# Patient Record
Sex: Female | Born: 1985 | Race: Black or African American | Hispanic: No | Marital: Single | Smoking: Current every day smoker
Health system: Southern US, Community
[De-identification: ages and names within clinical notes are randomized; demographics above are authoritative.]

## PROBLEM LIST (undated history)

## (undated) ENCOUNTER — Inpatient Hospital Stay (HOSPITAL_COMMUNITY): Payer: Self-pay

## (undated) ENCOUNTER — Inpatient Hospital Stay: Admission: EM | Payer: Self-pay | Source: Home / Self Care

## (undated) DIAGNOSIS — R51 Headache: Secondary | ICD-10-CM

## (undated) DIAGNOSIS — N390 Urinary tract infection, site not specified: Secondary | ICD-10-CM

## (undated) DIAGNOSIS — D573 Sickle-cell trait: Secondary | ICD-10-CM

## (undated) DIAGNOSIS — K429 Umbilical hernia without obstruction or gangrene: Secondary | ICD-10-CM

## (undated) DIAGNOSIS — O139 Gestational [pregnancy-induced] hypertension without significant proteinuria, unspecified trimester: Secondary | ICD-10-CM

## (undated) DIAGNOSIS — A599 Trichomoniasis, unspecified: Secondary | ICD-10-CM

## (undated) DIAGNOSIS — K219 Gastro-esophageal reflux disease without esophagitis: Secondary | ICD-10-CM

## (undated) DIAGNOSIS — A749 Chlamydial infection, unspecified: Secondary | ICD-10-CM

## (undated) DIAGNOSIS — O24419 Gestational diabetes mellitus in pregnancy, unspecified control: Secondary | ICD-10-CM

## (undated) HISTORY — PX: ABDOMINAL HYSTERECTOMY: SHX81

## (undated) HISTORY — PX: EXPLORATORY LAPAROTOMY: SUR591

## (undated) HISTORY — PX: ECTOPIC PREGNANCY SURGERY: SHX613

---
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
## 2004-03-25 ENCOUNTER — Emergency Department (HOSPITAL_COMMUNITY): Admission: EM | Admit: 2004-03-25 | Discharge: 2004-03-26 | Payer: Self-pay | Admitting: Emergency Medicine

## 2004-10-25 ENCOUNTER — Emergency Department (HOSPITAL_COMMUNITY): Admission: EM | Admit: 2004-10-25 | Discharge: 2004-10-25 | Payer: Self-pay | Admitting: Emergency Medicine

## 2005-05-09 ENCOUNTER — Emergency Department (HOSPITAL_COMMUNITY): Admission: EM | Admit: 2005-05-09 | Discharge: 2005-05-10 | Payer: Self-pay | Admitting: Emergency Medicine

## 2005-06-03 ENCOUNTER — Emergency Department (HOSPITAL_COMMUNITY): Admission: EM | Admit: 2005-06-03 | Discharge: 2005-06-04 | Payer: Self-pay | Admitting: Emergency Medicine

## 2005-06-11 ENCOUNTER — Emergency Department (HOSPITAL_COMMUNITY): Admission: EM | Admit: 2005-06-11 | Discharge: 2005-06-12 | Payer: Self-pay | Admitting: Emergency Medicine

## 2005-07-15 ENCOUNTER — Other Ambulatory Visit: Admission: RE | Admit: 2005-07-15 | Discharge: 2005-07-15 | Payer: Self-pay | Admitting: Obstetrics & Gynecology

## 2005-09-19 ENCOUNTER — Inpatient Hospital Stay (HOSPITAL_COMMUNITY): Admission: AD | Admit: 2005-09-19 | Discharge: 2005-09-19 | Payer: Self-pay | Admitting: Obstetrics and Gynecology

## 2005-11-16 ENCOUNTER — Inpatient Hospital Stay (HOSPITAL_COMMUNITY): Admission: AD | Admit: 2005-11-16 | Discharge: 2005-11-17 | Payer: Self-pay | Admitting: *Deleted

## 2005-11-19 ENCOUNTER — Inpatient Hospital Stay (HOSPITAL_COMMUNITY): Admission: AD | Admit: 2005-11-19 | Discharge: 2005-11-19 | Payer: Self-pay | Admitting: Obstetrics and Gynecology

## 2005-12-05 ENCOUNTER — Inpatient Hospital Stay (HOSPITAL_COMMUNITY): Admission: AD | Admit: 2005-12-05 | Discharge: 2005-12-05 | Payer: Self-pay | Admitting: Obstetrics and Gynecology

## 2006-01-11 ENCOUNTER — Inpatient Hospital Stay (HOSPITAL_COMMUNITY): Admission: AD | Admit: 2006-01-11 | Discharge: 2006-01-11 | Payer: Self-pay | Admitting: Obstetrics & Gynecology

## 2006-01-21 ENCOUNTER — Inpatient Hospital Stay (HOSPITAL_COMMUNITY): Admission: AD | Admit: 2006-01-21 | Discharge: 2006-01-21 | Payer: Self-pay | Admitting: Obstetrics & Gynecology

## 2006-01-31 ENCOUNTER — Inpatient Hospital Stay (HOSPITAL_COMMUNITY): Admission: AD | Admit: 2006-01-31 | Discharge: 2006-02-02 | Payer: Self-pay | Admitting: Obstetrics and Gynecology

## 2006-04-11 ENCOUNTER — Emergency Department (HOSPITAL_COMMUNITY): Admission: EM | Admit: 2006-04-11 | Discharge: 2006-04-11 | Payer: Self-pay | Admitting: Emergency Medicine

## 2006-04-21 ENCOUNTER — Emergency Department (HOSPITAL_COMMUNITY): Admission: EM | Admit: 2006-04-21 | Discharge: 2006-04-21 | Payer: Self-pay | Admitting: Emergency Medicine

## 2006-07-20 ENCOUNTER — Emergency Department (HOSPITAL_COMMUNITY): Admission: EM | Admit: 2006-07-20 | Discharge: 2006-07-21 | Payer: Self-pay | Admitting: Emergency Medicine

## 2006-10-04 ENCOUNTER — Emergency Department (HOSPITAL_COMMUNITY): Admission: EM | Admit: 2006-10-04 | Discharge: 2006-10-04 | Payer: Self-pay | Admitting: Emergency Medicine

## 2007-04-07 ENCOUNTER — Emergency Department (HOSPITAL_COMMUNITY): Admission: EM | Admit: 2007-04-07 | Discharge: 2007-04-07 | Payer: Self-pay | Admitting: Emergency Medicine

## 2007-06-06 ENCOUNTER — Emergency Department (HOSPITAL_COMMUNITY): Admission: EM | Admit: 2007-06-06 | Discharge: 2007-06-07 | Payer: Self-pay | Admitting: Emergency Medicine

## 2007-07-06 ENCOUNTER — Emergency Department (HOSPITAL_COMMUNITY): Admission: EM | Admit: 2007-07-06 | Discharge: 2007-07-06 | Payer: Self-pay | Admitting: Emergency Medicine

## 2007-11-17 ENCOUNTER — Emergency Department (HOSPITAL_COMMUNITY): Admission: EM | Admit: 2007-11-17 | Discharge: 2007-11-17 | Payer: Self-pay | Admitting: Emergency Medicine

## 2008-01-08 ENCOUNTER — Emergency Department (HOSPITAL_COMMUNITY): Admission: EM | Admit: 2008-01-08 | Discharge: 2008-01-08 | Payer: Self-pay | Admitting: Emergency Medicine

## 2008-06-22 ENCOUNTER — Emergency Department (HOSPITAL_COMMUNITY): Admission: EM | Admit: 2008-06-22 | Discharge: 2008-06-22 | Payer: Self-pay | Admitting: Emergency Medicine

## 2008-09-27 ENCOUNTER — Emergency Department (HOSPITAL_COMMUNITY): Admission: EM | Admit: 2008-09-27 | Discharge: 2008-09-27 | Payer: Self-pay | Admitting: Emergency Medicine

## 2009-02-08 ENCOUNTER — Emergency Department (HOSPITAL_COMMUNITY): Admission: EM | Admit: 2009-02-08 | Discharge: 2009-02-08 | Payer: Self-pay | Admitting: Emergency Medicine

## 2009-07-31 ENCOUNTER — Emergency Department (HOSPITAL_COMMUNITY): Admission: EM | Admit: 2009-07-31 | Discharge: 2009-07-31 | Payer: Self-pay | Admitting: Family Medicine

## 2009-08-03 ENCOUNTER — Emergency Department (HOSPITAL_COMMUNITY): Admission: EM | Admit: 2009-08-03 | Discharge: 2009-08-04 | Payer: Self-pay | Admitting: Emergency Medicine

## 2009-11-06 ENCOUNTER — Emergency Department (HOSPITAL_COMMUNITY): Admission: EM | Admit: 2009-11-06 | Discharge: 2009-11-06 | Payer: Self-pay | Admitting: Emergency Medicine

## 2010-02-11 ENCOUNTER — Emergency Department (HOSPITAL_COMMUNITY): Admission: EM | Admit: 2010-02-11 | Discharge: 2010-02-12 | Payer: Self-pay | Admitting: Emergency Medicine

## 2010-02-12 ENCOUNTER — Ambulatory Visit (HOSPITAL_COMMUNITY): Admission: AD | Admit: 2010-02-12 | Discharge: 2010-02-13 | Payer: Self-pay | Admitting: Obstetrics & Gynecology

## 2010-02-12 ENCOUNTER — Encounter (INDEPENDENT_AMBULATORY_CARE_PROVIDER_SITE_OTHER): Payer: Self-pay | Admitting: Obstetrics and Gynecology

## 2010-02-12 ENCOUNTER — Ambulatory Visit: Payer: Self-pay | Admitting: Nurse Practitioner

## 2010-06-13 ENCOUNTER — Inpatient Hospital Stay (HOSPITAL_COMMUNITY)
Admission: AD | Admit: 2010-06-13 | Discharge: 2010-06-14 | Payer: Self-pay | Source: Home / Self Care | Attending: Obstetrics & Gynecology | Admitting: Obstetrics & Gynecology

## 2010-06-15 ENCOUNTER — Inpatient Hospital Stay (HOSPITAL_COMMUNITY)
Admission: AD | Admit: 2010-06-15 | Discharge: 2010-06-15 | Payer: Self-pay | Source: Home / Self Care | Attending: Obstetrics and Gynecology | Admitting: Obstetrics and Gynecology

## 2010-06-18 ENCOUNTER — Inpatient Hospital Stay (HOSPITAL_COMMUNITY)
Admission: AD | Admit: 2010-06-18 | Discharge: 2010-06-18 | Payer: Self-pay | Source: Home / Self Care | Attending: Obstetrics & Gynecology | Admitting: Obstetrics & Gynecology

## 2010-06-20 ENCOUNTER — Inpatient Hospital Stay (HOSPITAL_COMMUNITY)
Admission: RE | Admit: 2010-06-20 | Discharge: 2010-06-20 | Payer: Self-pay | Source: Home / Self Care | Attending: Obstetrics & Gynecology | Admitting: Obstetrics & Gynecology

## 2010-06-24 NOTE — L&D Delivery Note (Signed)
Delivery Note At 2:27 PM a viable and healthy female was delivered via Vaginal, Spontaneous Delivery (Presentation: Left Occiput Anterior).  APGAR: 9, 9; weight 8 lb 6.6 oz (3816 g).   Placenta status: Intact, Spontaneous.  Cord: 3 vessels.  Anesthesia: Epidural  Episiotomy: None Lacerations: None Est. Blood Loss (mL): 300  Mom to postpartum.  Baby to nursery-stable.  Leaira Fullam D 02/13/2011, 5:00 PM

## 2010-06-27 ENCOUNTER — Inpatient Hospital Stay (HOSPITAL_COMMUNITY)
Admission: AD | Admit: 2010-06-27 | Discharge: 2010-06-27 | Payer: Self-pay | Source: Home / Self Care | Attending: Obstetrics & Gynecology | Admitting: Obstetrics & Gynecology

## 2010-06-27 LAB — URINALYSIS, ROUTINE W REFLEX MICROSCOPIC
Bilirubin Urine: NEGATIVE
Hemoglobin, Urine: NEGATIVE
Ketones, ur: NEGATIVE mg/dL
Nitrite: NEGATIVE
Protein, ur: NEGATIVE mg/dL
Specific Gravity, Urine: 1.02 (ref 1.005–1.030)
Urine Glucose, Fasting: NEGATIVE mg/dL
Urobilinogen, UA: 0.2 mg/dL (ref 0.0–1.0)
pH: 6 (ref 5.0–8.0)

## 2010-06-27 LAB — URINE MICROSCOPIC-ADD ON

## 2010-07-26 ENCOUNTER — Inpatient Hospital Stay (HOSPITAL_COMMUNITY)
Admission: AD | Admit: 2010-07-26 | Discharge: 2010-07-26 | Disposition: A | Discharge status: Home / Self Care | Payer: Medicaid Other | Source: Ambulatory Visit | Attending: Family Medicine | Admitting: Family Medicine

## 2010-07-26 DIAGNOSIS — R109 Unspecified abdominal pain: Secondary | ICD-10-CM | POA: Insufficient documentation

## 2010-07-26 DIAGNOSIS — O99891 Other specified diseases and conditions complicating pregnancy: Secondary | ICD-10-CM | POA: Insufficient documentation

## 2010-07-26 DIAGNOSIS — E860 Dehydration: Secondary | ICD-10-CM

## 2010-07-26 DIAGNOSIS — O9989 Other specified diseases and conditions complicating pregnancy, childbirth and the puerperium: Secondary | ICD-10-CM

## 2010-07-26 LAB — URINALYSIS, ROUTINE W REFLEX MICROSCOPIC
Bilirubin Urine: NEGATIVE
Hgb urine dipstick: NEGATIVE
Ketones, ur: 15 mg/dL — AB
Nitrite: NEGATIVE
Protein, ur: NEGATIVE mg/dL
Specific Gravity, Urine: 1.02 (ref 1.005–1.030)
Urine Glucose, Fasting: NEGATIVE mg/dL
Urobilinogen, UA: 1 mg/dL (ref 0.0–1.0)
pH: 7 (ref 5.0–8.0)

## 2010-07-26 LAB — URINE MICROSCOPIC-ADD ON

## 2010-07-26 LAB — WET PREP, GENITAL
Clue Cells Wet Prep HPF POC: NONE SEEN
Trich, Wet Prep: NONE SEEN
Yeast Wet Prep HPF POC: NONE SEEN

## 2010-07-27 LAB — URINE CULTURE
Colony Count: >=100000
Culture  Setup Time: 201202021501

## 2010-07-27 LAB — GC/CHLAMYDIA PROBE AMP, GENITAL
Chlamydia, DNA Probe: NEGATIVE
GC Probe Amp, Genital: NEGATIVE

## 2010-08-13 ENCOUNTER — Inpatient Hospital Stay (HOSPITAL_COMMUNITY)
Admission: AD | Admit: 2010-08-13 | Discharge: 2010-08-13 | Disposition: A | Discharge status: Home / Self Care | Payer: Medicaid Other | Source: Ambulatory Visit | Attending: Obstetrics and Gynecology | Admitting: Obstetrics and Gynecology

## 2010-08-13 DIAGNOSIS — O99891 Other specified diseases and conditions complicating pregnancy: Secondary | ICD-10-CM | POA: Insufficient documentation

## 2010-08-13 DIAGNOSIS — O9989 Other specified diseases and conditions complicating pregnancy, childbirth and the puerperium: Secondary | ICD-10-CM

## 2010-08-13 DIAGNOSIS — R109 Unspecified abdominal pain: Secondary | ICD-10-CM

## 2010-08-13 LAB — URINALYSIS, ROUTINE W REFLEX MICROSCOPIC
Bilirubin Urine: NEGATIVE
Hgb urine dipstick: NEGATIVE
Ketones, ur: NEGATIVE mg/dL
Nitrite: NEGATIVE
Protein, ur: NEGATIVE mg/dL
Specific Gravity, Urine: >1.03 — ABNORMAL HIGH (ref 1.005–1.030)
Urine Glucose, Fasting: NEGATIVE mg/dL
Urobilinogen, UA: 0.2 mg/dL (ref 0.0–1.0)
pH: 6 (ref 5.0–8.0)

## 2010-08-13 LAB — COMPREHENSIVE METABOLIC PANEL
ALT: 9 U/L (ref 0–35)
AST: 14 U/L (ref 0–37)
Albumin: 3.4 g/dL — ABNORMAL LOW (ref 3.5–5.2)
Alkaline Phosphatase: 44 U/L (ref 39–117)
BUN: 9 mg/dL (ref 6–23)
CO2: 23 mEq/L (ref 19–32)
Calcium: 9.1 mg/dL (ref 8.4–10.5)
Chloride: 106 mEq/L (ref 96–112)
Creatinine, Ser: 0.62 mg/dL (ref 0.4–1.2)
GFR calc Af Amer: >60 mL/min (ref >60)
GFR calc non Af Amer: >60 mL/min (ref >60)
Glucose, Bld: 104 mg/dL — ABNORMAL HIGH (ref 70–99)
Potassium: 3.5 mEq/L (ref 3.5–5.1)
Sodium: 135 mEq/L (ref 135–145)
Total Bilirubin: 0.4 mg/dL (ref 0.3–1.2)
Total Protein: 6.6 g/dL (ref 6.0–8.3)

## 2010-08-13 LAB — AMYLASE: Amylase: 55 U/L (ref 0–105)

## 2010-08-13 LAB — CBC
HCT: 34.5 % — ABNORMAL LOW (ref 36.0–46.0)
Hemoglobin: 12.6 g/dL (ref 12.0–15.0)
WBC: 9.8 10*3/uL (ref 4.0–10.5)

## 2010-08-13 LAB — LIPASE, BLOOD: Lipase: 19 U/L (ref 11–59)

## 2010-09-03 LAB — CBC
HCT: 33.4 % — ABNORMAL LOW (ref 36.0–46.0)
Hemoglobin: 12.4 g/dL (ref 12.0–15.0)
MCHC: 37.1 g/dL — ABNORMAL HIGH (ref 30.0–36.0)

## 2010-09-03 LAB — DIFFERENTIAL
Basophils Relative: 0 % (ref 0–1)
Eosinophils Absolute: 0 10*3/uL (ref 0.0–0.7)
Eosinophils Relative: 0 % (ref 0–5)
Lymphocytes Relative: 40 % (ref 12–46)
Neutro Abs: 5.4 10*3/uL (ref 1.7–7.7)

## 2010-09-03 LAB — URINALYSIS, ROUTINE W REFLEX MICROSCOPIC
Glucose, UA: NEGATIVE mg/dL
Hgb urine dipstick: NEGATIVE
Specific Gravity, Urine: 1.025 (ref 1.005–1.030)
Urobilinogen, UA: 1 mg/dL (ref 0.0–1.0)

## 2010-09-03 LAB — HCG, QUANTITATIVE, PREGNANCY: hCG, Beta Chain, Quant, S: 3850 m[IU]/mL — ABNORMAL HIGH (ref <5)

## 2010-09-03 LAB — POCT PREGNANCY, URINE: Preg Test, Ur: POSITIVE

## 2010-09-03 LAB — GC/CHLAMYDIA PROBE AMP, GENITAL: GC Probe Amp, Genital: NEGATIVE

## 2010-09-03 LAB — WET PREP, GENITAL
Trich, Wet Prep: NONE SEEN
Yeast Wet Prep HPF POC: NONE SEEN

## 2010-09-03 LAB — ABO/RH: ABO/RH(D): O POS

## 2010-09-06 LAB — ABO/RH: ABO/RH(D): O POS

## 2010-09-06 LAB — WET PREP, GENITAL: Trich, Wet Prep: NONE SEEN

## 2010-09-06 LAB — CBC: Platelets: 194 10*3/uL (ref 150–400)

## 2010-09-06 LAB — HCG, QUANTITATIVE, PREGNANCY: hCG, Beta Chain, Quant, S: 468 m[IU]/mL — ABNORMAL HIGH (ref <5)

## 2010-09-06 LAB — BASIC METABOLIC PANEL
BUN: 8 mg/dL (ref 6–23)
CO2: 18 mEq/L — ABNORMAL LOW (ref 19–32)
Calcium: 8.8 mg/dL (ref 8.4–10.5)
Creatinine, Ser: 0.64 mg/dL (ref 0.4–1.2)
Glucose, Bld: 84 mg/dL (ref 70–99)

## 2010-09-06 LAB — URINALYSIS, ROUTINE W REFLEX MICROSCOPIC
Hgb urine dipstick: NEGATIVE
Nitrite: NEGATIVE
Specific Gravity, Urine: 1.029 (ref 1.005–1.030)
Urobilinogen, UA: 1 mg/dL (ref 0.0–1.0)

## 2010-09-06 LAB — DIFFERENTIAL
Basophils Relative: 0 % (ref 0–1)
Eosinophils Absolute: 0 10*3/uL (ref 0.0–0.7)
Eosinophils Relative: 0 % (ref 0–5)
Neutrophils Relative %: 64 % (ref 43–77)

## 2010-09-06 LAB — URINE MICROSCOPIC-ADD ON

## 2010-09-06 LAB — POCT PREGNANCY, URINE: Preg Test, Ur: POSITIVE

## 2010-09-07 LAB — CBC
MCH: 31.2 pg (ref 26.0–34.0)
MCH: 31.9 pg (ref 26.0–34.0)
MCHC: 35.1 g/dL (ref 30.0–36.0)
MCV: 92.7 fL (ref 78.0–100.0)
Platelets: 175 10*3/uL (ref 150–400)
Platelets: 188 10*3/uL (ref 150–400)
RBC: 3.97 MIL/uL (ref 3.87–5.11)
RBC: 4.32 MIL/uL (ref 3.87–5.11)
RDW: 13.2 % (ref 11.5–15.5)
RDW: 13.2 % (ref 11.5–15.5)

## 2010-09-07 LAB — HCG, QUANTITATIVE, PREGNANCY: hCG, Beta Chain, Quant, S: 865 m[IU]/mL — ABNORMAL HIGH (ref <5)

## 2010-09-07 LAB — TYPE AND SCREEN
ABO/RH(D): O POS
Antibody Screen: NEGATIVE

## 2010-09-10 LAB — WET PREP, GENITAL: Trich, Wet Prep: NONE SEEN

## 2010-09-10 LAB — CBC
HCT: 43.3 % (ref 36.0–46.0)
Platelets: 192 10*3/uL (ref 150–400)
RDW: 13.2 % (ref 11.5–15.5)
WBC: 7.2 10*3/uL (ref 4.0–10.5)

## 2010-09-10 LAB — GC/CHLAMYDIA PROBE AMP, GENITAL
Chlamydia, DNA Probe: NEGATIVE
GC Probe Amp, Genital: NEGATIVE

## 2010-09-10 LAB — ABO/RH: ABO/RH(D): O POS

## 2010-09-10 LAB — URINALYSIS, ROUTINE W REFLEX MICROSCOPIC
Bilirubin Urine: NEGATIVE
Nitrite: NEGATIVE
Protein, ur: NEGATIVE mg/dL
Specific Gravity, Urine: 1.014 (ref 1.005–1.030)
Urobilinogen, UA: 1 mg/dL (ref 0.0–1.0)

## 2010-09-10 LAB — DIFFERENTIAL
Basophils Absolute: 0 10*3/uL (ref 0.0–0.1)
Lymphocytes Relative: 34 % (ref 12–46)
Lymphs Abs: 2.4 10*3/uL (ref 0.7–4.0)
Neutro Abs: 4.3 10*3/uL (ref 1.7–7.7)
Neutrophils Relative %: 60 % (ref 43–77)

## 2010-09-10 LAB — POCT I-STAT, CHEM 8
BUN: 3 mg/dL — ABNORMAL LOW (ref 6–23)
Chloride: 109 mEq/L (ref 96–112)
HCT: 46 % (ref 36.0–46.0)
Potassium: 3.9 mEq/L (ref 3.5–5.1)
Sodium: 141 mEq/L (ref 135–145)

## 2010-09-10 LAB — URINE MICROSCOPIC-ADD ON

## 2010-09-12 LAB — CBC
HCT: 45.5 % (ref 36.0–46.0)
Hemoglobin: 16 g/dL — ABNORMAL HIGH (ref 12.0–15.0)
Hemoglobin: 17 g/dL — ABNORMAL HIGH (ref 12.0–15.0)
MCHC: 34.5 g/dL (ref 30.0–36.0)
MCV: 89.5 fL (ref 78.0–100.0)
Platelets: 209 10*3/uL (ref 150–400)
RBC: 5.08 MIL/uL (ref 3.87–5.11)
RBC: 5.31 MIL/uL — ABNORMAL HIGH (ref 3.87–5.11)
WBC: 8.9 10*3/uL (ref 4.0–10.5)
WBC: 9.7 10*3/uL (ref 4.0–10.5)

## 2010-09-12 LAB — POCT URINALYSIS DIP (DEVICE)
Bilirubin Urine: NEGATIVE
Glucose, UA: NEGATIVE mg/dL
Ketones, ur: NEGATIVE mg/dL
Nitrite: NEGATIVE
pH: 7.5 (ref 5.0–8.0)

## 2010-09-12 LAB — DIFFERENTIAL
Lymphocytes Relative: 36 % (ref 12–46)
Lymphs Abs: 3.2 10*3/uL (ref 0.7–4.0)
Monocytes Absolute: 0.5 10*3/uL (ref 0.1–1.0)
Monocytes Relative: 6 % (ref 3–12)
Neutro Abs: 5.1 10*3/uL (ref 1.7–7.7)
Neutrophils Relative %: 57 % (ref 43–77)

## 2010-09-12 LAB — GC/CHLAMYDIA PROBE AMP, GENITAL: Chlamydia, DNA Probe: NEGATIVE

## 2010-09-14 ENCOUNTER — Other Ambulatory Visit: Payer: Self-pay | Admitting: Obstetrics & Gynecology

## 2010-09-14 LAB — ABO/RH: RH Type: POSITIVE

## 2010-09-14 LAB — HIV ANTIBODY (ROUTINE TESTING W REFLEX): HIV: NONREACTIVE

## 2010-10-03 LAB — URINE MICROSCOPIC-ADD ON

## 2010-10-03 LAB — POCT I-STAT, CHEM 8
Calcium, Ion: 1.19 mmol/L (ref 1.12–1.32)
Chloride: 107 mEq/L (ref 96–112)
Glucose, Bld: 84 mg/dL (ref 70–99)
HCT: 45 % (ref 36.0–46.0)
TCO2: 23 mmol/L (ref 0–100)

## 2010-10-03 LAB — URINALYSIS, ROUTINE W REFLEX MICROSCOPIC
Bilirubin Urine: NEGATIVE
Glucose, UA: NEGATIVE mg/dL
Hgb urine dipstick: NEGATIVE
Nitrite: NEGATIVE
Protein, ur: NEGATIVE mg/dL
Specific Gravity, Urine: 1.015 (ref 1.005–1.030)
pH: 7 (ref 5.0–8.0)

## 2010-12-11 ENCOUNTER — Inpatient Hospital Stay (HOSPITAL_COMMUNITY)
Admission: AD | Admit: 2010-12-11 | Discharge: 2010-12-12 | Disposition: A | Discharge status: Home / Self Care | Payer: Medicaid Other | Source: Ambulatory Visit | Attending: Obstetrics and Gynecology | Admitting: Obstetrics and Gynecology

## 2010-12-11 DIAGNOSIS — O4700 False labor before 37 completed weeks of gestation, unspecified trimester: Secondary | ICD-10-CM | POA: Insufficient documentation

## 2010-12-12 LAB — URINALYSIS, ROUTINE W REFLEX MICROSCOPIC
Bilirubin Urine: NEGATIVE
Glucose, UA: NEGATIVE mg/dL
Ketones, ur: NEGATIVE mg/dL
Protein, ur: NEGATIVE mg/dL
pH: 6.5 (ref 5.0–8.0)

## 2010-12-19 ENCOUNTER — Encounter: Payer: Medicaid Other | Attending: Obstetrics and Gynecology

## 2011-01-07 ENCOUNTER — Emergency Department (HOSPITAL_COMMUNITY)
Admission: EM | Admit: 2011-01-07 | Discharge: 2011-01-07 | Disposition: A | Discharge status: Home / Self Care | Payer: Medicaid Other | Attending: Emergency Medicine | Admitting: Emergency Medicine

## 2011-01-07 DIAGNOSIS — O9989 Other specified diseases and conditions complicating pregnancy, childbirth and the puerperium: Secondary | ICD-10-CM | POA: Insufficient documentation

## 2011-01-07 DIAGNOSIS — J45909 Unspecified asthma, uncomplicated: Secondary | ICD-10-CM | POA: Insufficient documentation

## 2011-01-09 ENCOUNTER — Encounter: Payer: Medicaid Other | Attending: Obstetrics and Gynecology

## 2011-01-09 DIAGNOSIS — O99810 Abnormal glucose complicating pregnancy: Secondary | ICD-10-CM | POA: Insufficient documentation

## 2011-01-09 DIAGNOSIS — Z713 Dietary counseling and surveillance: Secondary | ICD-10-CM | POA: Insufficient documentation

## 2011-01-09 NOTE — Progress Notes (Signed)
  Patient was seen on 01/09/2011 for Gestational Diabetes self-management class at the [facility]. The following learning objectives were met by the patient during this course:   States the definition of Gestational Diabetes  States why dietary management is important in controlling blood glucose  Describes the effects each nutrient has on blood glucose levels  Demonstrates ability to create a balanced meal plan  Demonstrates carbohydrate counting   States when to check blood glucose levels  Demonstrates proper blood glucose monitoring techniques  States the effect of stress and exercise on blood glucose levels  States the importance of limiting caffeine and abstaining from alcohol and smoking  Blood glucose monitor given: Accu-Chek Nano Lot # [id number] Exp: 04/24/2011 Blood glucose reading: 104 at 10:50 a.m.  Patient instructed to monitor glucose levels: FBS: 60 - <90 1 hour: <140 2 hour: <120  Patient will be seen for follow-up as needed.

## 2011-02-03 ENCOUNTER — Inpatient Hospital Stay (HOSPITAL_COMMUNITY)
Admission: AD | Admit: 2011-02-03 | Discharge: 2011-02-03 | Disposition: A | Discharge status: Home / Self Care | Payer: Medicaid Other | Source: Ambulatory Visit | Attending: Obstetrics and Gynecology | Admitting: Obstetrics and Gynecology

## 2011-02-03 ENCOUNTER — Encounter (HOSPITAL_COMMUNITY): Payer: Self-pay

## 2011-02-03 DIAGNOSIS — O479 False labor, unspecified: Secondary | ICD-10-CM | POA: Insufficient documentation

## 2011-02-03 HISTORY — DX: Gestational diabetes mellitus in pregnancy, unspecified control: O24.419

## 2011-02-03 HISTORY — DX: Umbilical hernia without obstruction or gangrene: K42.9

## 2011-02-03 NOTE — Progress Notes (Signed)
Lost mucus plug a couple of nights ago, thinks started leaking fluid intermittently, having sharp pains like cramps and lower back pain x 3 days

## 2011-02-03 NOTE — ED Provider Notes (Signed)
Chief Complaint:  Contractions and Rupture of Membranes   Christy Jones is  25 y.o. [id number].  No LMP recorded. Patient is pregnant..  Her pregnancy status is positive.  She presents complaining of Contractions and Rupture of Membranes . Onset is described as leaking fluid for 4 days, now having contratcions    Past Medical History  Diagnosis Date  . Asthma   . Umbilical hernia   . Gestational diabetes     Past Surgical History  Procedure Date  . Exploratory laparotomy     removal of ectopic pregnancy    No family history on file.  History  Substance Use Topics  . Smoking status: Current Some Day Smoker  . Smokeless tobacco: Not on file  . Alcohol Use: No    Allergies: Allergies not on file  No prescriptions prior to admission    Review of Systems - Negative except stated in chief complaint  Physical Exam   Blood pressure 113/79, pulse 90, temperature 98.7 F (37.1 C), temperature source Oral, resp. rate 16, height 5\' 4"  (1.626 m), weight 79.924 kg (176 lb 3.2 oz).  General: General appearance - alert, well appearing, and in no distress, oriented to person, place, and time and well hydrated Focused Gynecological Exam: normal external genitalia, vulva, vagina, cervix, uterus and adnexa, No pooling, negative valsalva.  WET MOUNT done - results: negative for pathogens, normal epithelial cells; FERN negative CX 3/50/-2 (no change per pt) FHR 145, avg LTV, frequent accels, no decels.  Mild and irregular ctx, q 3-8 minutes Labs: No results found for this or any previous visit (from the past 24 hour(s)). Imaging Studies:  No results found.   Assessment: Negative ROM, normal appearing discharge.  Braxton Hicks contractions  Plan: D/C home  CRESENZO-DISHMAN,Carolynne Schuchard

## 2011-02-03 NOTE — Progress Notes (Signed)
No pooling noted by CNM.

## 2011-02-03 NOTE — Progress Notes (Signed)
nottified of neg fern and SVE. OK to d/c home.

## 2011-02-11 ENCOUNTER — Encounter (HOSPITAL_COMMUNITY): Payer: Self-pay | Admitting: *Deleted

## 2011-02-11 ENCOUNTER — Telehealth (HOSPITAL_COMMUNITY): Payer: Self-pay | Admitting: *Deleted

## 2011-02-11 NOTE — Telephone Encounter (Signed)
pread screen 

## 2011-02-12 ENCOUNTER — Telehealth (HOSPITAL_COMMUNITY): Payer: Self-pay | Admitting: *Deleted

## 2011-02-12 ENCOUNTER — Other Ambulatory Visit: Payer: Self-pay | Admitting: Obstetrics & Gynecology

## 2011-02-13 ENCOUNTER — Inpatient Hospital Stay (HOSPITAL_COMMUNITY)
Admission: RE | Admit: 2011-02-13 | Discharge: 2011-02-15 | DRG: 775 | Disposition: A | Discharge status: Home / Self Care | Payer: Medicaid Other | Source: Ambulatory Visit | Attending: Obstetrics & Gynecology | Admitting: Obstetrics & Gynecology

## 2011-02-13 ENCOUNTER — Encounter (HOSPITAL_COMMUNITY): Payer: Self-pay

## 2011-02-13 ENCOUNTER — Inpatient Hospital Stay (HOSPITAL_COMMUNITY): Payer: Medicaid Other | Admitting: Anesthesiology

## 2011-02-13 ENCOUNTER — Encounter (HOSPITAL_COMMUNITY): Payer: Self-pay | Admitting: Anesthesiology

## 2011-02-13 DIAGNOSIS — O99814 Abnormal glucose complicating childbirth: Principal | ICD-10-CM | POA: Diagnosis present

## 2011-02-13 DIAGNOSIS — Z331 Pregnant state, incidental: Secondary | ICD-10-CM

## 2011-02-13 LAB — CBC
HCT: 30 % — ABNORMAL LOW (ref 36.0–46.0)
Hemoglobin: 10.9 g/dL — ABNORMAL LOW (ref 12.0–15.0)
MCH: 29.5 pg (ref 26.0–34.0)
MCHC: 36.3 g/dL — ABNORMAL HIGH (ref 30.0–36.0)

## 2011-02-13 MED ORDER — LIDOCAINE HCL 1.5 % IJ SOLN
INTRAMUSCULAR | Status: DC | PRN
  Administered 2011-02-13 (×2): 5 mL via EPIDURAL

## 2011-02-13 MED ORDER — OXYTOCIN 20 UNITS IN LACTATED RINGERS INFUSION - SIMPLE: 125.0000 mL/h | INTRAVENOUS | Status: DC

## 2011-02-13 MED ORDER — ONDANSETRON HCL 4 MG/2ML IJ SOLN: 4.0000 mg | INTRAMUSCULAR | Status: DC | PRN

## 2011-02-13 MED ORDER — WITCH HAZEL-GLYCERIN EX PADS: 1.0000 "application " | MEDICATED_PAD | CUTANEOUS | Status: DC | PRN

## 2011-02-13 MED ORDER — FLEET ENEMA 7-19 GM/118ML RE ENEM: 1.0000 | ENEMA | RECTAL | Status: DC | PRN

## 2011-02-13 MED ORDER — DIBUCAINE 1 % RE OINT: 1.0000 "application " | TOPICAL_OINTMENT | RECTAL | Status: DC | PRN

## 2011-02-13 MED ORDER — LIDOCAINE-EPINEPHRINE (PF) 2 %-1:200000 IJ SOLN
INTRAMUSCULAR | Status: DC | PRN
  Administered 2011-02-13: 5 mL

## 2011-02-13 MED ORDER — OXYCODONE-ACETAMINOPHEN 5-325 MG PO TABS: 2.0000 | ORAL_TABLET | ORAL | Status: DC | PRN

## 2011-02-13 MED ORDER — ONDANSETRON HCL 4 MG PO TABS: 4.0000 mg | ORAL_TABLET | ORAL | Status: DC | PRN

## 2011-02-13 MED ORDER — IBUPROFEN 600 MG PO TABS: 600.0000 mg | ORAL_TABLET | Freq: Four times a day (QID) | ORAL | Status: DC | PRN

## 2011-02-13 MED ORDER — LACTATED RINGERS IV SOLN
500.0000 mL | Freq: Once | INTRAVENOUS | Status: AC
  Administered 2011-02-13: 500 mL via INTRAVENOUS

## 2011-02-13 MED ORDER — FENTANYL 2.5 MCG/ML BUPIVACAINE 1/10 % EPIDURAL INFUSION (WH - ANES)
14.0000 mL/h | INTRAMUSCULAR | Status: DC
  Administered 2011-02-13: 14 mL/h via EPIDURAL
  Filled 2011-02-13 (×2): qty 60

## 2011-02-13 MED ORDER — CITRIC ACID-SODIUM CITRATE 334-500 MG/5ML PO SOLN: 30.0000 mL | ORAL | Status: DC | PRN

## 2011-02-13 MED ORDER — BENZOCAINE-MENTHOL 20-0.5 % EX AERO: 1.0000 "application " | INHALATION_SPRAY | CUTANEOUS | Status: DC | PRN

## 2011-02-13 MED ORDER — OXYTOCIN 20 UNITS IN LACTATED RINGERS INFUSION - SIMPLE
1.0000 m[IU]/min | INTRAVENOUS | Status: DC
  Administered 2011-02-13: 333 m[IU]/min via INTRAVENOUS
  Administered 2011-02-13: 2 m[IU]/min via INTRAVENOUS

## 2011-02-13 MED ORDER — PHENYLEPHRINE 40 MCG/ML (10ML) SYRINGE FOR IV PUSH (FOR BLOOD PRESSURE SUPPORT)
80.0000 ug | PREFILLED_SYRINGE | INTRAVENOUS | Status: DC | PRN
  Filled 2011-02-13: qty 5

## 2011-02-13 MED ORDER — IBUPROFEN 600 MG PO TABS
600.0000 mg | ORAL_TABLET | Freq: Four times a day (QID) | ORAL | Status: DC
  Administered 2011-02-13 – 2011-02-15 (×7): 600 mg via ORAL
  Filled 2011-02-13 (×7): qty 1

## 2011-02-13 MED ORDER — OXYCODONE-ACETAMINOPHEN 5-325 MG PO TABS
1.0000 | ORAL_TABLET | ORAL | Status: DC | PRN
Start: 2011-02-13 — End: 2011-02-15
  Administered 2011-02-13 – 2011-02-14 (×2): 1 via ORAL
  Administered 2011-02-15: 2 via ORAL
  Filled 2011-02-13: qty 1
  Filled 2011-02-13: qty 2
  Filled 2011-02-13: qty 1

## 2011-02-13 MED ORDER — ONDANSETRON HCL 4 MG/2ML IJ SOLN: 4.0000 mg | Freq: Four times a day (QID) | INTRAMUSCULAR | Status: DC | PRN

## 2011-02-13 MED ORDER — TETANUS-DIPHTH-ACELL PERTUSSIS 5-2.5-18.5 LF-MCG/0.5 IM SUSP
0.5000 mL | Freq: Once | INTRAMUSCULAR | Status: AC
  Administered 2011-02-14: 0.5 mL via INTRAMUSCULAR
  Filled 2011-02-13: qty 0.5

## 2011-02-13 MED ORDER — SENNOSIDES-DOCUSATE SODIUM 8.6-50 MG PO TABS
2.0000 | ORAL_TABLET | Freq: Every day | ORAL | Status: DC
  Administered 2011-02-13 – 2011-02-14 (×2): 2 via ORAL

## 2011-02-13 MED ORDER — DIPHENHYDRAMINE HCL 50 MG/ML IJ SOLN: 12.5000 mg | INTRAMUSCULAR | Status: DC | PRN

## 2011-02-13 MED ORDER — ZOLPIDEM TARTRATE 5 MG PO TABS: 5.0000 mg | ORAL_TABLET | Freq: Every evening | ORAL | Status: DC | PRN

## 2011-02-13 MED ORDER — TERBUTALINE SULFATE 1 MG/ML IJ SOLN: 0.2500 mg | Freq: Once | INTRAMUSCULAR | Status: DC | PRN

## 2011-02-13 MED ORDER — DIPHENHYDRAMINE HCL 25 MG PO CAPS
25.0000 mg | ORAL_CAPSULE | Freq: Four times a day (QID) | ORAL | Status: DC | PRN
Start: 2011-02-13 — End: 2011-02-15

## 2011-02-13 MED ORDER — PRENATAL PLUS 27-1 MG PO TABS
1.0000 | ORAL_TABLET | Freq: Every day | ORAL | Status: DC
  Administered 2011-02-14 – 2011-02-15 (×2): 1 via ORAL
  Filled 2011-02-13 (×2): qty 1

## 2011-02-13 MED ORDER — ACETAMINOPHEN 325 MG PO TABS: 650.0000 mg | ORAL_TABLET | ORAL | Status: DC | PRN

## 2011-02-13 MED ORDER — OXYTOCIN BOLUS FROM INFUSION
500.0000 mL | Freq: Once | INTRAVENOUS | Status: DC
  Filled 2011-02-13: qty 1000
  Filled 2011-02-13: qty 500

## 2011-02-13 MED ORDER — PHENYLEPHRINE 40 MCG/ML (10ML) SYRINGE FOR IV PUSH (FOR BLOOD PRESSURE SUPPORT)
80.0000 ug | PREFILLED_SYRINGE | INTRAVENOUS | Status: DC | PRN
  Filled 2011-02-13 (×3): qty 5

## 2011-02-13 MED ORDER — FENTANYL 2.5 MCG/ML BUPIVACAINE 1/10 % EPIDURAL INFUSION (WH - ANES)
INTRAMUSCULAR | Status: DC | PRN
  Administered 2011-02-13: 14 mL/h via EPIDURAL

## 2011-02-13 MED ORDER — EPHEDRINE 5 MG/ML INJ
10.0000 mg | INTRAVENOUS | Status: DC | PRN
Start: 2011-02-13 — End: 2011-02-13
  Filled 2011-02-13: qty 4

## 2011-02-13 MED ORDER — SIMETHICONE 80 MG PO CHEW: 80.0000 mg | CHEWABLE_TABLET | ORAL | Status: DC | PRN

## 2011-02-13 MED ORDER — LIDOCAINE HCL (PF) 1 % IJ SOLN
30.0000 mL | INTRAMUSCULAR | Status: DC | PRN
  Filled 2011-02-13: qty 30

## 2011-02-13 MED ORDER — EPHEDRINE 5 MG/ML INJ
10.0000 mg | INTRAVENOUS | Status: DC | PRN
  Filled 2011-02-13 (×3): qty 4

## 2011-02-13 MED ORDER — LANOLIN HYDROUS EX OINT: TOPICAL_OINTMENT | CUTANEOUS | Status: DC | PRN

## 2011-02-13 MED ORDER — LACTATED RINGERS IV SOLN: 500.0000 mL | INTRAVENOUS | Status: DC | PRN

## 2011-02-13 MED ORDER — LACTATED RINGERS IV SOLN
INTRAVENOUS | Status: DC
  Administered 2011-02-13 (×2): via INTRAVENOUS

## 2011-02-13 NOTE — Anesthesia Postprocedure Evaluation (Signed)
Anesthesia Post Note  Patient: Christy Jones  Procedure(s) Performed: * No procedures listed *  Anesthesia type: Epidural  Patient location: Mother/Baby  Post pain: Pain level controlled  Post assessment: Post-op Vital signs reviewed  Last Vitals:  Filed Vitals:   02/13/11 1446  BP: 133/75  Pulse: 68  Temp:   Resp:     Post vital signs: Reviewed  Level of consciousness: awake  Complications: No apparent anesthesia complications

## 2011-02-13 NOTE — Anesthesia Preprocedure Evaluation (Signed)
Anesthesia Evaluation  Name, MR# and DOB Patient awake  General Assessment Comment  Reviewed: Allergy & Precautions, H&P , NPO status , Patient's Chart, lab work & pertinent test results  Airway Mallampati: II TM Distance: >3 FB Neck ROM: full    Dental No notable dental hx.    Pulmonary  clear to auscultation  pulmonary exam normalPulmonary Exam Normal breath sounds clear to auscultation none    Cardiovascular regular Normal    Neuro/Psych Negative Neurological ROS  Negative Psych ROS  GI/Hepatic/Renal negative GI ROS  negative Liver ROS  negative Renal ROS        Endo/Other    Abdominal   Musculoskeletal negative musculoskeletal ROS (+)   Hematology negative hematology ROS (+)   Peds  Reproductive/Obstetrics (+) Pregnancy    Anesthesia Other Findings             Anesthesia Physical Anesthesia Plan  ASA: II  Anesthesia Plan: Epidural   Post-op Pain Management:    Induction:   Airway Management Planned:   Additional Equipment:   Intra-op Plan:   Post-operative Plan:   Informed Consent: I have reviewed the patients History and Physical, chart, labs and discussed the procedure including the risks, benefits and alternatives for the proposed anesthesia with the patient or authorized representative who has indicated his/her understanding and acceptance.     Plan Discussed with:   Anesthesia Plan Comments:         Anesthesia Quick Evaluation

## 2011-02-13 NOTE — H&P (Signed)
  25 y.o. [id number]  Estimated Date of Delivery: 02/17/11 admitted at [redacted] weeks gestation for INDUCTION. Prenatal course complicated by: GDM  Prenatal labs: Blood Type:O+.  Screening tests for HIV, Syphilis, Hepatitis B, perineal group B strep colonization, and Rubella sensitivity were all negative.   Pt. began prenatal care at 18 weeks and did not get fetal screens.   Afebrile, VSS Heart and Lungs: No active disease Abdomen: soft, gravid, EFW 8.5 lbs. Cervical exam:  3-4/80, Vtx. -2.  Impression: INDUCTION  Plan:  IV pitocin augmentation  AROM (clear)

## 2011-02-13 NOTE — Anesthesia Procedure Notes (Signed)
Epidural Patient location during procedure: OB Start time: 02/13/2011 9:18 AM End time: 02/13/2011 9:27 AM Reason for block: procedure for pain  Staffing Anesthesiologist: Sandrea Hughs Performed by: anesthesiologist   Preanesthetic Checklist Completed: patient identified, site marked, surgical consent, pre-op evaluation, timeout performed, IV checked, risks and benefits discussed and monitors and equipment checked  Epidural Patient position: sitting Prep: site prepped and draped and DuraPrep Patient monitoring: continuous pulse ox and blood pressure Approach: midline Injection technique: LOR air  Needle:  Needle type: Tuohy  Needle gauge: 17 G Needle length: 9 cm Needle insertion depth: 7 cm Catheter type: closed end flexible Catheter size: 19 Gauge Catheter at skin depth: 11 cm Test dose: negative  Assessment Sensory level: T8 Events: blood not aspirated, injection not painful, no injection resistance, negative IV test and no paresthesia

## 2011-02-14 LAB — CBC
MCHC: 35.2 g/dL (ref 30.0–36.0)
RDW: 13.5 % (ref 11.5–15.5)

## 2011-02-14 LAB — GLUCOSE, CAPILLARY: Glucose-Capillary: 110 mg/dL — ABNORMAL HIGH (ref 70–99)

## 2011-02-14 NOTE — Progress Notes (Signed)
Patient is eating, ambulating, voiding.  Pain control is good.  Filed Vitals:   02/13/11 1704 02/13/11 1845 02/13/11 2318 02/14/11 0648  BP: 105/68 119/78 119/79 112/79  Pulse: 72 88 68 60  Temp: 98.7 F (37.1 C) 98.2 F (36.8 C) 97.9 F (36.6 C)   TempSrc: Oral Axillary Oral   Resp: 16 16 18 18   Height:      Weight:      SpO2: 98% 98% 98%     Fundus firm Perineum without swelling.  Lab Results  Component Value Date   WBC 10.4 02/14/2011   HGB 10.1* 02/14/2011   HCT 28.7* 02/14/2011   MCV 82.7 02/14/2011   PLT PENDING 02/14/2011    O/Positive/-- (03/23 0000)  A/P Post partum day 1.  Routine care.  Expect d/c per plan.    Prospero Mahnke A
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
## 2011-02-14 NOTE — Addendum Note (Signed)
Addendum  created 02/14/11 1329 by Madison Hickman   Modules edited:Charges VN, Notes Section

## 2011-02-14 NOTE — Anesthesia Postprocedure Evaluation (Signed)
Anesthesia Post Note  Patient: Christy Jones  Procedure(s) Performed: * No procedures listed *  Anesthesia type: Epidural  Patient location: Mother/Baby  Post pain: Pain level controlled  Post assessment: Post-op Vital signs reviewed, Patient's Cardiovascular Status Stable, No headache, No backache and No residual numbness  Last Vitals:  Filed Vitals:   02/14/11 0648  BP: 112/79  Pulse: 60  Temp:   Resp: 18    Post vital signs: Reviewed and stable  Level of consciousness: awake, alert  and oriented  Complications: No apparent anesthesia complications

## 2011-02-14 NOTE — Progress Notes (Signed)
UR chart review completed.  

## 2011-02-15 NOTE — Discharge Summary (Signed)
  Discharge diagnoses  #1  39 week intrauterine pregnancy delivered 8 lbs. 6 oz. Female infant Apgars 9 and 9  #2 blood type O positive  #3  Gestational diabetes-diet controlled  #4  Induction of labor  Procedures  #1 normal spontaneous delivery  This 25 year old patient, a G5 now P2,  was admitted for induction by Dr. Arlyce Dice at [redacted] weeks gestation with a favorable cervix. The patient presented late for prenatal care and was found to be a gestational diabetic and this was diet controlled. She progressed after admission and had a normal spontaneous delivery of an 8 lbs. 6 oz. Female infant with Apgars of 9 and 9 over an intact perineum. Her postpartum course was totally benign.  She was bottle feeding at the time of discharge and was appropriately instructed. She will return to the office in approximately 4 weeks' time for followup. She was given in office discharge brochure and at the time of discharge and understood all instructions well.discharge medications include vitamins one a day until gone and she will use an iron pill 3-4 times a week as directed. Her discharge hemoglobin was 10.1 with a white count of 10,400 and a platelet count of 122,000. Condition on discharge improved

## 2011-03-13 LAB — URINALYSIS, ROUTINE W REFLEX MICROSCOPIC
Bilirubin Urine: NEGATIVE
Nitrite: NEGATIVE
Specific Gravity, Urine: 1.028
Urobilinogen, UA: 1

## 2011-03-13 LAB — POCT PREGNANCY, URINE
Operator id: 151321
Preg Test, Ur: NEGATIVE

## 2011-03-13 LAB — URINE MICROSCOPIC-ADD ON

## 2011-04-01 LAB — URINALYSIS, ROUTINE W REFLEX MICROSCOPIC
Glucose, UA: NEGATIVE
Hgb urine dipstick: NEGATIVE
Ketones, ur: NEGATIVE
Nitrite: NEGATIVE
Protein, ur: NEGATIVE
Specific Gravity, Urine: 1.034 — ABNORMAL HIGH
Urobilinogen, UA: 1
pH: 6

## 2011-04-01 LAB — GC/CHLAMYDIA PROBE AMP, GENITAL
Chlamydia, DNA Probe: NEGATIVE
GC Probe Amp, Genital: NEGATIVE

## 2011-04-01 LAB — DIFFERENTIAL
Basophils Absolute: 0.1
Basophils Relative: 1
Eosinophils Absolute: 0.1
Eosinophils Relative: 1
Lymphocytes Relative: 27
Lymphs Abs: 3.4
Monocytes Absolute: 0.6
Monocytes Relative: 5
Neutro Abs: 8.4 — ABNORMAL HIGH
Neutrophils Relative %: 67

## 2011-04-01 LAB — CBC
HCT: 45.2
Hemoglobin: 15.9 — ABNORMAL HIGH
MCHC: 35.2
MCV: 87.8
Platelets: 250
RBC: 5.15 — ABNORMAL HIGH
RDW: 12.6
WBC: 12.6 — ABNORMAL HIGH

## 2011-04-01 LAB — POCT PREGNANCY, URINE
Operator id: 294341
Preg Test, Ur: NEGATIVE

## 2011-04-01 LAB — WET PREP, GENITAL
Trich, Wet Prep: NONE SEEN
WBC, Wet Prep HPF POC: NONE SEEN
Yeast Wet Prep HPF POC: NONE SEEN

## 2011-04-01 LAB — RPR: RPR Ser Ql: NONREACTIVE

## 2011-04-04 LAB — GC/CHLAMYDIA PROBE AMP, GENITAL
Chlamydia, DNA Probe: NEGATIVE
GC Probe Amp, Genital: NEGATIVE

## 2011-04-04 LAB — WET PREP, GENITAL
Trich, Wet Prep: NONE SEEN
Yeast Wet Prep HPF POC: NONE SEEN

## 2011-04-04 LAB — URINALYSIS, ROUTINE W REFLEX MICROSCOPIC
Bilirubin Urine: NEGATIVE
Glucose, UA: NEGATIVE
Hgb urine dipstick: NEGATIVE
Ketones, ur: NEGATIVE
Nitrite: NEGATIVE
Protein, ur: NEGATIVE
Specific Gravity, Urine: 1.028
Urobilinogen, UA: 0.2
pH: 6

## 2011-04-04 LAB — CBC
HCT: 44.5
Hemoglobin: 15.5 — ABNORMAL HIGH
MCHC: 34.9
MCV: 88.4
Platelets: 258
RBC: 5.03
RDW: 13.3
WBC: 9

## 2011-04-04 LAB — URINE MICROSCOPIC-ADD ON

## 2011-04-04 LAB — PREGNANCY, URINE: Preg Test, Ur: NEGATIVE

## 2011-04-04 LAB — RPR: RPR Ser Ql: NONREACTIVE

## 2011-04-21 ENCOUNTER — Emergency Department (HOSPITAL_COMMUNITY)
Admission: EM | Admit: 2011-04-21 | Discharge: 2011-04-22 | Disposition: A | Discharge status: Home / Self Care | Payer: Medicaid Other | Attending: Emergency Medicine | Admitting: Emergency Medicine

## 2011-04-21 DIAGNOSIS — R109 Unspecified abdominal pain: Secondary | ICD-10-CM | POA: Insufficient documentation

## 2011-04-21 DIAGNOSIS — J45909 Unspecified asthma, uncomplicated: Secondary | ICD-10-CM | POA: Insufficient documentation

## 2011-04-21 DIAGNOSIS — R10814 Left lower quadrant abdominal tenderness: Secondary | ICD-10-CM | POA: Insufficient documentation

## 2011-04-22 ENCOUNTER — Emergency Department (HOSPITAL_COMMUNITY): Payer: Medicaid Other

## 2011-04-22 LAB — DIFFERENTIAL
Lymphocytes Relative: 37 % (ref 12–46)
Lymphs Abs: 4.1 10*3/uL — ABNORMAL HIGH (ref 0.7–4.0)
Monocytes Absolute: 0.8 10*3/uL (ref 0.1–1.0)
Monocytes Relative: 7 % (ref 3–12)
Neutro Abs: 6.1 10*3/uL (ref 1.7–7.7)
Neutrophils Relative %: 55 % (ref 43–77)

## 2011-04-22 LAB — POCT I-STAT, CHEM 8
BUN: 12 mg/dL (ref 6–23)
Chloride: 108 mEq/L (ref 96–112)
Creatinine, Ser: 1.1 mg/dL (ref 0.50–1.10)
Glucose, Bld: 117 mg/dL — ABNORMAL HIGH (ref 70–99)
Hemoglobin: 14.6 g/dL (ref 12.0–15.0)
Potassium: 4.1 mEq/L (ref 3.5–5.1)

## 2011-04-22 LAB — URINALYSIS, ROUTINE W REFLEX MICROSCOPIC
Bilirubin Urine: NEGATIVE
Glucose, UA: NEGATIVE mg/dL
Hgb urine dipstick: NEGATIVE
Specific Gravity, Urine: 1.031 — ABNORMAL HIGH (ref 1.005–1.030)
pH: 6 (ref 5.0–8.0)

## 2011-04-22 LAB — WET PREP, GENITAL: Yeast Wet Prep HPF POC: NONE SEEN

## 2011-04-22 LAB — CBC
HCT: 40.2 % (ref 36.0–46.0)
Hemoglobin: 14.6 g/dL (ref 12.0–15.0)
MCH: 29.5 pg (ref 26.0–34.0)
MCHC: 36.3 g/dL — ABNORMAL HIGH (ref 30.0–36.0)
RBC: 4.95 MIL/uL (ref 3.87–5.11)

## 2011-04-22 MED ORDER — IOHEXOL 300 MG/ML  SOLN
100.0000 mL | Freq: Once | INTRAMUSCULAR | Status: AC | PRN
  Administered 2011-04-22: 100 mL via INTRAVENOUS

## 2011-04-23 LAB — GC/CHLAMYDIA PROBE AMP, GENITAL
Chlamydia, DNA Probe: NEGATIVE
GC Probe Amp, Genital: NEGATIVE

## 2011-05-07 ENCOUNTER — Inpatient Hospital Stay (HOSPITAL_COMMUNITY): Payer: Self-pay

## 2011-05-07 ENCOUNTER — Inpatient Hospital Stay (HOSPITAL_COMMUNITY)
Admission: AD | Admit: 2011-05-07 | Discharge: 2011-05-07 | Disposition: A | Discharge status: Home / Self Care | Payer: Self-pay | Source: Ambulatory Visit | Attending: Family Medicine | Admitting: Family Medicine

## 2011-05-07 DIAGNOSIS — R1084 Generalized abdominal pain: Secondary | ICD-10-CM

## 2011-05-07 DIAGNOSIS — O99891 Other specified diseases and conditions complicating pregnancy: Secondary | ICD-10-CM | POA: Insufficient documentation

## 2011-05-07 DIAGNOSIS — R109 Unspecified abdominal pain: Secondary | ICD-10-CM | POA: Insufficient documentation

## 2011-05-07 LAB — CBC
MCH: 29.7 pg (ref 26.0–34.0)
MCHC: 36.9 g/dL — ABNORMAL HIGH (ref 30.0–36.0)
Platelets: 251 10*3/uL (ref 150–400)
RBC: 4.61 MIL/uL (ref 3.87–5.11)

## 2011-05-07 LAB — URINE MICROSCOPIC-ADD ON

## 2011-05-07 LAB — URINALYSIS, ROUTINE W REFLEX MICROSCOPIC
Ketones, ur: NEGATIVE mg/dL
Nitrite: NEGATIVE
pH: 6 (ref 5.0–8.0)

## 2011-05-07 LAB — HCG, QUANTITATIVE, PREGNANCY: hCG, Beta Chain, Quant, S: 115 m[IU]/mL — ABNORMAL HIGH (ref <5)

## 2011-05-07 LAB — WET PREP, GENITAL

## 2011-05-07 NOTE — ED Provider Notes (Signed)
History     Chief Complaint  Patient presents with  . Abdominal Pain   HPIKayvonna A Moore25 y.o. presents with abdominal pain in early pregnancy.  LMP 03/29/11.  G5 P2 0 2 2. Hx of ruptured ectopic 1 year ago, NSVD 8/12.  Condom failure.  Pain is lower around the pubic bone and spreads to both sides.  Shehad 2 Positive pregnancy tests at home.  Denies vaginal bleeding.  Has a little white discharge.        Past Medical History  Diagnosis Date  . Asthma   . Umbilical hernia   . Gestational diabetes     Past Surgical History  Procedure Date  . Exploratory laparotomy     removal of ectopic pregnancy    Family History  Problem Relation Age of Onset  . Asthma Maternal Grandmother   . Diabetes Maternal Grandmother     History  Substance Use Topics  . Smoking status: Current Some Day Smoker -- 0.2 packs/day  . Smokeless tobacco: Not on file  . Alcohol Use: No    Allergies: No Known Allergies  Prescriptions prior to admission  Medication Sig Dispense Refill  . acetaminophen (TYLENOL) 500 MG tablet Take 500 mg by mouth every 6 (six) hours as needed. For pain       . prenatal vitamin w/FE, FA (PRENATAL 1 + 1) 27-1 MG TABS Take 1 tablet by mouth daily.          Review of Systems  Constitutional: Negative.   Respiratory: Negative.   Cardiovascular: Negative.   Gastrointestinal: Positive for abdominal pain (lower/suprpubic). Negative for nausea and vomiting.  Genitourinary: Positive for frequency. Negative for dysuria, urgency and hematuria.       Negative for bleeding   + white discharge  Neurological: Positive for headaches (not usual for her to have headaches.  No change).   Physical Exam   Blood pressure 134/92, pulse 83, temperature 99.3 F (37.4 C), temperature source Oral, resp. rate 16, height 5\' 5"  (1.651 m), weight 178 lb 3.2 oz (80.831 kg), last menstrual period 03/29/2011.  Physical Exam  Constitutional: She is oriented to person, place, and time. She  appears well-developed and well-nourished. No distress.  HENT:  Head: Normocephalic.  Neck: Normal range of motion.  Cardiovascular: Normal rate.   Respiratory: Effort normal.  GI: Soft. She exhibits no mass. There is no tenderness. There is no rebound and no guarding.  Genitourinary: Uterus is not enlarged and not tender. Right adnexum displays no mass, no tenderness and no fullness. Left adnexum displays no mass, no tenderness and no fullness. No bleeding around the vagina. Vaginal discharge (white without odor) found.  Neurological: She is alert and oriented to person, place, and time.  Skin: Skin is warm and dry.   Results for orders placed during the hospital encounter of 05/07/11 (from the past 24 hour(s))  URINALYSIS, ROUTINE W REFLEX MICROSCOPIC     Status: Abnormal   Collection Time   05/07/11  7:45 PM      Component Value Range   Color, Urine YELLOW  YELLOW    Appearance CLEAR  CLEAR    Specific Gravity, Urine >1.030 (*) 1.005 - 1.030    pH 6.0  5.0 - 8.0    Glucose, UA NEGATIVE  NEGATIVE (mg/dL)   Hgb urine dipstick NEGATIVE  NEGATIVE    Bilirubin Urine NEGATIVE  NEGATIVE    Ketones, ur NEGATIVE  NEGATIVE (mg/dL)   Protein, ur NEGATIVE  NEGATIVE (mg/dL)  Urobilinogen, UA 0.2  0.0 - 1.0 (mg/dL)   Nitrite NEGATIVE  NEGATIVE    Leukocytes, UA TRACE (*) NEGATIVE   URINE MICROSCOPIC-ADD ON     Status: Normal   Collection Time   05/07/11  7:45 PM      Component Value Range   Squamous Epithelial / LPF RARE  RARE    WBC, UA 0-2  <3 (WBC/hpf)   Bacteria, UA RARE  RARE    Urine-Other MUCOUS PRESENT    POCT PREGNANCY, URINE     Status: Normal   Collection Time   05/07/11  7:51 PM      Component Value Range   Preg Test, Ur POSITIVE    CBC     Status: Abnormal   Collection Time   05/07/11  8:50 PM      Component Value Range   WBC 9.7  4.0 - 10.5 (K/uL)   RBC 4.61  3.87 - 5.11 (MIL/uL)   Hemoglobin 13.7  12.0 - 15.0 (g/dL)   HCT 16.1  09.6 - 04.5 (%)   MCV 80.5  78.0 -  100.0 (fL)   MCH 29.7  26.0 - 34.0 (pg)   MCHC 36.9 (*) 30.0 - 36.0 (g/dL)   RDW 40.9  81.1 - 91.4 (%)   Platelets 251  150 - 400 (K/uL)  HCG, QUANTITATIVE, PREGNANCY     Status: Abnormal   Collection Time   05/07/11  8:50 PM      Component Value Range   hCG, Beta Chain, Quant, S 115 (*) <5 (mIU/mL)  WET PREP, GENITAL     Status: Abnormal   Collection Time   05/07/11  9:05 PM      Component Value Range   Yeast, Wet Prep NONE SEEN  NONE SEEN    Trich, Wet Prep NONE SEEN  NONE SEEN    Clue Cells, Wet Prep FEW (*) NONE SEEN    WBC, Wet Prep HPF POC FEW (*) NONE SEEN    ULTRASOUND REPORT"  NO IUGS OR ADNEXAL MASSES SEEN  BLOOD TYPE PREVIOUS RECORD  O POSITIVE.  MAU Course  Procedures  GC/CHL cultures to lab  MDM 19:59  No rooms available in the unit.  I went out to the lobby  To inform patient of the labs and ultrasound that I have ordered.  She is stable and denies pain at this time.    In room for exam after ultrasound evaluation.  Assessment and Plan  A: Abdominal pain in early pregnancy  P:Return for repeat BHCG in 48hrs.  Return sooner if increased pain or heavy vaginal bleeding.    Bracken Moffa,EVE M 05/07/2011, 7:54 PM   Matt Holmes, NP 05/07/11 2205

## 2011-05-07 NOTE — Progress Notes (Signed)
Patient states she had a SVD on 8-22. Had a period in October. Has done 2 pregnancy tests at home that were positive. Has had lower abdominal pain that comes and goes. Has a history of an ruptured ectopic last year.

## 2011-05-07 NOTE — Progress Notes (Signed)
Pt relaxed wilth no signs of discomfort-to lobbey to wait for results of labwork done.

## 2011-05-08 NOTE — ED Provider Notes (Signed)
Chart reviewed and agree with management and plan.  

## 2011-05-10 ENCOUNTER — Encounter (HOSPITAL_COMMUNITY): Payer: Self-pay | Admitting: *Deleted

## 2011-05-10 ENCOUNTER — Inpatient Hospital Stay (HOSPITAL_COMMUNITY)
Admission: AD | Admit: 2011-05-10 | Discharge: 2011-05-10 | Disposition: A | Discharge status: Home / Self Care | Payer: Medicaid Other | Source: Ambulatory Visit | Attending: Obstetrics and Gynecology | Admitting: Obstetrics and Gynecology

## 2011-05-10 DIAGNOSIS — O99891 Other specified diseases and conditions complicating pregnancy: Secondary | ICD-10-CM | POA: Insufficient documentation

## 2011-05-10 DIAGNOSIS — R109 Unspecified abdominal pain: Secondary | ICD-10-CM | POA: Insufficient documentation

## 2011-05-10 DIAGNOSIS — O26899 Other specified pregnancy related conditions, unspecified trimester: Secondary | ICD-10-CM

## 2011-05-10 HISTORY — DX: Headache: R51

## 2011-05-10 HISTORY — DX: Chlamydial infection, unspecified: A74.9

## 2011-05-10 HISTORY — DX: Gestational (pregnancy-induced) hypertension without significant proteinuria, unspecified trimester: O13.9

## 2011-05-10 HISTORY — DX: Urinary tract infection, site not specified: N39.0

## 2011-05-10 HISTORY — DX: Sickle-cell trait: D57.3

## 2011-05-10 HISTORY — DX: Trichomoniasis, unspecified: A59.9

## 2011-05-10 LAB — HCG, QUANTITATIVE, PREGNANCY: hCG, Beta Chain, Quant, S: 365 m[IU]/mL — ABNORMAL HIGH (ref <5)

## 2011-05-10 NOTE — ED Provider Notes (Signed)
History     Chief Complaint  Patient presents with  . Follow-up   HPI Christy Jones is 25 y.o. [id number] Unknown weeks presenting for followup BHCG.  She was seen 11/13 with abdominal pain in early pregnancy.  BHCG was 115 and U/S did not see IUGS or adnexal mass.  She denies pain and bleeding today.    Past Medical History  Diagnosis Date  . Asthma   . Umbilical hernia   . Gestational diabetes   . Pregnancy induced hypertension     post- partem 2012  . Sickle cell trait   . Urinary tract infection   . Headache   . Chlamydia   . Trichomonas     Past Surgical History  Procedure Date  . Exploratory laparotomy     removal of ectopic pregnancy    Family History  Problem Relation Age of Onset  . Asthma Maternal Grandmother   . Diabetes Maternal Grandmother   . Anesthesia problems Neg Hx     History  Substance Use Topics  . Smoking status: Current Some Day Smoker -- 0.2 packs/day for 10 years  . Smokeless tobacco: Never Used  . Alcohol Use: No    Allergies:  Allergies  Allergen Reactions  . Lactose Intolerance (Gi) Nausea And Vomiting    Prescriptions prior to admission  Medication Sig Dispense Refill  . acetaminophen (TYLENOL) 500 MG tablet Take 1,000 mg by mouth every 6 (six) hours as needed. For pain      . albuterol (PROVENTIL HFA;VENTOLIN HFA) 108 (90 BASE) MCG/ACT inhaler Inhale 2 puffs into the lungs as needed. Pt only uses if she needs.         Review of Systems  Gastrointestinal: Negative for abdominal pain.  Genitourinary:       Negative for vaginal bleeding   Physical Exam   Blood pressure 116/76, pulse 82, temperature 98.2 F (36.8 C), temperature source Oral, resp. rate 18, height 5\' 5"  (1.651 m), weight 175 lb (79.379 kg), last menstrual period 03/29/2011, unknown if currently breastfeeding.  Physical Exam  Not indicated.    Results for orders placed during the hospital encounter of 05/10/11 (from the past 24 hour(s))  HCG,  QUANTITATIVE, PREGNANCY     Status: Abnormal   Collection Time   05/10/11  8:50 AM      Component Value Range   hCG, Beta Chain, Quant, S 365 (*) <5 (mIU/mL)   MAU Course  Procedures  MDM Discussed with Dr. Jolayne Panther.  Will repeat ultrasound in 7-10 days.  Give ectopic precautions.  Assessment and Plan  A:  Abdominal pain in early pregnancy.  BHCG doubled  P:  Return for ultrasound in 7-10 days      Return for pain or vaginal bleeding.  [name],EVE M 05/10/2011, 8:50 AM   Matt Holmes, NP 05/10/11 [phone]

## 2011-05-10 NOTE — Discharge Instructions (Signed)
Abdominal Pain During Pregnancy Belly (abdominal) pain is common during pregnancy. Most of the time, it is not a serious problem. Other times, it can be a sign that something is wrong with the pregnancy. Always tell your doctor if you have belly pain. HOME CARE For mild pain:  Do not have sex (intercourse) or put anything in your vagina until you feel better.   Rest until your pain stops. If your pain lasts longer than 1 hour, call your doctor.   Drink clear fluids if you feel sick to your stomach (nauseous).   Do not eat solid food until you feel better.   Only take medicine as told by your doctor.   Keep all doctor visits as told.  GET HELP RIGHT AWAY IF:   You are bleeding, leaking fluid, or pieces of tissue come out of your vagina.   You have more pain or cramping.   You keep throwing up (vomiting).   You have pain when you pee (urinate) or have blood in your pee.   You have a fever.   You do not feel your baby moving as much.   You feel very weak or feel like passing out.   You have trouble breathing, with or without belly pain.   You have a very bad headache and belly pain.   You have fluid leaking from your vagina and belly pain.   You keep having watery poop (diarrhea).   Your belly pain does not go away after resting, or the pain gets worse.  MAKE SURE YOU:   Understand these instructions.   Will watch your condition.   Will get help right away if you are not doing well or get worse.  Document Released: 05/29/2009 Document Revised: 02/20/2011 Document Reviewed: 01/04/2011 [facility] Patient Information 2012 [place], [place].   IT IS VERY IMPORTANT TO RETURN BEFORE YOUR ULTRASOUND DATE IF YOU HAVE SEVERE ABDOMINAL PAIN OR VAGINAL BLEEDING

## 2011-05-10 NOTE — Progress Notes (Signed)
Here 11/13 p.m.  Thought preg, was having pain.  Here today for repeat blood work.  No pain or bleeding at this time.

## 2011-05-17 ENCOUNTER — Inpatient Hospital Stay (HOSPITAL_COMMUNITY)
Admission: AD | Admit: 2011-05-17 | Discharge: 2011-05-17 | Disposition: A | Discharge status: Home / Self Care | Payer: Medicaid Other | Source: Ambulatory Visit | Attending: Obstetrics & Gynecology | Admitting: Obstetrics & Gynecology

## 2011-05-17 ENCOUNTER — Encounter (HOSPITAL_COMMUNITY): Payer: Self-pay

## 2011-05-17 ENCOUNTER — Ambulatory Visit (HOSPITAL_COMMUNITY)
Admit: 2011-05-17 | Discharge: 2011-05-17 | Disposition: A | Discharge status: Home / Self Care | Payer: Medicaid Other | Attending: Gynecology | Admitting: Gynecology

## 2011-05-17 DIAGNOSIS — O365990 Maternal care for other known or suspected poor fetal growth, unspecified trimester, not applicable or unspecified: Secondary | ICD-10-CM | POA: Insufficient documentation

## 2011-05-17 DIAGNOSIS — Z3490 Encounter for supervision of normal pregnancy, unspecified, unspecified trimester: Secondary | ICD-10-CM

## 2011-05-17 DIAGNOSIS — O99891 Other specified diseases and conditions complicating pregnancy: Secondary | ICD-10-CM | POA: Insufficient documentation

## 2011-05-17 DIAGNOSIS — Z3689 Encounter for other specified antenatal screening: Secondary | ICD-10-CM | POA: Insufficient documentation

## 2011-05-17 DIAGNOSIS — Z1389 Encounter for screening for other disorder: Secondary | ICD-10-CM

## 2011-05-17 NOTE — ED Provider Notes (Signed)
History   Pt presents today for [place] to confirm IUP. She states she is doing well and has no complaints. She denies abd pain, bleeding, or any other sx at this time.   Chief Complaint  Patient presents with  . Follow-up   HPI  OB History    Grav Para Term Preterm Abortions TAB SAB Ect Mult Living   5 2 2  0 2 0 1 1 0 2      Past Medical History  Diagnosis Date  . Asthma   . Umbilical hernia   . Gestational diabetes   . Pregnancy induced hypertension     post- partem 2012  . Sickle cell trait   . Urinary tract infection   . Headache   . Chlamydia   . Trichomonas     Past Surgical History  Procedure Date  . Exploratory laparotomy     removal of ectopic pregnancy    Family History  Problem Relation Age of Onset  . Asthma Maternal Grandmother   . Diabetes Maternal Grandmother   . Anesthesia problems Neg Hx     History  Substance Use Topics  . Smoking status: Current Some Day Smoker -- 0.2 packs/day for 10 years  . Smokeless tobacco: Never Used  . Alcohol Use: No    Allergies:  Allergies  Allergen Reactions  . Lactose Intolerance (Gi) Nausea And Vomiting    Prescriptions prior to admission  Medication Sig Dispense Refill  . acetaminophen (TYLENOL) 500 MG tablet Take 1,000 mg by mouth every 6 (six) hours as needed. For pain      . albuterol (PROVENTIL HFA;VENTOLIN HFA) 108 (90 BASE) MCG/ACT inhaler Inhale 2 puffs into the lungs as needed. Pt only uses if she needs.         Review of Systems  Constitutional: Negative for fever.  Cardiovascular: Negative for chest pain.  Gastrointestinal: Negative for nausea, vomiting, abdominal pain, diarrhea and constipation.  Genitourinary: Negative for dysuria, urgency, frequency and hematuria.  Neurological: Negative for dizziness and headaches.  Psychiatric/Behavioral: Negative for depression and suicidal ideas.   Physical Exam   Blood pressure 127/77, pulse 69, resp. rate 16, last menstrual period 03/29/2011, SpO2  96.00%, unknown if currently breastfeeding.  Physical Exam  Nursing note and vitals reviewed. Constitutional: She is oriented to person, place, and time. She appears well-developed and well-nourished. No distress.  HENT:  Head: Normocephalic and atraumatic.  Eyes: EOM are normal. Pupils are equal, round, and reactive to light.  GI: Soft. She exhibits no distension. There is no tenderness. There is no rebound and no guarding.  Neurological: She is alert and oriented to person, place, and time.  Skin: Skin is warm and dry. She is not diaphoretic.  Psychiatric: She has a normal mood and affect. Her behavior is normal. Judgment and thought content normal.    MAU Course  Procedures  US shows IUP with yolk sac. Consistent with 5.4wks. No fetal pole seen at this time.  Assessment and Plan  IUP: discussed with pt at length. She will f/u with her OB provider. Discussed diet, activity, risks, and precautions.  Clinton Gallant. Angalena Cousineau III, DrHSc, MPAS, PA-C  05/17/2011, 1:39 PM   Henrietta Hoover, PA 05/17/11 1342

## 2011-05-17 NOTE — Progress Notes (Signed)
Patient states she is in MAU for a follow up ultrasound. Patient denies any pain or bleeding.

## 2011-06-03 NOTE — Telephone Encounter (Signed)
Preadmission screen  

## 2011-09-16 ENCOUNTER — Encounter (HOSPITAL_COMMUNITY): Payer: Self-pay | Admitting: Emergency Medicine

## 2011-09-16 ENCOUNTER — Emergency Department (HOSPITAL_COMMUNITY)
Admission: EM | Admit: 2011-09-16 | Discharge: 2011-09-17 | Disposition: A | Discharge status: Home / Self Care | Payer: Self-pay | Attending: Emergency Medicine | Admitting: Emergency Medicine

## 2011-09-16 DIAGNOSIS — J069 Acute upper respiratory infection, unspecified: Secondary | ICD-10-CM | POA: Insufficient documentation

## 2011-09-16 DIAGNOSIS — J45909 Unspecified asthma, uncomplicated: Secondary | ICD-10-CM | POA: Insufficient documentation

## 2011-09-16 NOTE — ED Notes (Signed)
PT. REPORTS DRY COUGH WITH NASAL CONGESTION , HEADACHE AND CHEST CONGESTION FOR SEVERAL DAYS , ALSO REQUESTING PREGNANCY TEST LMP= FEB.12 ,2013.

## 2011-09-17 ENCOUNTER — Encounter (HOSPITAL_COMMUNITY): Payer: Self-pay | Admitting: Radiology

## 2011-09-17 ENCOUNTER — Emergency Department (HOSPITAL_COMMUNITY): Payer: Self-pay

## 2011-09-17 MED ORDER — HYDROCOD POLST-CHLORPHEN POLST 10-8 MG/5ML PO LQCR: 5.0000 mL | Freq: Two times a day (BID) | ORAL | Status: DC | PRN

## 2011-09-17 NOTE — ED Provider Notes (Signed)
History     CSN: [id number]  Arrival date & time 09/16/11  2229   First MD Initiated Contact with Patient 09/17/11 0113      Chief Complaint  Patient presents with  . Cough     HPI  History provided by the patient. Patient is a 26 year old female with who presents with complaints of dry cough, nasal congestion and headache for the past 4 days. Symptoms began gradually and have been persistent. Patient has been using DayQuil and NyQuil for symptoms with mild relief. She denies any other aggravating or alleviating factors. Patient denies any associated fever, chills, sweats no shortness of breath. Patient does report being around other sick coworkers.    Past Medical History  Diagnosis Date  . Asthma   . Umbilical hernia   . Gestational diabetes   . Pregnancy induced hypertension     post- partem 2012  . Sickle cell trait   . Urinary tract infection   . Headache   . Chlamydia   . Trichomonas     Past Surgical History  Procedure Date  . Exploratory laparotomy     removal of ectopic pregnancy  . Ectopic pregnancy surgery     Family History  Problem Relation Age of Onset  . Asthma Maternal Grandmother   . Diabetes Maternal Grandmother   . Anesthesia problems Neg Hx     History  Substance Use Topics  . Smoking status: Current Some Day Smoker -- 0.2 packs/day for 10 years  . Smokeless tobacco: Never Used  . Alcohol Use: No    OB History    Grav Para Term Preterm Abortions TAB SAB Ect Mult Living   5 2 2  0 2 0 1 1 0 2      Review of Systems  Constitutional: Positive for fatigue. Negative for fever, chills and appetite change.  HENT: Positive for congestion, sore throat and rhinorrhea.   Respiratory: Positive for cough. Negative for shortness of breath.   Cardiovascular: Negative for chest pain.  Gastrointestinal: Negative for vomiting, abdominal pain and diarrhea.  Musculoskeletal: Negative for myalgias.  Neurological: Positive for headaches.     Allergies  Lactose intolerance (gi)  Home Medications   Current Outpatient Rx  Name Route Sig Dispense Refill  . ACETAMINOPHEN 500 MG PO TABS Oral Take 1,000 mg by mouth every 6 (six) hours as needed. For pain    . ALBUTEROL SULFATE HFA 108 (90 BASE) MCG/ACT IN AERS Inhalation Inhale 2 puffs into the lungs as needed. For shortness of breath    . NYQUIL PO Oral Take 30 mLs by mouth at bedtime as needed. For cough/sleep    . DAYQUIL PO Oral Take 30 mLs by mouth 2 (two) times daily as needed. For cough      BP 113/79  Pulse 81  Temp(Src) 98.3 F (36.8 C) (Oral)  Resp 19  SpO2 96%  LMP 09/12/2011  Breastfeeding? No  Physical Exam  Nursing note and vitals reviewed. Constitutional: She is oriented to person, place, and time. She appears well-developed and well-nourished. No distress.  HENT:  Head: Normocephalic.  Mouth/Throat: Oropharynx is clear and moist.  Neck: Normal range of motion.  Cardiovascular: Normal rate and regular rhythm.   Pulmonary/Chest: Effort normal and breath sounds normal. No respiratory distress. She has no wheezes. She has no rales.  Abdominal: Soft. She exhibits no distension.  Lymphadenopathy:    She has no cervical adenopathy.  Neurological: She is alert and oriented to person, place, and time.  Skin: Skin is warm and dry. No rash noted.  Psychiatric: She has a normal mood and affect. Her behavior is normal.    ED Course  Procedures   Results for orders placed during the hospital encounter of 09/16/11  POCT PREGNANCY, URINE      Component Value Range   Preg Test, Ur NEGATIVE  NEGATIVE        Dg Chest 2 View  09/17/2011  *RADIOLOGY REPORT*  Clinical Data: Cough and congestion.  CHEST - 2 VIEW  Comparison: 09/27/2008  Findings: The cardiomediastinal silhouette is unremarkable. Mild peribronchial thickening is stable. There is no evidence of focal airspace disease, pulmonary edema, suspicious pulmonary nodule/mass, pleural effusion, or  pneumothorax. No acute bony abnormalities are identified.  IMPRESSION: No evidence of acute cardiopulmonary disease.  Original Report Authenticated By: Rosendo Gros, M.D.     1. URI (upper respiratory infection)       MDM  2:20 AM patient seen and evaluated. Patient in no acute distress.        Angus Seller, [place] 09/17/11 [phone]

## 2011-09-17 NOTE — ED Provider Notes (Signed)
Medical screening examination/treatment/procedure(s) were performed by non-physician practitioner and as supervising physician I was immediately available for consultation/collaboration.   Leigh-Ann Deandra Gadson, MD 09/17/11 2350 

## 2011-09-17 NOTE — Discharge Instructions (Signed)
Your chest x-ray today do not show any concerning signs for pneumonia infection. At this time your providers feel your symptoms are caused from a viral upper respiratory infection. Continue to drink plenty of fluids to stay hydrated and get plenty of rest to help your body fight infection.  Upper Respiratory Infection, Adult An upper respiratory infection (URI) is also sometimes known as the common cold. The upper respiratory tract includes the nose, sinuses, throat, trachea, and bronchi. Bronchi are the airways leading to the lungs. Most people improve within 1 week, but symptoms can last up to 2 weeks. A residual cough may last even longer.  CAUSES Many different viruses can infect the tissues lining the upper respiratory tract. The tissues become irritated and inflamed and often become very moist. Mucus production is also common. A cold is contagious. You can easily spread the virus to others by oral contact. This includes kissing, sharing a glass, coughing, or sneezing. Touching your mouth or nose and then touching a surface, which is then touched by another person, can also spread the virus. SYMPTOMS  Symptoms typically develop 1 to 3 days after you come in contact with a cold virus. Symptoms vary from person to person. They may include:  Runny nose.   Sneezing.   Nasal congestion.   Sinus irritation.   Sore throat.   Loss of voice (laryngitis).   Cough.   Fatigue.   Muscle aches.   Loss of appetite.   Headache.   Low-grade fever.  DIAGNOSIS  You might diagnose your own cold based on familiar symptoms, since most people get a cold 2 to 3 times a year. Your caregiver can confirm this based on your exam. Most importantly, your caregiver can check that your symptoms are not due to another disease such as strep throat, sinusitis, pneumonia, asthma, or epiglottitis. Blood tests, throat tests, and X-rays are not necessary to diagnose a common cold, but they may sometimes be helpful in  excluding other more serious diseases. Your caregiver will decide if any further tests are required. RISKS AND COMPLICATIONS  You may be at risk for a more severe case of the common cold if you smoke cigarettes, have chronic heart disease (such as heart failure) or lung disease (such as asthma), or if you have a weakened immune system. The very young and very old are also at risk for more serious infections. Bacterial sinusitis, middle ear infections, and bacterial pneumonia can complicate the common cold. The common cold can worsen asthma and chronic obstructive pulmonary disease (COPD). Sometimes, these complications can require emergency medical care and may be life-threatening. PREVENTION  The best way to protect against getting a cold is to practice good hygiene. Avoid oral or hand contact with people with cold symptoms. Wash your hands often if contact occurs. There is no clear evidence that vitamin C, vitamin E, echinacea, or exercise reduces the chance of developing a cold. However, it is always recommended to get plenty of rest and practice good nutrition. TREATMENT  Treatment is directed at relieving symptoms. There is no cure. Antibiotics are not effective, because the infection is caused by a virus, not by bacteria. Treatment may include:  Increased fluid intake. Sports drinks offer valuable electrolytes, sugars, and fluids.   Breathing heated mist or steam (vaporizer or shower).   Eating chicken soup or other clear broths, and maintaining good nutrition.   Getting plenty of rest.   Using gargles or lozenges for comfort.   Controlling fevers with ibuprofen or  acetaminophen as directed by your caregiver.   Increasing usage of your inhaler if you have asthma.  Zinc gel and zinc lozenges, taken in the first 24 hours of the common cold, can shorten the duration and lessen the severity of symptoms. Pain medicines may help with fever, muscle aches, and throat pain. A variety of  non-prescription medicines are available to treat congestion and runny nose. Your caregiver can make recommendations and may suggest nasal or lung inhalers for other symptoms.  HOME CARE INSTRUCTIONS   Only take over-the-counter or prescription medicines for pain, discomfort, or fever as directed by your caregiver.   Use a warm mist humidifier or inhale steam from a shower to increase air moisture. This may keep secretions moist and make it easier to breathe.   Drink enough water and fluids to keep your urine clear or pale yellow.   Rest as needed.   Return to work when your temperature has returned to normal or as your caregiver advises. You may need to stay home longer to avoid infecting others. You can also use a face mask and careful hand washing to prevent spread of the virus.  SEEK MEDICAL CARE IF:   After the first few days, you feel you are getting worse rather than better.   You need your caregiver's advice about medicines to control symptoms.   You develop chills, worsening shortness of breath, or brown or red sputum. These may be signs of pneumonia.   You develop yellow or brown nasal discharge or pain in the face, especially when you bend forward. These may be signs of sinusitis.   You develop a fever, swollen neck glands, pain with swallowing, or white areas in the back of your throat. These may be signs of strep throat.  SEEK IMMEDIATE MEDICAL CARE IF:   You have a fever.   You develop severe or persistent headache, ear pain, sinus pain, or chest pain.   You develop wheezing, a prolonged cough, cough up blood, or have a change in your usual mucus (if you have chronic lung disease).   You develop sore muscles or a stiff neck.  Document Released: 12/04/2000 Document Revised: 05/30/2011 Document Reviewed: 10/12/2010 [facility] Patient Information 2012 [place], [place].   RESOURCE GUIDE  Dental Problems  Patients with Medicaid: [facility] [phone]                                           [phone] [address] Phone:  [phone]                                                  Phone:  [phone]  If unable to pay or uninsured, contact:  [facility] or [facility]. to become qualified for the adult dental clinic.  Chronic Pain Problems Contact [facility]  [phone] Patients need to be referred by their primary care doctor.  Insufficient Money for Medicine Contact [employer] Way:  call "211" or [facility] [phone].  No Primary Care Doctor Call [facility]  [phone] Other agencies that provide  inexpensive medical care    [facility]  [phone]    [facility]  [phone]    [facility]  [phone]    [facility]  [phone]    [facility]  [phone]    [facility]  [phone]  Psychological Services [facility]  [phone] [facility]  [phone] [facility] Mental [facility]   [phone] (emergency services [phone])  Substance Abuse Resources [facility]  [phone] [facility] [phone] [facility] [place] [phone] Floydene Flock [phone] Residential & Outpatient Substance Abuse Program  [phone]  Abuse/Neglect [facility] Child Abuse Hotline 909-429-6928 [facility] Child Abuse Hotline 620-569-5811 (After Hours)  Emergency Shelter [facility] 6460822442  Maternity Homes Room at the [place] [facility] [place] [phone] [facility] [phone]  MRSA Hotline #:   [phone]    [facility]  [facility]     [employer]                          [facility] Dept. [address] [place]                       [address]      [address]  Blondell Reveal Phone:  [phone]                                   Phone:  [phone]                 Phone:  [phone]  [facility] Mental [facility] Phone:  [phone]  [facility] Child Abuse Hotline [phone] (After Hours)

## 2012-05-22 ENCOUNTER — Emergency Department (HOSPITAL_COMMUNITY): Payer: Self-pay

## 2012-05-22 ENCOUNTER — Emergency Department (HOSPITAL_COMMUNITY)
Admission: EM | Admit: 2012-05-22 | Discharge: 2012-05-22 | Disposition: A | Discharge status: Home / Self Care | Payer: Self-pay | Attending: Emergency Medicine | Admitting: Emergency Medicine

## 2012-05-22 ENCOUNTER — Encounter (HOSPITAL_COMMUNITY): Payer: Self-pay

## 2012-05-22 DIAGNOSIS — Z8619 Personal history of other infectious and parasitic diseases: Secondary | ICD-10-CM | POA: Insufficient documentation

## 2012-05-22 DIAGNOSIS — F17200 Nicotine dependence, unspecified, uncomplicated: Secondary | ICD-10-CM | POA: Insufficient documentation

## 2012-05-22 DIAGNOSIS — Z87448 Personal history of other diseases of urinary system: Secondary | ICD-10-CM | POA: Insufficient documentation

## 2012-05-22 DIAGNOSIS — M545 Low back pain, unspecified: Secondary | ICD-10-CM | POA: Insufficient documentation

## 2012-05-22 DIAGNOSIS — Z8719 Personal history of other diseases of the digestive system: Secondary | ICD-10-CM | POA: Insufficient documentation

## 2012-05-22 DIAGNOSIS — J45909 Unspecified asthma, uncomplicated: Secondary | ICD-10-CM | POA: Insufficient documentation

## 2012-05-22 DIAGNOSIS — Z8632 Personal history of gestational diabetes: Secondary | ICD-10-CM | POA: Insufficient documentation

## 2012-05-22 DIAGNOSIS — Z862 Personal history of diseases of the blood and blood-forming organs and certain disorders involving the immune mechanism: Secondary | ICD-10-CM | POA: Insufficient documentation

## 2012-05-22 DIAGNOSIS — Z79899 Other long term (current) drug therapy: Secondary | ICD-10-CM | POA: Insufficient documentation

## 2012-05-22 DIAGNOSIS — K8020 Calculus of gallbladder without cholecystitis without obstruction: Secondary | ICD-10-CM | POA: Insufficient documentation

## 2012-05-22 DIAGNOSIS — R1011 Right upper quadrant pain: Secondary | ICD-10-CM | POA: Insufficient documentation

## 2012-05-22 LAB — COMPREHENSIVE METABOLIC PANEL WITH GFR
ALT: 9 U/L (ref 0–35)
AST: 14 U/L (ref 0–37)
Albumin: 3.7 g/dL (ref 3.5–5.2)
Alkaline Phosphatase: 76 U/L (ref 39–117)
BUN: 10 mg/dL (ref 6–23)
CO2: 24 meq/L (ref 19–32)
Calcium: 8.9 mg/dL (ref 8.4–10.5)
Chloride: 104 meq/L (ref 96–112)
Creatinine, Ser: 0.57 mg/dL (ref 0.50–1.10)
GFR calc Af Amer: >90 mL/min (ref >90)
GFR calc non Af Amer: >90 mL/min (ref >90)
Glucose, Bld: 103 mg/dL — ABNORMAL HIGH (ref 70–99)
Potassium: 3.9 meq/L (ref 3.5–5.1)
Sodium: 136 meq/L (ref 135–145)
Total Bilirubin: 0.3 mg/dL (ref 0.3–1.2)
Total Protein: 6.7 g/dL (ref 6.0–8.3)

## 2012-05-22 LAB — CBC WITH DIFFERENTIAL/PLATELET
Basophils Absolute: 0 K/uL (ref 0.0–0.1)
Basophils Relative: 0 % (ref 0–1)
Eosinophils Absolute: 0.1 K/uL (ref 0.0–0.7)
Eosinophils Relative: 1 % (ref 0–5)
HCT: 39.8 % (ref 36.0–46.0)
Hemoglobin: 14.7 g/dL (ref 12.0–15.0)
Lymphocytes Relative: 29 % (ref 12–46)
Lymphs Abs: 2.9 K/uL (ref 0.7–4.0)
MCH: 30.5 pg (ref 26.0–34.0)
MCHC: 36.9 g/dL — ABNORMAL HIGH (ref 30.0–36.0)
MCV: 82.6 fL (ref 78.0–100.0)
Monocytes Absolute: 0.9 K/uL (ref 0.1–1.0)
Monocytes Relative: 9 % (ref 3–12)
Neutro Abs: 6.1 K/uL (ref 1.7–7.7)
Neutrophils Relative %: 60 % (ref 43–77)
Platelets: 192 K/uL (ref 150–400)
RBC: 4.82 MIL/uL (ref 3.87–5.11)
RDW: 13.1 % (ref 11.5–15.5)
WBC: 10 K/uL (ref 4.0–10.5)

## 2012-05-22 LAB — URINALYSIS, ROUTINE W REFLEX MICROSCOPIC
Glucose, UA: NEGATIVE mg/dL
Hgb urine dipstick: NEGATIVE
Ketones, ur: 15 mg/dL — AB
Leukocytes, UA: NEGATIVE
Nitrite: NEGATIVE
Protein, ur: NEGATIVE mg/dL
Specific Gravity, Urine: 1.038 — ABNORMAL HIGH (ref 1.005–1.030)
Urobilinogen, UA: 1 mg/dL (ref 0.0–1.0)
pH: 6 (ref 5.0–8.0)

## 2012-05-22 LAB — LIPASE, BLOOD: Lipase: 31 U/L (ref 11–59)

## 2012-05-22 LAB — PREGNANCY, URINE: Preg Test, Ur: NEGATIVE

## 2012-05-22 MED ORDER — CYCLOBENZAPRINE HCL 10 MG PO TABS: 10.0000 mg | ORAL_TABLET | Freq: Three times a day (TID) | ORAL | Status: DC | PRN

## 2012-05-22 MED ORDER — HYDROCODONE-ACETAMINOPHEN 5-325 MG PO TABS: 1.0000 | ORAL_TABLET | Freq: Four times a day (QID) | ORAL | Status: DC | PRN

## 2012-05-22 MED ORDER — KETOROLAC TROMETHAMINE 30 MG/ML IJ SOLN
30.0000 mg | Freq: Once | INTRAMUSCULAR | Status: AC
  Administered 2012-05-22: 30 mg via INTRAVENOUS
  Filled 2012-05-22: qty 1

## 2012-05-22 NOTE — ED Provider Notes (Signed)
History     CSN: [id number]  Arrival date & time 05/22/12  0800   First MD Initiated Contact with Patient 05/22/12 [phone]      Chief Complaint  Patient presents with  . Back Pain    (Consider location/radiation/quality/duration/timing/severity/associated sxs/prior treatment) HPI The patient presents with lower back pan and R upper abdominal pain. The patient states that her back began to hurt about a week ago. The patient states that she has had no nausea, vomiting, headache, chest pain, shortness of breath, weakness, diarrhea, dysuria, syncope, vaginal bleeding, vaginal discharge, or fever. The patient states that she has not taken anything for any her discomfort. The patient states that movement and palpation make the pain worse.  Past Medical History  Diagnosis Date  . Asthma   . Umbilical hernia   . Gestational diabetes   . Pregnancy induced hypertension     post- partem 2012  . Sickle cell trait   . Urinary tract infection   . Headache   . Chlamydia   . Trichomonas     Past Surgical History  Procedure Date  . Exploratory laparotomy     removal of ectopic pregnancy  . Ectopic pregnancy surgery     Family History  Problem Relation Age of Onset  . Asthma Maternal Grandmother   . Diabetes Maternal Grandmother   . Anesthesia problems Neg Hx     History  Substance Use Topics  . Smoking status: Current Some Day Smoker -- 0.2 packs/day for 10 years  . Smokeless tobacco: Never Used  . Alcohol Use: No    OB History    Grav Para Term Preterm Abortions TAB SAB Ect Mult Living   5 2 2  0 2 0 1 1 0 2      Review of Systems All other systems negative except as documented in the HPI. All pertinent positives and negatives as reviewed in the HPI.  Allergies  Lactose intolerance (gi)  Home Medications   Current Outpatient Rx  Name  Route  Sig  Dispense  Refill  . ALBUTEROL SULFATE HFA 108 (90 BASE) MCG/ACT IN AERS   Inhalation   Inhale 2 puffs into the lungs as  needed. For shortness of breath           BP 99/74  Pulse 63  Temp 97 F (36.1 C) (Oral)  Resp 14  SpO2 98%  LMP 05/06/2012  Physical Exam  Nursing note and vitals reviewed. Constitutional: She is oriented to person, place, and time. She appears well-developed and well-nourished.  HENT:  Head: Normocephalic and atraumatic.  Mouth/Throat: Oropharynx is clear and moist.  Eyes: Pupils are equal, round, and reactive to light.  Neck: Normal range of motion. Neck supple.  Cardiovascular: Normal rate, regular rhythm and normal heart sounds.  Exam reveals no gallop and no friction rub.   No murmur heard. Pulmonary/Chest: Effort normal and breath sounds normal. No respiratory distress.  Abdominal: Soft. Normal appearance and bowel sounds are normal. There is tenderness in the right upper quadrant. There is no rigidity, no rebound, no guarding, no tenderness at McBurney's point and negative Murphy's sign.    Musculoskeletal:       Lumbar back: She exhibits tenderness and pain. She exhibits no bony tenderness, no deformity and no spasm.  Neurological: She is alert and oriented to person, place, and time.  Skin: Skin is warm and dry. No rash noted.    ED Course  Procedures (including critical care time)  Labs Reviewed  URINALYSIS, ROUTINE W REFLEX MICROSCOPIC - Abnormal; Notable for the following:    Color, Urine AMBER (*)  BIOCHEMICALS MAY BE AFFECTED BY COLOR   APPearance HAZY (*)     Specific Gravity, Urine 1.038 (*)     Bilirubin Urine SMALL (*)     Ketones, ur 15 (*)  15   All other components within normal limits  COMPREHENSIVE METABOLIC PANEL - Abnormal; Notable for the following:    Glucose, Bld 103 (*)     All other components within normal limits  CBC WITH DIFFERENTIAL - Abnormal; Notable for the following:    MCHC 36.9 (*)     All other components within normal limits  PREGNANCY, URINE  LIPASE, BLOOD   US Abdomen Complete  05/22/2012  *RADIOLOGY REPORT*   Clinical Data:  Abdominal pain  COMPLETE ABDOMINAL ULTRASOUND  Comparison:  CT 04/22/2011  Findings:  Gallbladder:  There are 3 or 4  large gallstones within the gallbladder measuring 8 mm each.  No gallbladder wall thickening or pericholecystic fluid.  Negative sonographic Murphy's sign.  Common bile duct:  Normal diameter 3 mm.  Liver:  No focal lesion identified.  Within normal limits in parenchymal echogenicity.  IVC:  Appears normal.  Pancreas:  No focal abnormality seen.  Spleen:  Normal size and echogenicity.  Right Kidney:  10.5cm in length.  No evidence of hydronephrosis or stones.  Left Kidney:  10.3cm in length.  No evidence of hydronephrosis or stones.  Abdominal aorta:  No aneurysm identified.  IMPRESSION:  1.  Cholelithiasis without evidence cholecystitis. 2.  No biliary duct dilatation.   Original Report Authenticated By: Genevive Bi, M.D.     The patient does have gallstones. The patient will be referred for these. The patient is stable. Advised that she needs to return here for any worsening in her condition.   MDM         Carlyle Dolly, PA-C 05/23/12 [phone]

## 2012-05-22 NOTE — ED Notes (Signed)
Pt with c/io back pain for more than a week, epigastric crampiness and numb legs

## 2012-05-23 NOTE — ED Provider Notes (Signed)
Medical screening examination/treatment/procedure(s) were performed by non-physician practitioner and as supervising physician I was immediately available for consultation/collaboration.  Geoffery Lyons, MD 05/23/12 [phone]

## 2012-07-09 ENCOUNTER — Encounter (HOSPITAL_COMMUNITY): Payer: Self-pay | Admitting: Emergency Medicine

## 2012-07-09 ENCOUNTER — Emergency Department (HOSPITAL_COMMUNITY)
Admission: EM | Admit: 2012-07-09 | Discharge: 2012-07-09 | Disposition: A | Discharge status: Home / Self Care | Payer: Self-pay | Attending: Emergency Medicine | Admitting: Emergency Medicine

## 2012-07-09 DIAGNOSIS — Z8619 Personal history of other infectious and parasitic diseases: Secondary | ICD-10-CM | POA: Insufficient documentation

## 2012-07-09 DIAGNOSIS — R51 Headache: Secondary | ICD-10-CM | POA: Insufficient documentation

## 2012-07-09 DIAGNOSIS — R05 Cough: Secondary | ICD-10-CM | POA: Insufficient documentation

## 2012-07-09 DIAGNOSIS — R112 Nausea with vomiting, unspecified: Secondary | ICD-10-CM | POA: Insufficient documentation

## 2012-07-09 DIAGNOSIS — R0789 Other chest pain: Secondary | ICD-10-CM | POA: Insufficient documentation

## 2012-07-09 DIAGNOSIS — IMO0001 Reserved for inherently not codable concepts without codable children: Secondary | ICD-10-CM | POA: Insufficient documentation

## 2012-07-09 DIAGNOSIS — Z8632 Personal history of gestational diabetes: Secondary | ICD-10-CM | POA: Insufficient documentation

## 2012-07-09 DIAGNOSIS — R6889 Other general symptoms and signs: Secondary | ICD-10-CM

## 2012-07-09 DIAGNOSIS — R059 Cough, unspecified: Secondary | ICD-10-CM | POA: Insufficient documentation

## 2012-07-09 DIAGNOSIS — Z3202 Encounter for pregnancy test, result negative: Secondary | ICD-10-CM | POA: Insufficient documentation

## 2012-07-09 DIAGNOSIS — J45909 Unspecified asthma, uncomplicated: Secondary | ICD-10-CM | POA: Insufficient documentation

## 2012-07-09 DIAGNOSIS — R5381 Other malaise: Secondary | ICD-10-CM | POA: Insufficient documentation

## 2012-07-09 DIAGNOSIS — Z87440 Personal history of urinary (tract) infections: Secondary | ICD-10-CM | POA: Insufficient documentation

## 2012-07-09 DIAGNOSIS — R52 Pain, unspecified: Secondary | ICD-10-CM | POA: Insufficient documentation

## 2012-07-09 DIAGNOSIS — R509 Fever, unspecified: Secondary | ICD-10-CM | POA: Insufficient documentation

## 2012-07-09 DIAGNOSIS — Z8719 Personal history of other diseases of the digestive system: Secondary | ICD-10-CM | POA: Insufficient documentation

## 2012-07-09 DIAGNOSIS — Z862 Personal history of diseases of the blood and blood-forming organs and certain disorders involving the immune mechanism: Secondary | ICD-10-CM | POA: Insufficient documentation

## 2012-07-09 LAB — COMPREHENSIVE METABOLIC PANEL
Albumin: 4.3 g/dL (ref 3.5–5.2)
BUN: 12 mg/dL (ref 6–23)
Calcium: 9.2 mg/dL (ref 8.4–10.5)
Creatinine, Ser: 0.56 mg/dL (ref 0.50–1.10)
GFR calc Af Amer: >90 mL/min (ref >90)
Glucose, Bld: 138 mg/dL — ABNORMAL HIGH (ref 70–99)
Total Protein: 7.7 g/dL (ref 6.0–8.3)

## 2012-07-09 LAB — CBC WITH DIFFERENTIAL/PLATELET
Basophils Relative: 0 % (ref 0–1)
Eosinophils Absolute: 0 10*3/uL (ref 0.0–0.7)
Eosinophils Relative: 0 % (ref 0–5)
Hemoglobin: 14.9 g/dL (ref 12.0–15.0)
Lymphs Abs: 1.3 10*3/uL (ref 0.7–4.0)
MCH: 30.5 pg (ref 26.0–34.0)
MCHC: 37 g/dL — ABNORMAL HIGH (ref 30.0–36.0)
MCV: 82.4 fL (ref 78.0–100.0)
Monocytes Relative: 5 % (ref 3–12)
RBC: 4.89 MIL/uL (ref 3.87–5.11)

## 2012-07-09 LAB — URINALYSIS, ROUTINE W REFLEX MICROSCOPIC
Bilirubin Urine: NEGATIVE
Ketones, ur: 15 mg/dL — AB
Nitrite: NEGATIVE
Specific Gravity, Urine: 1.03 (ref 1.005–1.030)
Urobilinogen, UA: 1 mg/dL (ref 0.0–1.0)
pH: 6.5 (ref 5.0–8.0)

## 2012-07-09 LAB — POCT PREGNANCY, URINE: Preg Test, Ur: NEGATIVE

## 2012-07-09 LAB — URINE MICROSCOPIC-ADD ON

## 2012-07-09 MED ORDER — MORPHINE SULFATE 4 MG/ML IJ SOLN: 4.0000 mg | Freq: Once | INTRAMUSCULAR | Status: DC

## 2012-07-09 MED ORDER — ONDANSETRON HCL 4 MG/2ML IJ SOLN: 4.0000 mg | Freq: Once | INTRAMUSCULAR | Status: DC

## 2012-07-09 MED ORDER — KETOROLAC TROMETHAMINE 30 MG/ML IJ SOLN
30.0000 mg | Freq: Once | INTRAMUSCULAR | Status: AC
  Administered 2012-07-09: 30 mg via INTRAVENOUS
  Filled 2012-07-09: qty 1

## 2012-07-09 MED ORDER — SODIUM CHLORIDE 0.9 % IV BOLUS (SEPSIS)
1000.0000 mL | Freq: Once | INTRAVENOUS | Status: AC
  Administered 2012-07-09: 1000 mL via INTRAVENOUS

## 2012-07-09 MED ORDER — HYDROCOD POLST-CHLORPHEN POLST 10-8 MG/5ML PO LQCR
5.0000 mL | Freq: Once | ORAL | Status: AC
  Administered 2012-07-09: 5 mL via ORAL
  Filled 2012-07-09: qty 5

## 2012-07-09 MED ORDER — DIPHENHYDRAMINE HCL 50 MG/ML IJ SOLN
25.0000 mg | Freq: Once | INTRAMUSCULAR | Status: AC
  Administered 2012-07-09: 25 mg via INTRAVENOUS
  Filled 2012-07-09: qty 1

## 2012-07-09 MED ORDER — HYDROCODONE-HOMATROPINE 5-1.5 MG/5ML PO SYRP: 5.0000 mL | ORAL_SOLUTION | Freq: Four times a day (QID) | ORAL | Status: DC | PRN

## 2012-07-09 MED ORDER — METOCLOPRAMIDE HCL 5 MG/ML IJ SOLN
10.0000 mg | Freq: Once | INTRAMUSCULAR | Status: AC
  Administered 2012-07-09: 10 mg via INTRAVENOUS
  Filled 2012-07-09: qty 2

## 2012-07-09 NOTE — ED Provider Notes (Signed)
Medical screening examination/treatment/procedure(s) were performed by non-physician practitioner and as supervising physician I was immediately available for consultation/collaboration.  Jasmine Awe, MD 07/09/12 2303

## 2012-07-09 NOTE — ED Provider Notes (Signed)
History     CSN: [id number]  Arrival date & time 07/09/12  2956   First MD Initiated Contact with Patient 07/09/12 2005      Chief Complaint  Patient presents with  . Generalized Body Aches    (Consider location/radiation/quality/duration/timing/severity/associated sxs/prior treatment) HPI Comments: Christy Jones is a 27 y.o. female that presents to the ER w flu like s/s onset migraine 6 days ago. Illness began with low grade fevers, night sweats, myalgias and chills. She developed a migraine similar to her typical migraine 3 days ago, associated with photophobia, nausea, and sound sensitivity. Yesterday she developed sore throat and a non productive cough and today she reports having emesis x2. She denies any abdominal pain,change in bowel movements, chest pain, SOB, wheezing, strider, dysuria, vaginal dc. Pt reports that she may have has a + pregnancy test on Monday, but she started her menstrual cycle yesterday. Patient's last menstrual period was 06/07/2012.   The history is provided by the patient.    Past Medical History  Diagnosis Date  . Asthma   . Umbilical hernia   . Gestational diabetes   . Pregnancy induced hypertension     post- partem 2012  . Sickle cell trait   . Urinary tract infection   . Headache   . Chlamydia   . Trichomonas     Past Surgical History  Procedure Date  . Exploratory laparotomy     removal of ectopic pregnancy  . Ectopic pregnancy surgery     Family History  Problem Relation Age of Onset  . Asthma Maternal Grandmother   . Diabetes Maternal Grandmother   . Anesthesia problems Neg Hx     History  Substance Use Topics  . Smoking status: Current Some Day Smoker -- 0.2 packs/day for 10 years  . Smokeless tobacco: Never Used  . Alcohol Use: No    OB History    Grav Para Term Preterm Abortions TAB SAB Ect Mult Living   5 2 2  0 2 0 1 1 0 2      Review of Systems  Constitutional: Positive for fever, chills and fatigue.  HENT:  Negative for ear pain, congestion, sore throat, rhinorrhea, sneezing, neck pain, neck stiffness, sinus pressure and tinnitus.   Eyes: Negative for visual disturbance.  Respiratory: Positive for cough and chest tightness.   Cardiovascular: Negative for chest pain and palpitations.  Gastrointestinal: Positive for nausea and vomiting. Negative for abdominal pain and diarrhea.  Genitourinary: Negative for dysuria.  Musculoskeletal: Positive for myalgias.  Skin: Negative for color change and rash.  Neurological: Positive for headaches. Negative for dizziness and weakness.  Hematological: Does not bruise/bleed easily.  Psychiatric/Behavioral: Negative for confusion.  All other systems reviewed and are negative.    Allergies  Lactose intolerance (gi)  Home Medications   Current Outpatient Rx  Name  Route  Sig  Dispense  Refill  . ACETAMINOPHEN 325 MG PO TABS   Oral   Take 650 mg by mouth every 6 (six) hours as needed. For pain or fever         . HYDROCODONE-ACETAMINOPHEN 5-325 MG PO TABS   Oral   Take 1 tablet by mouth every 6 (six) hours as needed. For pain           BP 124/86  Pulse 63  Temp 97.8 F (36.6 C) (Oral)  Resp 16  SpO2 100%  LMP 06/07/2012  Physical Exam  Nursing note and vitals reviewed. Constitutional: She is oriented to person,  place, and time. She appears well-developed and well-nourished. No distress.  HENT:  Head: Normocephalic and atraumatic.       Normal Oropharynx with out exudates, MMM. Ear canal and TM normal bilaterally. Nasal congestion without sinus tenderness.   Eyes: Conjunctivae normal and EOM are normal. Pupils are equal, round, and reactive to light.  Neck: Neck supple.       No nuchal rigidity, full normal ROM, pt can breath in extension without difficulty   Cardiovascular: Regular rhythm.        Tachycardic, other heart sounds normal  Pulmonary/Chest: Effort normal.       LCAB  Abdominal: Soft. Bowel sounds are normal.    Musculoskeletal: She exhibits no edema.       Diffuse myalgias, no bony ttp or decreased ROM  Neurological: She is oriented to person, place, and time.  Skin:       No rash, skin warm & non diaphoretic    ED Course  Procedures (including critical care time)  Labs Reviewed  URINALYSIS, ROUTINE W REFLEX MICROSCOPIC - Abnormal; Notable for the following:    Color, Urine AMBER (*)  BIOCHEMICALS MAY BE AFFECTED BY COLOR   Hgb urine dipstick TRACE (*)     Ketones, ur 15 (*)     Protein, ur 30 (*)     Leukocytes, UA TRACE (*)     All other components within normal limits  CBC WITH DIFFERENTIAL - Abnormal; Notable for the following:    MCHC 37.0 (*)     Neutrophils Relative 82 (*)     Neutro Abs 8.1 (*)     All other components within normal limits  COMPREHENSIVE METABOLIC PANEL - Abnormal; Notable for the following:    Glucose, Bld 138 (*)     All other components within normal limits  URINE MICROSCOPIC-ADD ON - Abnormal; Notable for the following:    Squamous Epithelial / LPF FEW (*)     All other components within normal limits  POCT PREGNANCY, URINE   No results found.   No diagnosis found.  Pt pregnancy test negative in the ER, pain medications and migraine treatment ordered. Discussed results with pt and advised follow up with OBGYN.   MDM  Flu like symptoms  Patient with symptoms consistent with influenza.  Vitals are stable, low-grade fever.  No signs of dehydration, tolerating PO's.  Lungs are clear. Due to patient's presentation and physical exam a chest x-ray was not ordered bc likely diagnosis of flu.  Discussed the cost versus benefit of Tamiflu treatment with the patient.  The patient understands that symptoms are greater than the recommended 24-48 hour window of treatment.  Patient will be discharged with instructions to orally hydrate, rest, and use over-the-counter medications such as anti-inflammatories ibuprofen and Aleve for muscle aches and Tylenol for fever.   Patient will also be given a cough suppressant.          Jaci Carrel, [place] 07/09/12 2152

## 2012-07-09 NOTE — ED Notes (Signed)
Pt discharged.Vital signs stable and GCS 15 

## 2012-07-09 NOTE — ED Notes (Signed)
Pt states N/V, cold sweats, migraine, cough x 2 days. A&Ox4, ambulatory, nad.

## 2012-07-09 NOTE — ED Notes (Signed)
Pt st's she has had a migraine headache x's 3 days, started having nausea and vomiting yesterday.  Also st's she had a + home preg test on Mon. And started having vag. Bleeding yesterday.  Pt st's she has had elevated temp since yesterday

## 2012-07-12 ENCOUNTER — Encounter (HOSPITAL_COMMUNITY): Payer: Self-pay | Admitting: Nurse Practitioner

## 2012-07-12 ENCOUNTER — Emergency Department (HOSPITAL_COMMUNITY)
Admission: EM | Admit: 2012-07-12 | Discharge: 2012-07-12 | Disposition: A | Discharge status: Home / Self Care | Payer: Self-pay | Attending: Emergency Medicine | Admitting: Emergency Medicine

## 2012-07-12 DIAGNOSIS — Z3202 Encounter for pregnancy test, result negative: Secondary | ICD-10-CM | POA: Insufficient documentation

## 2012-07-12 DIAGNOSIS — R5383 Other fatigue: Secondary | ICD-10-CM | POA: Insufficient documentation

## 2012-07-12 DIAGNOSIS — Z87440 Personal history of urinary (tract) infections: Secondary | ICD-10-CM | POA: Insufficient documentation

## 2012-07-12 DIAGNOSIS — R51 Headache: Secondary | ICD-10-CM | POA: Insufficient documentation

## 2012-07-12 DIAGNOSIS — R6883 Chills (without fever): Secondary | ICD-10-CM | POA: Insufficient documentation

## 2012-07-12 DIAGNOSIS — R6889 Other general symptoms and signs: Secondary | ICD-10-CM

## 2012-07-12 DIAGNOSIS — R5381 Other malaise: Secondary | ICD-10-CM | POA: Insufficient documentation

## 2012-07-12 DIAGNOSIS — F17200 Nicotine dependence, unspecified, uncomplicated: Secondary | ICD-10-CM | POA: Insufficient documentation

## 2012-07-12 DIAGNOSIS — Z8679 Personal history of other diseases of the circulatory system: Secondary | ICD-10-CM | POA: Insufficient documentation

## 2012-07-12 DIAGNOSIS — R05 Cough: Secondary | ICD-10-CM | POA: Insufficient documentation

## 2012-07-12 DIAGNOSIS — R059 Cough, unspecified: Secondary | ICD-10-CM | POA: Insufficient documentation

## 2012-07-12 DIAGNOSIS — D573 Sickle-cell trait: Secondary | ICD-10-CM | POA: Insufficient documentation

## 2012-07-12 DIAGNOSIS — R509 Fever, unspecified: Secondary | ICD-10-CM | POA: Insufficient documentation

## 2012-07-12 DIAGNOSIS — IMO0001 Reserved for inherently not codable concepts without codable children: Secondary | ICD-10-CM | POA: Insufficient documentation

## 2012-07-12 DIAGNOSIS — R112 Nausea with vomiting, unspecified: Secondary | ICD-10-CM | POA: Insufficient documentation

## 2012-07-12 MED ORDER — SODIUM CHLORIDE 0.9 % IV BOLUS (SEPSIS)
500.0000 mL | Freq: Once | INTRAVENOUS | Status: AC
  Administered 2012-07-12: 500 mL via INTRAVENOUS

## 2012-07-12 MED ORDER — KETOROLAC TROMETHAMINE 30 MG/ML IJ SOLN
30.0000 mg | Freq: Once | INTRAMUSCULAR | Status: AC
  Administered 2012-07-12: 30 mg via INTRAVENOUS
  Filled 2012-07-12: qty 1

## 2012-07-12 MED ORDER — SODIUM CHLORIDE 0.9 % IV SOLN
INTRAVENOUS | Status: DC
  Administered 2012-07-12: 125 mL/h via INTRAVENOUS

## 2012-07-12 MED ORDER — SODIUM CHLORIDE 0.9 % IV BOLUS (SEPSIS)
1000.0000 mL | Freq: Once | INTRAVENOUS | Status: AC
  Administered 2012-07-12: 1000 mL via INTRAVENOUS

## 2012-07-12 MED ORDER — ONDANSETRON HCL 4 MG PO TABS: 4.0000 mg | ORAL_TABLET | Freq: Four times a day (QID) | ORAL | Status: DC

## 2012-07-12 MED ORDER — METOCLOPRAMIDE HCL 5 MG/ML IJ SOLN
10.0000 mg | Freq: Once | INTRAMUSCULAR | Status: AC
  Administered 2012-07-12: 10 mg via INTRAVENOUS
  Filled 2012-07-12: qty 2

## 2012-07-12 MED ORDER — DIPHENHYDRAMINE HCL 50 MG/ML IJ SOLN
25.0000 mg | Freq: Once | INTRAMUSCULAR | Status: AC
  Administered 2012-07-12: 25 mg via INTRAVENOUS
  Filled 2012-07-12: qty 1

## 2012-07-12 MED ORDER — ONDANSETRON HCL 4 MG/2ML IJ SOLN
4.0000 mg | Freq: Once | INTRAMUSCULAR | Status: AC
  Administered 2012-07-12: 4 mg via INTRAVENOUS
  Filled 2012-07-12: qty 2

## 2012-07-12 MED ORDER — HYDROCOD POLST-CHLORPHEN POLST 10-8 MG/5ML PO LQCR
5.0000 mL | Freq: Once | ORAL | Status: AC
  Administered 2012-07-12: 5 mL via ORAL
  Filled 2012-07-12: qty 5

## 2012-07-12 NOTE — ED Provider Notes (Signed)
History    Scribed for Carleene Cooper III, MD, the patient was seen in room TR10C/TR10C . This chart was scribed by Lewanda Rife.  CSN: [id number]  Arrival date & time 07/12/12  1449   First MD Initiated Contact with Patient 07/09/12 2005      Chief Complaint  Patient presents with  . URI    (Consider location/radiation/quality/duration/timing/severity/associated sxs/prior Treatment)  Christy Jones is a 27 y.o. female who presents to the Emergency Department complaining of upper respiratory infection onset 9 days. Pt reports she was seen here for the same 3 days ago and could not afford to fill her prescription. Pt reports note from last visit is still relavent for today since her symptoms have not improved or worsened. HPI Comments: Christy Jones is a 27 y.o. female that presents to the ER w flu like s/s onset migraine 6 days ago. Illness began with low grade fevers, night sweats, myalgias and chills. She developed a migraine similar to her typical migraine 3 days ago, associated with photophobia, nausea, and sound sensitivity. Yesterday she developed sore throat and a non productive cough and today she reports having emesis x2. She denies any abdominal pain,change in bowel movements, chest pain, SOB, wheezing, strider, dysuria, vaginal dc. Pt reports that she may have has a + pregnancy test on Monday, but she started her menstrual cycle yesterday. Patient's last menstrual period was 06/07/2012.   URI The primary symptoms include fever, fatigue, headaches, cough, nausea, vomiting and myalgias. Primary symptoms do not include ear pain, sore throat, abdominal pain or rash.  The headache is not associated with neck stiffness or weakness.  The myalgias are not associated with weakness.  Symptoms associated with the illness include chills. The illness is not associated with sinus pressure, congestion or rhinorrhea.  Patient is a 27 y.o. female presenting with URI. The history is  provided by the patient.    Past Medical History  Diagnosis Date  . Asthma   . Umbilical hernia   . Gestational diabetes   . Pregnancy induced hypertension     post- partem 2012  . Sickle cell trait   . Urinary tract infection   . Headache   . Chlamydia   . Trichomonas     Past Surgical History  Procedure Date  . Exploratory laparotomy     removal of ectopic pregnancy  . Ectopic pregnancy surgery     Family History  Problem Relation Age of Onset  . Asthma Maternal Grandmother   . Diabetes Maternal Grandmother   . Anesthesia problems Neg Hx     History  Substance Use Topics  . Smoking status: Current Some Day Smoker -- 0.2 packs/day for 10 years  . Smokeless tobacco: Never Used  . Alcohol Use: No    OB History    Grav Para Term Preterm Abortions TAB SAB Ect Mult Living   5 2 2  0 2 0 1 1 0 2      Review of Systems  Constitutional: Positive for fever, chills and fatigue.  HENT: Negative for ear pain, congestion, sore throat, rhinorrhea, sneezing, neck pain, neck stiffness, sinus pressure and tinnitus.   Eyes: Negative for visual disturbance.  Respiratory: Positive for cough and chest tightness.   Cardiovascular: Negative for chest pain and palpitations.  Gastrointestinal: Positive for nausea and vomiting. Negative for abdominal pain and diarrhea.  Genitourinary: Negative for dysuria.  Musculoskeletal: Positive for myalgias.  Skin: Negative for color change and rash.  Neurological: Positive for headaches.  Negative for dizziness and weakness.  Hematological: Does not bruise/bleed easily.  Psychiatric/Behavioral: Negative for confusion.  All other systems reviewed and are negative.    Allergies  Lactose intolerance (gi)  Home Medications   Current Outpatient Rx  Name  Route  Sig  Dispense  Refill  . ACETAMINOPHEN 325 MG PO TABS   Oral   Take 650 mg by mouth every 6 (six) hours as needed. For pain or fever         . SODIUM & POTASSIUM BICARBONATE PO  TBEF   Oral   Take 1 tablet by mouth daily as needed. For symptoms         . HYDROCODONE-HOMATROPINE 5-1.5 MG/5ML PO SYRP   Oral   Take 5 mLs by mouth every 6 (six) hours as needed for cough.   120 mL   0     BP 111/87  Pulse 70  Temp 98.9 F (37.2 C) (Oral)  SpO2 98%  LMP 06/07/2012  Physical Exam  Nursing note and vitals reviewed. Constitutional: She is oriented to person, place, and time. She appears well-developed and well-nourished. No distress.  HENT:  Head: Normocephalic and atraumatic.       Normal Oropharynx with out exudates, MMM. Ear canal and TM normal bilaterally. Nasal congestion without sinus tenderness.   Eyes: Conjunctivae normal and EOM are normal. Pupils are equal, round, and reactive to light.  Neck: Neck supple.       No nuchal rigidity, full normal ROM, pt can breath in extension without difficulty   Cardiovascular: Normal rate and regular rhythm.   Pulmonary/Chest: Effort normal.       LCAB  Abdominal: Soft. Bowel sounds are normal.  Musculoskeletal: She exhibits no edema.       Diffuse myalgias, no bony ttp or decreased ROM  Neurological: She is oriented to person, place, and time.  Skin:       No rash, skin warm & non diaphoretic    ED Course  Procedures  (including critical care time)  Labs Reviewed - No data to display No results found.   No diagnosis found.  Pt pregnancy test negative in the ER, pain medications and migraine treatment ordered. Discussed results with pt and advised follow up with OBGYN.   MDM  Flu like symptoms  Patient with symptoms consistent with influenza.  Vitals are stable, low-grade fever.  No signs of dehydration, tolerating PO's.  Lungs are clear. Due to patient's presentation and physical exam a chest x-ray was not ordered bc likely diagnosis of flu.  Discussed the cost versus benefit of Tamiflu treatment with the patient.  The patient understands that symptoms are greater than the recommended 24-48 hour  window of treatment.  Patient will be discharged with instructions to orally hydrate, rest, and use over-the-counter medications such as anti-inflammatories ibuprofen and Aleve for muscle aches and Tylenol for fever.  Patient will also be given a cough suppressant.      Jaci Carrel, [place] 07/12/12 2347

## 2012-07-12 NOTE — ED Notes (Signed)
States she was seen here recently and "diagnosed with flu" and symptoms arent getting any better and she could not afford to fill the prescriptions she was given. A&Ox4, resp e/u

## 2012-07-13 NOTE — ED Provider Notes (Signed)
Medical screening examination/treatment/procedure(s) were performed by non-physician practitioner and as supervising physician I was immediately available for consultation/collaboration.   Carleene Cooper III, MD 07/13/12 1236

## 2012-07-30 ENCOUNTER — Ambulatory Visit (HOSPITAL_COMMUNITY)
Admission: EM | Admit: 2012-07-30 | Discharge: 2012-07-31 | Disposition: A | Discharge status: Home / Self Care | Payer: Self-pay | Attending: General Surgery | Admitting: General Surgery

## 2012-07-30 ENCOUNTER — Emergency Department (HOSPITAL_COMMUNITY): Payer: Self-pay

## 2012-07-30 ENCOUNTER — Encounter (HOSPITAL_COMMUNITY): Payer: Self-pay | Admitting: Anesthesiology

## 2012-07-30 ENCOUNTER — Encounter (HOSPITAL_COMMUNITY)
Admission: EM | Disposition: A | Discharge status: Home / Self Care | Payer: Self-pay | Source: Home / Self Care | Attending: Emergency Medicine

## 2012-07-30 ENCOUNTER — Encounter (HOSPITAL_COMMUNITY): Payer: Self-pay | Admitting: General Practice

## 2012-07-30 ENCOUNTER — Emergency Department (HOSPITAL_COMMUNITY): Payer: Self-pay | Admitting: Anesthesiology

## 2012-07-30 ENCOUNTER — Encounter (HOSPITAL_COMMUNITY): Payer: Self-pay | Admitting: Adult Health

## 2012-07-30 DIAGNOSIS — J45909 Unspecified asthma, uncomplicated: Secondary | ICD-10-CM | POA: Insufficient documentation

## 2012-07-30 DIAGNOSIS — K8020 Calculus of gallbladder without cholecystitis without obstruction: Secondary | ICD-10-CM

## 2012-07-30 DIAGNOSIS — I10 Essential (primary) hypertension: Secondary | ICD-10-CM | POA: Insufficient documentation

## 2012-07-30 DIAGNOSIS — K8010 Calculus of gallbladder with chronic cholecystitis without obstruction: Secondary | ICD-10-CM | POA: Insufficient documentation

## 2012-07-30 DIAGNOSIS — R1011 Right upper quadrant pain: Secondary | ICD-10-CM

## 2012-07-30 HISTORY — PX: CHOLECYSTECTOMY: SHX55

## 2012-07-30 LAB — COMPREHENSIVE METABOLIC PANEL
AST: 12 U/L (ref 0–37)
Albumin: 4 g/dL (ref 3.5–5.2)
Calcium: 9.4 mg/dL (ref 8.4–10.5)
Chloride: 104 mEq/L (ref 96–112)
Creatinine, Ser: 0.74 mg/dL (ref 0.50–1.10)
Sodium: 138 mEq/L (ref 135–145)

## 2012-07-30 LAB — CBC WITH DIFFERENTIAL/PLATELET
Eosinophils Absolute: 0.1 10*3/uL (ref 0.0–0.7)
Eosinophils Relative: 1 % (ref 0–5)
Hemoglobin: 14.9 g/dL (ref 12.0–15.0)
Lymphs Abs: 4 10*3/uL (ref 0.7–4.0)
MCH: 30.5 pg (ref 26.0–34.0)
MCV: 82.6 fL (ref 78.0–100.0)
Monocytes Absolute: 0.7 10*3/uL (ref 0.1–1.0)
Monocytes Relative: 6 % (ref 3–12)
RBC: 4.88 MIL/uL (ref 3.87–5.11)

## 2012-07-30 LAB — URINALYSIS, ROUTINE W REFLEX MICROSCOPIC
Bilirubin Urine: NEGATIVE
Glucose, UA: NEGATIVE mg/dL
Hgb urine dipstick: NEGATIVE
Ketones, ur: 15 mg/dL — AB
Protein, ur: NEGATIVE mg/dL

## 2012-07-30 LAB — LIPASE, BLOOD: Lipase: 32 U/L (ref 11–59)

## 2012-07-30 LAB — POCT PREGNANCY, URINE: Preg Test, Ur: NEGATIVE

## 2012-07-30 LAB — CBC
HCT: 37.6 % (ref 36.0–46.0)
Hemoglobin: 14 g/dL (ref 12.0–15.0)
MCH: 30.7 pg (ref 26.0–34.0)
MCHC: 37.2 g/dL — ABNORMAL HIGH (ref 30.0–36.0)
MCV: 82.5 fL (ref 78.0–100.0)
RBC: 4.56 MIL/uL (ref 3.87–5.11)

## 2012-07-30 LAB — CREATININE, SERUM: Creatinine, Ser: 0.58 mg/dL (ref 0.50–1.10)

## 2012-07-30 SURGERY — LAPAROSCOPIC CHOLECYSTECTOMY WITH INTRAOPERATIVE CHOLANGIOGRAM
Anesthesia: General | Site: Abdomen | Wound class: Clean Contaminated

## 2012-07-30 MED ORDER — ONDANSETRON HCL 4 MG/2ML IJ SOLN
INTRAMUSCULAR | Status: DC | PRN
  Administered 2012-07-30: 4 mg via INTRAVENOUS

## 2012-07-30 MED ORDER — BUPIVACAINE-EPINEPHRINE 0.25% -1:200000 IJ SOLN
INTRAMUSCULAR | Status: DC | PRN
  Administered 2012-07-30: 12 mL

## 2012-07-30 MED ORDER — HEPARIN SODIUM (PORCINE) 5000 UNIT/ML IJ SOLN
5000.0000 [IU] | Freq: Three times a day (TID) | INTRAMUSCULAR | Status: DC
  Administered 2012-07-31: 5000 [IU] via SUBCUTANEOUS
  Filled 2012-07-30 (×3): qty 1

## 2012-07-30 MED ORDER — OXYCODONE HCL 5 MG PO TABS: 5.0000 mg | ORAL_TABLET | Freq: Once | ORAL | Status: DC | PRN

## 2012-07-30 MED ORDER — HYDROMORPHONE HCL PF 1 MG/ML IJ SOLN
INTRAMUSCULAR | Status: AC
  Filled 2012-07-30: qty 1

## 2012-07-30 MED ORDER — INFLUENZA VIRUS VACC SPLIT PF IM SUSP
0.5000 mL | INTRAMUSCULAR | Status: AC
  Administered 2012-07-31: 0.5 mL via INTRAMUSCULAR
  Filled 2012-07-30: qty 0.5

## 2012-07-30 MED ORDER — DEXTROSE 5 % IV SOLN
2.0000 g | Freq: Two times a day (BID) | INTRAVENOUS | Status: DC
  Filled 2012-07-30 (×2): qty 2

## 2012-07-30 MED ORDER — 0.9 % SODIUM CHLORIDE (POUR BTL) OPTIME
TOPICAL | Status: DC | PRN
  Administered 2012-07-30: 1000 mL

## 2012-07-30 MED ORDER — ONDANSETRON HCL 4 MG/2ML IJ SOLN: 4.0000 mg | Freq: Three times a day (TID) | INTRAMUSCULAR | Status: DC | PRN

## 2012-07-30 MED ORDER — SODIUM CHLORIDE 0.9 % IR SOLN
Status: DC | PRN
  Administered 2012-07-30: 1000 mL

## 2012-07-30 MED ORDER — OXYCODONE HCL 5 MG/5ML PO SOLN: 5.0000 mg | Freq: Once | ORAL | Status: DC | PRN

## 2012-07-30 MED ORDER — MIDAZOLAM HCL 5 MG/5ML IJ SOLN
INTRAMUSCULAR | Status: DC | PRN
  Administered 2012-07-30: 2 mg via INTRAVENOUS

## 2012-07-30 MED ORDER — PROMETHAZINE HCL 25 MG/ML IJ SOLN: 6.2500 mg | INTRAMUSCULAR | Status: DC | PRN

## 2012-07-30 MED ORDER — LACTATED RINGERS IV SOLN
INTRAVENOUS | Status: DC
  Administered 2012-07-30: 08:00:00 via INTRAVENOUS

## 2012-07-30 MED ORDER — KETOROLAC TROMETHAMINE 30 MG/ML IJ SOLN
30.0000 mg | Freq: Once | INTRAMUSCULAR | Status: AC
  Administered 2012-07-30: 30 mg via INTRAVENOUS
  Filled 2012-07-30: qty 1

## 2012-07-30 MED ORDER — DEXTROSE 5 % IV SOLN
2.0000 g | Freq: Once | INTRAVENOUS | Status: AC
  Administered 2012-07-30: 2 g via INTRAVENOUS
  Filled 2012-07-30: qty 2

## 2012-07-30 MED ORDER — ONDANSETRON HCL 4 MG/2ML IJ SOLN: 4.0000 mg | Freq: Four times a day (QID) | INTRAMUSCULAR | Status: DC | PRN

## 2012-07-30 MED ORDER — LACTATED RINGERS IV SOLN
INTRAVENOUS | Status: DC | PRN
  Administered 2012-07-30 (×2): via INTRAVENOUS

## 2012-07-30 MED ORDER — GLYCOPYRROLATE 0.2 MG/ML IJ SOLN
INTRAMUSCULAR | Status: DC | PRN
  Administered 2012-07-30: 0.4 mg via INTRAVENOUS

## 2012-07-30 MED ORDER — POTASSIUM CHLORIDE IN NACL 20-0.9 MEQ/L-% IV SOLN
INTRAVENOUS | Status: DC
  Administered 2012-07-30: 1000 mL via INTRAVENOUS
  Administered 2012-07-31: 02:00:00 via INTRAVENOUS
  Filled 2012-07-30 (×3): qty 1000

## 2012-07-30 MED ORDER — NEOSTIGMINE METHYLSULFATE 1 MG/ML IJ SOLN
INTRAMUSCULAR | Status: DC | PRN
  Administered 2012-07-30: 3 mg via INTRAVENOUS

## 2012-07-30 MED ORDER — MORPHINE SULFATE 2 MG/ML IJ SOLN
2.0000 mg | INTRAMUSCULAR | Status: DC | PRN
  Administered 2012-07-30 – 2012-07-31 (×4): 2 mg via INTRAVENOUS
  Filled 2012-07-30 (×4): qty 1

## 2012-07-30 MED ORDER — LACTATED RINGERS IV BOLUS (SEPSIS)
500.0000 mL | Freq: Once | INTRAVENOUS | Status: AC
  Administered 2012-07-30: 500 mL via INTRAVENOUS

## 2012-07-30 MED ORDER — LIDOCAINE HCL (CARDIAC) 20 MG/ML IV SOLN
INTRAVENOUS | Status: DC | PRN
  Administered 2012-07-30: 80 mg via INTRAVENOUS

## 2012-07-30 MED ORDER — MORPHINE SULFATE 2 MG/ML IJ SOLN: 1.0000 mg | INTRAMUSCULAR | Status: DC | PRN

## 2012-07-30 MED ORDER — MEPERIDINE HCL 25 MG/ML IJ SOLN: 6.2500 mg | INTRAMUSCULAR | Status: DC | PRN

## 2012-07-30 MED ORDER — DEXAMETHASONE SODIUM PHOSPHATE 4 MG/ML IJ SOLN
INTRAMUSCULAR | Status: DC | PRN
  Administered 2012-07-30: 4 mg via INTRAVENOUS

## 2012-07-30 MED ORDER — HYDROMORPHONE HCL PF 1 MG/ML IJ SOLN
0.2500 mg | INTRAMUSCULAR | Status: DC | PRN
  Administered 2012-07-30 (×2): 0.5 mg via INTRAVENOUS

## 2012-07-30 MED ORDER — ONDANSETRON HCL 4 MG PO TABS: 4.0000 mg | ORAL_TABLET | Freq: Four times a day (QID) | ORAL | Status: DC | PRN

## 2012-07-30 MED ORDER — PROPOFOL 10 MG/ML IV BOLUS
INTRAVENOUS | Status: DC | PRN
  Administered 2012-07-30: 200 mg via INTRAVENOUS

## 2012-07-30 MED ORDER — SODIUM CHLORIDE 0.9 % IV SOLN
INTRAVENOUS | Status: DC | PRN
  Administered 2012-07-30: 12:00:00

## 2012-07-30 MED ORDER — HYDROCODONE-ACETAMINOPHEN 5-325 MG PO TABS
1.0000 | ORAL_TABLET | ORAL | Status: DC | PRN
  Administered 2012-07-30 – 2012-07-31 (×4): 2 via ORAL
  Filled 2012-07-30 (×4): qty 2

## 2012-07-30 MED ORDER — FENTANYL CITRATE 0.05 MG/ML IJ SOLN
INTRAMUSCULAR | Status: DC | PRN
  Administered 2012-07-30 (×2): 100 ug via INTRAVENOUS
  Administered 2012-07-30: 50 ug via INTRAVENOUS

## 2012-07-30 MED ORDER — ROCURONIUM BROMIDE 100 MG/10ML IV SOLN
INTRAVENOUS | Status: DC | PRN
  Administered 2012-07-30: 50 mg via INTRAVENOUS

## 2012-07-30 SURGICAL SUPPLY — 34 items
APPLIER CLIP ROT 10 11.4 M/L (STAPLE) ×2
CANISTER SUCTION 2500CC (MISCELLANEOUS) ×2 IMPLANT
CHLORAPREP W/TINT 26ML (MISCELLANEOUS) ×2 IMPLANT
CLIP APPLIE ROT 10 11.4 M/L (STAPLE) ×1 IMPLANT
CLOTH BEACON ORANGE TIMEOUT ST (SAFETY) ×2 IMPLANT
COVER MAYO STAND STRL (DRAPES) ×2 IMPLANT
COVER SURGICAL LIGHT HANDLE (MISCELLANEOUS) ×2 IMPLANT
DECANTER SPIKE VIAL GLASS SM (MISCELLANEOUS) ×4 IMPLANT
DERMABOND ADVANCED (GAUZE/BANDAGES/DRESSINGS) ×1
DERMABOND ADVANCED .7 DNX12 (GAUZE/BANDAGES/DRESSINGS) ×1 IMPLANT
DRAPE C-ARM 42X72 X-RAY (DRAPES) ×2 IMPLANT
DRAPE UTILITY 15X26 W/TAPE STR (DRAPE) ×4 IMPLANT
ELECT REM PT RETURN 9FT ADLT (ELECTROSURGICAL) ×2
ELECTRODE REM PT RTRN 9FT ADLT (ELECTROSURGICAL) ×1 IMPLANT
GLOVE EUDERMIC 7 POWDERFREE (GLOVE) ×2 IMPLANT
GOWN PREVENTION PLUS XLARGE (GOWN DISPOSABLE) ×2 IMPLANT
GOWN STRL NON-REIN LRG LVL3 (GOWN DISPOSABLE) ×6 IMPLANT
KIT BASIN OR (CUSTOM PROCEDURE TRAY) ×2 IMPLANT
KIT ROOM TURNOVER OR (KITS) ×2 IMPLANT
NS IRRIG 1000ML POUR BTL (IV SOLUTION) ×2 IMPLANT
PAD ARMBOARD 7.5X6 YLW CONV (MISCELLANEOUS) ×2 IMPLANT
POUCH SPECIMEN RETRIEVAL 10MM (ENDOMECHANICALS) ×2 IMPLANT
SCISSORS LAP 5X35 DISP (ENDOMECHANICALS) ×2 IMPLANT
SET CHOLANGIOGRAPH 5 50 .035 (SET/KITS/TRAYS/PACK) ×2 IMPLANT
SET IRRIG TUBING LAPAROSCOPIC (IRRIGATION / IRRIGATOR) ×2 IMPLANT
SLEEVE ENDOPATH XCEL 5M (ENDOMECHANICALS) ×2 IMPLANT
SPECIMEN JAR SMALL (MISCELLANEOUS) ×2 IMPLANT
SUT MNCRL AB 4-0 PS2 18 (SUTURE) ×2 IMPLANT
TOWEL OR 17X24 6PK STRL BLUE (TOWEL DISPOSABLE) ×2 IMPLANT
TOWEL OR 17X26 10 PK STRL BLUE (TOWEL DISPOSABLE) ×2 IMPLANT
TRAY LAPAROSCOPIC (CUSTOM PROCEDURE TRAY) ×2 IMPLANT
TROCAR XCEL BLUNT TIP 100MML (ENDOMECHANICALS) ×2 IMPLANT
TROCAR XCEL NON-BLD 11X100MML (ENDOMECHANICALS) ×2 IMPLANT
TROCAR XCEL NON-BLD 5MMX100MML (ENDOMECHANICALS) ×2 IMPLANT

## 2012-07-30 NOTE — Op Note (Signed)
Patient Name:           Christy Jones   Date of Surgery:        07/30/2012  Pre op Diagnosis:      Chronic cholecystitis with cholelithiasis  Post op Diagnosis:    Same  Procedure:                 Laparoscopic cholecystectomy with cholangiogram  Surgeon:                     Angelia Mould. Derrell Lolling, M.D., FACS  Assistant:                      RN  Operative Indications:   27 y.o. G3P2A1 female with a past medical history significant for umbilical hernia, sickle cell trait, asthma,History ectopic pregnancy as well as cholelithiasis who was evaluated last November for gallstones and was supposed to followup with surgery. She said she didn't followup . Patient says this evening she had acute onset of right upper quadrant pain about 10:00 while attempting to lay down. She says this radiates to her back, she says it severe, 10 out of 10, sharp.  It sounds like she's been having biliary colic for 1 year.  Workups showed cholelithiasis and mild leukocytosis.She wanted to come in the hospital and go ahead with cholecystectomy.   Operative Findings:       The patient had chronic cholecystitis. The anatomy of the cystic duct cystic artery and common bile duct were conventional. The cholangiogram was normal, showing normal intrahepatic and extrahepatic biliary anatomy, no ductal dilatation, no filling defect, and no obstruction with good flow of contrast into the duodenum  Procedure in Detail:          Following the induction of general endotracheal anesthesia the patient's abdomen was prepped and draped in a sterile fashion. Surgical time out was performed. She had received intravenous antibiotics in the emergency room. 0.5% Marcaine with epinephrine was used as a local infiltration anesthetic. A vertically oriented incision was made just below the umbilicus. The fascia was incised in the midline and the abdominal cavity entered under direct vision. An 11 mm Hassan trocar was inserted and secured with a   pursestring suture of 0 Vicryl. Pneumoperitoneum was created and a video camera inserted .  An 11 mm trocar was placed in the subxiphoid region and two 5 mm trocars placed in the right upper quadrant. I lifted up the fundus of the gallbladder. We took all the adhesions down. We dissected out the cystic duct and cystic artery. A cholangiogram catheter was inserted into the cystic duct, a cholangiogram was obtained with the C-arm, and after noting that the cholangiogram was normal, we removed the cholangiocatheter and secure the cystic duct with multiple hemoclips and divided it. We isolated the cystic artery and secured it with metal clips and divided it. We dissected the gallbladder from its bed with electrocautery, placed it in a specimen bag and removed it. The operative field was extensively irrigated. It was very clean. There was no bleeding and no bile leak. The trocars were removed. There was no bleeding. The pneumoperitoneum was released. The fascia at the umbilicus was closed with 0 Vicryl sutures and the skin incisions closed with subcuticular stitches of 4-0 Monocryl and Dermabond. The patient tolerated the procedure well was taken to recovery stable condition. EBL 10 cc. Counts correct. Complications none.     Angelia Mould. Derrell Lolling, M.D., FACS General  and Minimally Invasive Surgery Breast and Colorectal Surgery  07/30/2012 12:51 PM

## 2012-07-30 NOTE — Anesthesia Procedure Notes (Signed)
Procedure Name: Intubation Date/Time: 07/30/2012 11:45 AM Performed by: Jerilee Hoh Pre-anesthesia Checklist: Patient identified, Emergency Drugs available, Suction available and Patient being monitored Patient Re-evaluated:Patient Re-evaluated prior to inductionOxygen Delivery Method: Circle system utilized Preoxygenation: Pre-oxygenation with 100% oxygen Intubation Type: IV induction Ventilation: Mask ventilation without difficulty Laryngoscope Size: Mac and 3 Grade View: Grade I Tube type: Oral Tube size: 7.0 mm Number of attempts: 1 Airway Equipment and Method: Stylet Placement Confirmation: ETT inserted through vocal cords under direct vision,  positive ETCO2 and breath sounds checked- equal and bilateral Secured at: 22 cm Tube secured with: Tape Dental Injury: Teeth and Oropharynx as per pre-operative assessment

## 2012-07-30 NOTE — Transfer of Care (Signed)
Immediate Anesthesia Transfer of Care Note  Patient: Christy Jones  Procedure(s) Performed: Procedure(s) (LRB) with comments: LAPAROSCOPIC CHOLECYSTECTOMY WITH INTRAOPERATIVE CHOLANGIOGRAM (N/A)  Patient Location: PACU  Anesthesia Type:General  Level of Consciousness: awake, alert , oriented and patient cooperative  Airway & Oxygen Therapy: Patient Spontanous Breathing and Patient connected to nasal cannula oxygen  Post-op Assessment: Report given to PACU RN, Post -op Vital signs reviewed and stable and Patient moving all extremities  Post vital signs: Reviewed and stable  Complications: No apparent anesthesia complications

## 2012-07-30 NOTE — Anesthesia Preprocedure Evaluation (Signed)
Anesthesia Evaluation  Patient identified by MRN, date of birth, ID band Patient awake    Reviewed: Allergy & Precautions, H&P , NPO status , Patient's Chart, lab work & pertinent test results  History of Anesthesia Complications Negative for: history of anesthetic complications  Airway Mallampati: I      Dental No notable dental hx. (+) Teeth Intact   Pulmonary asthma ,    Pulmonary exam normal       Cardiovascular hypertension, IRhythm:regular Rate:Normal     Neuro/Psych negative neurological ROS  negative psych ROS   GI/Hepatic negative GI ROS, Neg liver ROS,   Endo/Other  negative endocrine ROSc pregnacny  Renal/GU negative Renal ROS  negative genitourinary   Musculoskeletal   Abdominal   Peds  Hematology negative hematology ROS (+)   Anesthesia Other Findings   Reproductive/Obstetrics negative OB ROS                           Anesthesia Physical Anesthesia Plan  ASA: II  Anesthesia Plan: General and General ETT   Post-op Pain Management:    Induction:   Airway Management Planned:   Additional Equipment:   Intra-op Plan:   Post-operative Plan:   Informed Consent: I have reviewed the patients History and Physical, chart, labs and discussed the procedure including the risks, benefits and alternatives for the proposed anesthesia with the patient or authorized representative who has indicated his/her understanding and acceptance.     Plan Discussed with: CRNA and Surgeon  Anesthesia Plan Comments:         Anesthesia Quick Evaluation

## 2012-07-30 NOTE — Anesthesia Postprocedure Evaluation (Signed)
  Anesthesia Post-op Note  Patient: Christy Jones  Procedure(s) Performed: Procedure(s) (LRB) with comments: LAPAROSCOPIC CHOLECYSTECTOMY WITH INTRAOPERATIVE CHOLANGIOGRAM (N/A)  Patient Location: PACU  Anesthesia Type:General  Level of Consciousness: awake  Airway and Oxygen Therapy: Patient Spontanous Breathing  Post-op Pain: mild  Post-op Assessment: Post-op Vital signs reviewed  Post-op Vital Signs: stable  Complications: No apparent anesthesia complications

## 2012-07-30 NOTE — Preoperative (Signed)
Beta Blockers   Reason not to administer Beta Blockers:Not Applicable 

## 2012-07-30 NOTE — ED Notes (Signed)
Pt given 2 warm blankets

## 2012-07-30 NOTE — ED Notes (Signed)
Pt reports sudden onset of right upper quadrant pain that began suddenly at 10 pm while trying to lie down. Pain radiates to back. Pt is tearful and unable to sit still. Denies nausea, denies vomiting.

## 2012-07-30 NOTE — Interval H&P Note (Signed)
History and Physical Interval Note:  07/30/2012 11:09 AM  Christy Jones  has presented today for surgery, with the diagnosis of /  The various methods of treatment have been discussed with the patient and family. After consideration of risks, benefits and other options for treatment, the patient has consented to  Procedure(s) (LRB) with comments: LAPAROSCOPIC CHOLECYSTECTOMY WITH INTRAOPERATIVE CHOLANGIOGRAM (N/A) as a surgical intervention .  The patient's history has been reviewed, patient examined, no change in status, stable for surgery.  I have reviewed the patient's chart and labs.  Questions were answered to the patient's satisfaction.     Ernestene Mention

## 2012-07-30 NOTE — H&P (Addendum)
Christy Jones is an 27 y.o. female.   Referring physician:  Dr. Jones Skene Chief Complaint: RUQ abdominal pain HPI: 27 y.o. G65P2A1 female with a past medical history significant for umbilical hernia, sickle cell trait, asthma,  as well as cholelithiasis who was evaluated last November for gallstones and was supposed to followup with surgery. She said she didn't followup and cannot remember the name of the surgeon or what the medical plan was. Patient says this evening she had acute onset of right upper quadrant pain about 10:00 while attempting to lay down. She says this radiates to her back, she says it severe, 10 out of 10, sharp, she says she there is no vomiting or associated diarrhea. Likewise she denies any fevers, chills, chest pain, shortness of breath, dysuria.  Workups showed cholelithiasis and mild leukocytosis  Past Medical History  Diagnosis Date  . Asthma   . Umbilical hernia   . Gestational diabetes   . Pregnancy induced hypertension     post- partem 2012  . Sickle cell trait   . Urinary tract infection   . Headache   . Chlamydia   . Trichomonas     Past Surgical History  Procedure Date  . Exploratory laparotomy     removal of ectopic pregnancy  . Ectopic pregnancy surgery     Family History  Problem Relation Age of Onset  . Asthma Maternal Grandmother   . Diabetes Maternal Grandmother   . Anesthesia problems Neg Hx    Social History:  reports that she has been smoking.  She has never used smokeless tobacco. She reports that she does not drink alcohol or use illicit drugs.  Allergies:  Allergies  Allergen Reactions  . Lactose Intolerance (Gi) Nausea And Vomiting     (Not in a hospital admission)  Results for orders placed during the hospital encounter of 07/30/12 (from the past 48 hour(s))  LIPASE, BLOOD     Status: Normal   Collection Time   07/30/12  2:00 AM      Component Value Range Comment   Lipase 32  11 - 59 U/L   COMPREHENSIVE METABOLIC  PANEL     Status: Abnormal   Collection Time   07/30/12  2:00 AM      Component Value Range Comment   Sodium 138  135 - 145 mEq/L    Potassium 3.9  3.5 - 5.1 mEq/L    Chloride 104  96 - 112 mEq/L    CO2 27  19 - 32 mEq/L    Glucose, Bld 109 (*) 70 - 99 mg/dL    BUN 11  6 - 23 mg/dL    Creatinine, Ser 1.61  0.50 - 1.10 mg/dL    Calcium 9.4  8.4 - 09.6 mg/dL    Total Protein 7.2  6.0 - 8.3 g/dL    Albumin 4.0  3.5 - 5.2 g/dL    AST 12  0 - 37 U/L    ALT 8  0 - 35 U/L    Alkaline Phosphatase 82  39 - 117 U/L    Total Bilirubin 0.3  0.3 - 1.2 mg/dL    GFR calc non Af Amer >90  >90 mL/min    GFR calc Af Amer >90  >90 mL/min   CBC WITH DIFFERENTIAL     Status: Abnormal   Collection Time   07/30/12  2:00 AM      Component Value Range Comment   WBC 12.1 (*) 4.0 - 10.5  K/uL    RBC 4.88  3.87 - 5.11 MIL/uL    Hemoglobin 14.9  12.0 - 15.0 g/dL    HCT 78.2  95.6 - 21.3 %    MCV 82.6  78.0 - 100.0 fL    MCH 30.5  26.0 - 34.0 pg    MCHC 37.0 (*) 30.0 - 36.0 g/dL    RDW 08.6  57.8 - 46.9 %    Platelets 188  150 - 400 K/uL    Neutrophils Relative 60  43 - 77 %    Neutro Abs 7.2  1.7 - 7.7 K/uL    Lymphocytes Relative 34  12 - 46 %    Lymphs Abs 4.0  0.7 - 4.0 K/uL    Monocytes Relative 6  3 - 12 %    Monocytes Absolute 0.7  0.1 - 1.0 K/uL    Eosinophils Relative 1  0 - 5 %    Eosinophils Absolute 0.1  0.0 - 0.7 K/uL    Basophils Relative 0  0 - 1 %    Basophils Absolute 0.0  0.0 - 0.1 K/uL   URINALYSIS, ROUTINE W REFLEX MICROSCOPIC     Status: Abnormal   Collection Time   07/30/12  2:52 AM      Component Value Range Comment   Color, Urine YELLOW  YELLOW    APPearance CLOUDY (*) CLEAR    Specific Gravity, Urine 1.031 (*) 1.005 - 1.030    pH 6.5  5.0 - 8.0    Glucose, UA NEGATIVE  NEGATIVE mg/dL    Hgb urine dipstick NEGATIVE  NEGATIVE    Bilirubin Urine NEGATIVE  NEGATIVE    Ketones, ur 15 (*) NEGATIVE mg/dL    Protein, ur NEGATIVE  NEGATIVE mg/dL    Urobilinogen, UA 1.0  0.0 - 1.0  mg/dL    Nitrite NEGATIVE  NEGATIVE    Leukocytes, UA NEGATIVE  NEGATIVE MICROSCOPIC NOT DONE ON URINES WITH NEGATIVE PROTEIN, BLOOD, LEUKOCYTES, NITRITE, OR GLUCOSE <1000 mg/dL.  POCT PREGNANCY, URINE     Status: Normal   Collection Time   07/30/12  2:57 AM      Component Value Range Comment   Preg Test, Ur NEGATIVE  NEGATIVE    US Abdomen Complete  07/30/2012  *RADIOLOGY REPORT*  Clinical Data:  Right upper quadrant pain.  COMPLETE ABDOMINAL ULTRASOUND  Comparison:  05/22/2012  Findings:  Gallbladder:  Multiple shadowing stones are present in the dependent portion of the gallbladder.  Biliary sludge.  No significant wall thickening or pericholecystic edema.  The overall appearance is similar to the previous study.  The Murphy's sign is reportedly positive.  The presence of gallstones with positive Murphy's sign has a high correlation to acute cholecystitis.  Common bile duct:  Normal caliber with measured diameter of 4.9 mm.  Liver:  No focal lesion identified.  Within normal limits in parenchymal echogenicity.  IVC:  Appears normal.  Pancreas:  No focal abnormality seen.  Spleen:  Spleen length measures 8 cm.  Normal parenchymal echotexture.  Right Kidney:  Right kidney measures 9.9 cm length.  No hydronephrosis.  Left Kidney:  Left kidney measures 10.3 cm length.  No hydronephrosis.  Abdominal aorta:  No aneurysm identified.  IMPRESSION: Multiple stones and sludge in the gallbladder.  No gallbladder wall thickening or edema but Murphy's sign is positive.  Findings may correlate with acute cholecystitis in the appropriate clinical setting.   Original Report Authenticated By: Burman Nieves, M.D.     Review of  Systems  Constitutional: Negative for fever and chills.  Respiratory: Negative for cough, shortness of breath and wheezing.   Cardiovascular: Negative for chest pain.  Gastrointestinal: Positive for abdominal pain (RUQ). Negative for heartburn, nausea, vomiting, diarrhea and constipation.   Genitourinary: Negative for dysuria.  Musculoskeletal: Positive for back pain (radiates from RUQ abdomen).  Neurological: Positive for weakness.    Blood pressure 94/56, pulse 88, temperature 98.6 F (37 C), temperature source Oral, resp. rate 18, last menstrual period 06/07/2012, SpO2 99.00%. Physical Exam  Constitutional: She is oriented to person, place, and time. She appears well-developed and well-nourished.  HENT:  Head: Normocephalic and atraumatic.  Cardiovascular: Normal rate and regular rhythm.  Exam reveals no gallop and no friction rub.   No murmur heard. Respiratory: Effort normal and breath sounds normal. No respiratory distress. She has no wheezes. She has no rales.  GI: Soft. Bowel sounds are normal. She exhibits no distension and no mass. There is no hepatosplenomegaly. There is tenderness (RUQ). There is no rigidity, no rebound and no guarding. A hernia (Umbilical) is present.  Neurological: She is alert and oriented to person, place, and time.  Skin: Skin is warm and dry. No erythema.  Psychiatric: She has a normal mood and affect. Her behavior is normal. Judgment and thought content normal.     Assessment/Plan 27 y/o female with cholelithiasis and biliary colic 1.  Admit to Medsurg floor 2.  NPO, IVF, ABX, pain mgt, antiemetics 3.  Plan for Laparoscopic cholecystectomy today  DORT, Brieanne Mignone 07/30/2012, 7:37 AM

## 2012-07-30 NOTE — ED Provider Notes (Addendum)
History     CSN: [id number]  Arrival date & time 07/30/12  0130   First MD Initiated Contact with Patient 07/30/12 [phone]      Chief Complaint  Patient presents with  . Abdominal Pain    (Consider location/radiation/quality/duration/timing/severity/associated sxs/prior treatment) HPIKayvonna A Jones is a 27 y.o. female with a past medical history significant for sickle cell trait as well as cholelithiasis who was evaluated last November for gallstones and was supposed to followup with surgery. She said she didn't followup but cannot remember the name of the surgeon or what the medical plan was. Patient says this evening she had acute onset of right upper quadrant pain about 10:00 while attempting to lay down. She says this radiates to her back, she says it severe, 10 out of 10, sharp, she says she there is no vomiting or associated diarrhea.  Likewise she denies any fevers, chills, chest pain, shortness of breath.   Past Medical History  Diagnosis Date  . Asthma   . Umbilical hernia   . Gestational diabetes   . Pregnancy induced hypertension     post- partem 2012  . Sickle cell trait   . Urinary tract infection   . Headache   . Chlamydia   . Trichomonas     Past Surgical History  Procedure Date  . Exploratory laparotomy     removal of ectopic pregnancy  . Ectopic pregnancy surgery     Family History  Problem Relation Age of Onset  . Asthma Maternal Grandmother   . Diabetes Maternal Grandmother   . Anesthesia problems Neg Hx     History  Substance Use Topics  . Smoking status: Current Some Day Smoker -- 0.2 packs/day for 10 years  . Smokeless tobacco: Never Used  . Alcohol Use: No    OB History    Grav Para Term Preterm Abortions TAB SAB Ect Mult Living   5 2 2  0 2 0 1 1 0 2      Review of Systems At least 10pt or greater review of systems completed and are negative except where specified in the HPI.  Allergies  Lactose intolerance (gi)  Home Medications    Current Outpatient Rx  Name  Route  Sig  Dispense  Refill  . ACETAMINOPHEN 325 MG PO TABS   Oral   Take 650 mg by mouth every 6 (six) hours as needed. For pain or fever         . HYDROCODONE-HOMATROPINE 5-1.5 MG/5ML PO SYRP   Oral   Take 5 mLs by mouth every 6 (six) hours as needed for cough.   120 mL   0   . ONDANSETRON HCL 4 MG PO TABS   Oral   Take 1 tablet (4 mg total) by mouth every 6 (six) hours.   12 tablet   0   . SODIUM & POTASSIUM BICARBONATE PO TBEF   Oral   Take 1 tablet by mouth daily as needed. For symptoms           BP 145/89  Pulse 63  Temp 98.1 F (36.7 C) (Oral)  Resp 18  SpO2 97%  LMP 06/07/2012  Physical Exam  Nursing notes reviewed.  Electronic medical record reviewed. VITAL SIGNS:   Filed Vitals:   07/30/12 0151 07/30/12 0633  BP: 145/89 94/56  Pulse: 63 88  Temp: 98.1 F (36.7 C) 98.6 F (37 C)  TempSrc: Oral Oral  Resp: 18 18  SpO2: 97% 99%   CONSTITUTIONAL:  Awake, oriented, appears non-toxic HENT: Atraumatic, normocephalic, oral mucosa pink and moist, airway patent. Nares patent without drainage. External ears normal. EYES: Conjunctiva clear, EOMI, PERRLA NECK: Trachea midline, non-tender, supple CARDIOVASCULAR: Normal heart rate, Normal rhythm, No murmurs, rubs, gallops PULMONARY/CHEST: Clear to auscultation, no rhonchi, wheezes, or rales. Symmetrical breath sounds. Non-tender. ABDOMINAL: Non-distended, soft, tender to palpation in the right upper quadrant with voluntary guarding and Murphy sign positive.  BS normal. NEUROLOGIC: Non-focal, moving all four extremities, no gross sensory or motor deficits. EXTREMITIES: No clubbing, cyanosis, or edema SKIN: Warm, Dry, No erythema, No rash  ED Course  Procedures (including critical care time)  Labs Reviewed  COMPREHENSIVE METABOLIC PANEL - Abnormal; Notable for the following:    Glucose, Bld 109 (*)     All other components within normal limits  CBC WITH DIFFERENTIAL -  Abnormal; Notable for the following:    WBC 12.1 (*)     MCHC 37.0 (*)     All other components within normal limits  URINALYSIS, ROUTINE W REFLEX MICROSCOPIC - Abnormal; Notable for the following:    APPearance CLOUDY (*)     Specific Gravity, Urine 1.031 (*)     Ketones, ur 15 (*)     All other components within normal limits  LIPASE, BLOOD  POCT PREGNANCY, URINE   US Abdomen Complete  07/30/2012  *RADIOLOGY REPORT*  Clinical Data:  Right upper quadrant pain.  COMPLETE ABDOMINAL ULTRASOUND  Comparison:  05/22/2012  Findings:  Gallbladder:  Multiple shadowing stones are present in the dependent portion of the gallbladder.  Biliary sludge.  No significant wall thickening or pericholecystic edema.  The overall appearance is similar to the previous study.  The Murphy's sign is reportedly positive.  The presence of gallstones with positive Murphy's sign has a high correlation to acute cholecystitis.  Common bile duct:  Normal caliber with measured diameter of 4.9 mm.  Liver:  No focal lesion identified.  Within normal limits in parenchymal echogenicity.  IVC:  Appears normal.  Pancreas:  No focal abnormality seen.  Spleen:  Spleen length measures 8 cm.  Normal parenchymal echotexture.  Right Kidney:  Right kidney measures 9.9 cm length.  No hydronephrosis.  Left Kidney:  Left kidney measures 10.3 cm length.  No hydronephrosis.  Abdominal aorta:  No aneurysm identified.  IMPRESSION: Multiple stones and sludge in the gallbladder.  No gallbladder wall thickening or edema but Murphy's sign is positive.  Findings may correlate with acute cholecystitis in the appropriate clinical setting.   Original Report Authenticated By: Burman Nieves, M.D.      1. Symptomatic cholelithiasis   2. Right upper quadrant pain       MDM  Christy Jones is a 27 y.o. female [name] with right upper quadrant pain and history of cholelithiasis and sickle cell trait.  Suggestion on clinical exam as that she's got acute  cholelithiasis. Laboratory results do not indicate biliary obstruction, LFTs are within normal limits alkaline phosphatase is unremarkable. Patient has a mildly elevated white count at 12.1. Right upper quadrant ultrasound again identifies sludge and gallstones. Patient has a sonographic Murphy's sign as well. Gallbladder wall thickness is within normal limits, no stones are seen in the duct and the duct is within normal limits. No pericystic fluid either-patient has symptomatic cholelithiasis.   Discussed with  surgery-we'll add on 2 g of cefotetan, we will see her in the emergency department. Patient is okay with plans for cholecystectomy today.       John-Adam  Andrian Sabala, MD 07/30/12 1191  Jones Skene, MD 07/30/12 4782

## 2012-07-30 NOTE — ED Notes (Signed)
Called pharmacy for missing medication.

## 2012-07-30 NOTE — ED Notes (Signed)
Patient is resting comfortably. 

## 2012-07-30 NOTE — ED Notes (Signed)
Pt presents for evaluation of sudden onset RUQ pain that began this evening, denies any N/V/D or difficulty urinating.  Pt admits to having white vaginal discharge for the past week.  Tearful upon interview - urine sample collected and IV in place.

## 2012-07-30 NOTE — H&P (Signed)
General surgery attending note:  I have personally interviewed and examined this patient this morning. I discussed her diagnosis and treatment plan with her and her mother who is at the bedside. I agree with the assessment and treatment plan outlined by Ms. Dort, PA.  She is relatively reticent, and not the best historian, Possibly secondary to narcotic administration, but apparently has had some episodes of biliary colic for a year. She has never been seen in our office. Abdominal exam reveals some right upper quadrant tenderness although this is not dramatic. She has a transverse incision below her umbilicus which she states was due to ectopic pregnancy. Ultrasound Shows multiple stones in the gallbladder, no gallbladder wall thickening or inflammation but Murphy sign was sonographically positive. Common bile duct normal.  Assessment: Chronic cholecystitis with cholelithiasis, now with accelerating biliary colic and possible mild acute cholecystitis Sickle-cell trait History ectopic pregnancy Multiple gestations  Plan: Proceed with laparoscopic cholecystectomy with cholangiogram, hopefully today.  IV fluid hydration and antibiotics have been started. I have discussed indications, details, techniques, and numerous risk of the surgery with the patient and her mother. All their questions are answered. They seem to understand all these issues. They agree with this plan.   Angelia Mould. Derrell Lolling, M.D., [facility] General and Minimally invasive Surgery Breast and Colorectal Surgery Office:   [phone] Pager:   [phone]

## 2012-07-30 NOTE — ED Notes (Signed)
Pt resting, no needs at this time.

## 2012-07-30 NOTE — H&P (Signed)
General surgery attending note:  I have interviewed and examined this patient. I agree with the assessment and treatment plan outlined above. See my additional progress Notes.  Angelia Mould. Derrell Lolling, M.D., [facility] General and Minimally invasive Surgery Breast and Colorectal Surgery Office:   [phone] Pager:   [phone]

## 2012-07-31 ENCOUNTER — Encounter (INDEPENDENT_AMBULATORY_CARE_PROVIDER_SITE_OTHER): Payer: Self-pay | Admitting: Internal Medicine

## 2012-07-31 MED ORDER — KETOROLAC TROMETHAMINE 10 MG PO TABS: 10.0000 mg | ORAL_TABLET | Freq: Three times a day (TID) | ORAL | Status: DC

## 2012-07-31 MED ORDER — KETOROLAC TROMETHAMINE 10 MG PO TABS
10.0000 mg | ORAL_TABLET | Freq: Three times a day (TID) | ORAL | Status: DC
  Administered 2012-07-31: 10 mg via ORAL
  Filled 2012-07-31 (×3): qty 1

## 2012-07-31 MED ORDER — HYDROCODONE-ACETAMINOPHEN 5-325 MG PO TABS: 1.0000 | ORAL_TABLET | ORAL | Status: DC | PRN

## 2012-07-31 NOTE — Progress Notes (Signed)
NURSING PROGRESS NOTE  CLARECE DRZEWIECKI [id number] Discharge Data: 07/31/2012 3:45 PM Attending Provider: No att. providers found [id number], Provider, MD     Colbert Ewing to be D/C'd Home per MD order.  Discussed with the patient the After Visit Summary and all questions fully answered. All IV's discontinued with no bleeding noted. All belongings returned to patient for patient to take home.   Last Vital Signs:  Blood pressure 122/82, pulse 67, temperature 98.6 F (37 C), temperature source Oral, resp. rate 16, height 5\' 4"  (1.626 m), weight 70.761 kg (156 lb), last menstrual period 06/07/2012, SpO2 98.00%.  Discharge Medication List   Medication List     As of 07/31/2012  3:45 PM    TAKE these medications         HYDROcodone-acetaminophen 5-325 MG per tablet   Commonly known as: NORCO/VICODIN   Take 1-2 tablets by mouth every 4 (four) hours as needed.      ketorolac 10 MG tablet   Commonly known as: TORADOL   Take 1 tablet (10 mg total) by mouth every 8 (eight) hours.        [place] Zappia, Elmarie Mainland, RN

## 2012-07-31 NOTE — Progress Notes (Signed)
Pt.  Was able to do IS this shift three times.  Each time she blowed up to 1500-1700.  Pt. Encouraged to continue to do IS.

## 2012-07-31 NOTE — Discharge Summary (Signed)
I agree with discharge summary and outpatient plans. Patient was seen earlier today by Dr. Dwain Sarna.   Angelia Mould. Derrell Lolling, M.D., [facility] General and Minimally invasive Surgery Breast and Colorectal Surgery Office:   [phone] Pager:   [phone]

## 2012-07-31 NOTE — Discharge Summary (Signed)
  Physician Discharge Summary  Patient ID: Christy Jones MRN: [id number] DOB/AGE: 1986-01-18 27 y.o.  Admit date: 07/30/2012 Discharge date: 07/31/2012  Admitting Diagnosis: Chronic cholecystitis with cholelithiasis  Discharge Diagnosis Chronic cholecystitis with cholelithiasis  Consultants None  Procedures Laparoscopic Cholecystectomy with [facility]  Hospital Course 27 yr old female who [facility] to [facility] with abdominal pain.  Workup showed Chronic cholecystitis with cholelithiasis.  Patient was admitted and underwent procedure listed above.  Tolerated procedure well and was transferred to the floor.  Diet was advanced as tolerated.  On POD#1, the patient was voiding well, tolerating diet, ambulating well, pain well controlled, vital signs stable, incisions c/d/i and felt stable for discharge home.  Patient will follow up in our office in 2-3 weeks and knows to call with questions or concerns.    Medication List     As of 07/31/2012 12:32 PM    TAKE these medications         HYDROcodone-acetaminophen 5-325 MG per tablet   Commonly known as: NORCO/VICODIN   Take 1-2 tablets by mouth every 4 (four) hours as needed.      ketorolac 10 MG tablet   Commonly known as: TORADOL   Take 1 tablet (10 mg total) by mouth every 8 (eight) hours.             Follow-up Information    Follow up with [facility].   Contact information:   [address] [place] [address] [phone]          Signed: Denny Levy [facility] [phone]  07/31/2012, 12:32 PM

## 2012-07-31 NOTE — Discharge Instructions (Signed)
[facility] ______[facility] °LAPAROSCOPIC SURGERY: POST OP INSTRUCTIONS °Always review your discharge instruction sheet given to you by the facility where your surgery was performed. °IF YOU HAVE DISABILITY OR FAMILY LEAVE FORMS, YOU MUST BRING THEM TO THE OFFICE FOR PROCESSING.   °DO NOT GIVE THEM TO YOUR DOCTOR. ° °1. A prescription for pain medication may be given to you upon discharge.  Take your pain medication as prescribed, if needed.  If narcotic pain medicine is not needed, then you may take acetaminophen (Tylenol) or ibuprofen (Advil) as needed. °2. Take your usually prescribed medications unless otherwise directed. °3. If you need a refill on your pain medication, please contact your pharmacy.  They will contact our office to request authorization. Prescriptions will not be filled after 5pm or on week-ends. °4. You should follow a light diet the first few days after arrival home, such as soup and crackers, etc.  Be sure to include lots of fluids daily. °5. Most patients will experience some swelling and bruising in the area of the incisions.  Ice packs will help.  Swelling and bruising can take several days to resolve.  °6. It is common to experience some constipation if taking pain medication after surgery.  Increasing fluid intake and taking a stool softener (such as Colace) will usually help or prevent this problem from occurring.  A mild laxative (Milk of Magnesia or Miralax) should be taken according to package instructions if there are no bowel movements after 48 hours. °7. Unless discharge instructions indicate otherwise, you may remove your bandages 24-48 hours after surgery, and you may shower at that time.  You may have steri-strips (small skin tapes) in place directly over the incision.  These strips should be left on the skin for 7-10 days.  If your surgeon used skin glue on the incision, you may shower in 24 hours.  The glue will flake off over the next 2-3 weeks.  Any sutures or  staples will be removed at the office during your follow-up visit. °8. ACTIVITIES:  You may resume regular (light) daily activities beginning the next day--such as daily self-care, walking, climbing stairs--gradually increasing activities as tolerated.  You may have sexual intercourse when it is comfortable.  Refrain from any heavy lifting or straining until approved by your doctor. °a. You may drive when you are no longer taking prescription pain medication, you can comfortably wear a seatbelt, and you can safely maneuver your car and apply brakes. °b. RETURN TO WORK:  __________________________________________________________ °9. You should see your doctor in the office for a follow-up appointment approximately 2-3 weeks after your surgery.  Make sure that you call for this appointment within a day or two after you arrive home to insure a convenient appointment time. °10. OTHER INSTRUCTIONS: __________________________________________________________________________________________________________________________ __________________________________________________________________________________________________________________________ °WHEN TO CALL YOUR DOCTOR: °1. Fever over 101.0 °2. Inability to urinate °3. Continued bleeding from incision. °4. Increased pain, redness, or drainage from the incision. °5. Increasing abdominal pain ° °The clinic staff is available to answer your questions during regular business hours.  Please don’t hesitate to call and ask to speak to one of the nurses for clinical concerns.  If you have a medical emergency, go to the nearest emergency room or call 911.  A surgeon from [facility] is always on call at the hospital. °[address], [address], [place], [place]  [address] ? [address] [address], [place], [place]   [address] °[phone] ? [phone] ? FAX [phone] °Web site:   [email or web address] °

## 2012-07-31 NOTE — Progress Notes (Signed)
Patient states not getting relief from hydrocodone or morphine. Dr notified. New orders to come.

## 2012-07-31 NOTE — Progress Notes (Signed)
1 Day Post-Op  Subjective: Sore otherwise ok  Objective: Vital signs in last 24 hours: Temp:  [97 F (36.1 C)-98.6 F (37 C)] 98.6 F (37 C) (02/07 0552) Pulse Rate:  [51-79] 63  (02/07 0552) Resp:  [10-17] 17  (02/07 0552) BP: (93-132)/(55-83) 93/55 mmHg (02/07 0552) SpO2:  [94 %-100 %] 98 % (02/07 0552) Last BM Date: 07/29/12  Intake/Output from previous day: 02/06 0701 - 02/07 0700 In: 2702 [I.V.:2702] Out: 53 [Urine:3; Blood:50] Intake/Output this shift:    General appearance: no distress GI: soft, approp tender, wounds clean  Lab Results:   Basename 07/30/12 1503 07/30/12 0200  WBC 10.5 12.1*  HGB 14.0 14.9  HCT 37.6 40.3  PLT 154 188   BMET  Basename 07/30/12 1503 07/30/12 0200  NA -- 138  K -- 3.9  CL -- 104  CO2 -- 27  GLUCOSE -- 109*  BUN -- 11  CREATININE 0.58 0.74  CALCIUM -- 9.4   PT/INR No results found for this basename: LABPROT:2,INR:2 in the last 72 hours ABG No results found for this basename: PHART:2,PCO2:2,PO2:2,HCO3:2 in the last 72 hours  Studies/Results: Dg Cholangiogram Operative  07/30/2012  *RADIOLOGY REPORT*  Intraoperative cholangiogram  History:  Cholecystitis  Findings:  Gallbladder is been removed, and the cystic duct has been cannulated.  The intrahepatic and extrahepatic biliary ducts appear normal.  There is no mass or calculus seen.  No obstructing lesion.  There is apparent free flow of contrast via the common bile duct into the duodenum.  Conclusion:  No obstructing lesion identified.   Original Report Authenticated By: Bretta Bang, M.D.    US Abdomen Complete  07/30/2012  *RADIOLOGY REPORT*  Clinical Data:  Right upper quadrant pain.  COMPLETE ABDOMINAL ULTRASOUND  Comparison:  05/22/2012  Findings:  Gallbladder:  Multiple shadowing stones are present in the dependent portion of the gallbladder.  Biliary sludge.  No significant wall thickening or pericholecystic edema.  The overall appearance is similar to the previous  study.  The Murphy's sign is reportedly positive.  The presence of gallstones with positive Murphy's sign has a high correlation to acute cholecystitis.  Common bile duct:  Normal caliber with measured diameter of 4.9 mm.  Liver:  No focal lesion identified.  Within normal limits in parenchymal echogenicity.  IVC:  Appears normal.  Pancreas:  No focal abnormality seen.  Spleen:  Spleen length measures 8 cm.  Normal parenchymal echotexture.  Right Kidney:  Right kidney measures 9.9 cm length.  No hydronephrosis.  Left Kidney:  Left kidney measures 10.3 cm length.  No hydronephrosis.  Abdominal aorta:  No aneurysm identified.  IMPRESSION: Multiple stones and sludge in the gallbladder.  No gallbladder wall thickening or edema but Murphy's sign is positive.  Findings may correlate with acute cholecystitis in the appropriate clinical setting.   Original Report Authenticated By: Burman Nieves, M.D.    Assessment/Plan: POD 1 lap chole Plan dc if tol diet, up and around with pain controlled  [facility] 07/31/2012

## 2012-08-03 ENCOUNTER — Encounter (HOSPITAL_COMMUNITY): Payer: Self-pay | Admitting: General Surgery

## 2012-08-18 ENCOUNTER — Encounter (INDEPENDENT_AMBULATORY_CARE_PROVIDER_SITE_OTHER): Payer: Self-pay | Admitting: Internal Medicine

## 2012-08-18 ENCOUNTER — Other Ambulatory Visit (INDEPENDENT_AMBULATORY_CARE_PROVIDER_SITE_OTHER): Payer: Self-pay | Admitting: General Surgery

## 2012-08-18 ENCOUNTER — Ambulatory Visit (INDEPENDENT_AMBULATORY_CARE_PROVIDER_SITE_OTHER): Payer: Self-pay | Admitting: Internal Medicine

## 2012-08-18 VITALS — BP 124/80 | HR 93 | Temp 97.9°F | Resp 16 | Ht 64.0 in | Wt 148.4 lb

## 2012-08-18 DIAGNOSIS — Z9049 Acquired absence of other specified parts of digestive tract: Secondary | ICD-10-CM

## 2012-08-18 DIAGNOSIS — K8010 Calculus of gallbladder with chronic cholecystitis without obstruction: Secondary | ICD-10-CM

## 2012-08-18 LAB — COMPREHENSIVE METABOLIC PANEL
ALT: 11 U/L (ref 0–35)
AST: 13 U/L (ref 0–37)
Albumin: 4.5 g/dL (ref 3.5–5.2)
Alkaline Phosphatase: 73 U/L (ref 39–117)
BUN: 8 mg/dL (ref 6–23)
CO2: 26 mEq/L (ref 19–32)
Calcium: 9.8 mg/dL (ref 8.4–10.5)
Chloride: 106 mEq/L (ref 96–112)
Creat: 0.69 mg/dL (ref 0.50–1.10)
Glucose, Bld: 85 mg/dL (ref 70–99)
Potassium: 4.1 mEq/L (ref 3.5–5.3)
Sodium: 140 mEq/L (ref 135–145)
Total Bilirubin: 0.5 mg/dL (ref 0.3–1.2)
Total Protein: 7.3 g/dL (ref 6.0–8.3)

## 2012-08-18 MED ORDER — KETOROLAC TROMETHAMINE 10 MG PO TABS: 15.0000 mg | ORAL_TABLET | Freq: Four times a day (QID) | ORAL | Status: DC | PRN

## 2012-08-18 MED ORDER — KETOROLAC TROMETHAMINE 10 MG PO TABS: 10.0000 mg | ORAL_TABLET | Freq: Three times a day (TID) | ORAL | Status: DC

## 2012-08-18 NOTE — Progress Notes (Signed)
  Subjective: Pt returns to the clinic today after undergoing laparoscopic cholecystectomy on 07/30/12.  Pt c/o moderate to severe pain and persistent nausea.  She is still drinking well but is not eating much, her bowels are moving, she reports her toradol ran out and that it was helping her pain and she request being out of work for a total of 4-6 weeks due to her pain and "slow healing".  She denies vomiting, fever, chills.  Objective: Vital signs in last 24 hours: Reviewed  PE: Abd: soft, diffuse mild tenderness more so in RUQ and umbilicus, +bs, incisions well healed  Lab Results:  No results found for this basename: WBC, HGB, HCT, PLT,  in the last 72 hours BMET No results found for this basename: NA, K, CL, CO2, GLUCOSE, BUN, CREATININE, CALCIUM,  in the last 72 hours PT/INR No results found for this basename: LABPROT, INR,  in the last 72 hours CMP     Component Value Date/Time   NA 138 07/30/2012 0200   K 3.9 07/30/2012 0200   CL 104 07/30/2012 0200   CO2 27 07/30/2012 0200   GLUCOSE 109* 07/30/2012 0200   BUN 11 07/30/2012 0200   CREATININE 0.58 07/30/2012 1503   CALCIUM 9.4 07/30/2012 0200   PROT 7.2 07/30/2012 0200   ALBUMIN 4.0 07/30/2012 0200   AST 12 07/30/2012 0200   ALT 8 07/30/2012 0200   ALKPHOS 82 07/30/2012 0200   BILITOT 0.3 07/30/2012 0200   GFRNONAA >90 07/30/2012 1503   GFRAA >90 07/30/2012 1503   Lipase     Component Value Date/Time   LIPASE 32 07/30/2012 0200       Studies/Results: No results found.  Anti-infectives: Anti-infectives   None       Assessment/Plan  1.  S/P Laparoscopic Cholecystectomy: still having pain and some nausea post-op, is able to eat and drink some, does not appear septic or in acute distress, spoke with Dr. Derrell Lolling regarding patient having unexpected findings for a patient who is 2-3 weeks out from uncomplicated lap chole, he recommends sending patient for CMP and lipase, if abnormal patient will need CT or [place], patient is requesting to be out of  work for 2-3 weeks longer but I explained to her that will will get lab work first then can make a decision regarding work, she may need to follow up with the MD if she continues to have significant pain   WHITE, ELIZABETH 08/18/2012

## 2012-08-18 NOTE — Patient Instructions (Addendum)
  Go to lab to get CMP and lipase today. We will call with results.

## 2012-08-19 LAB — LIPASE: Lipase: 18 U/L (ref 0–75)

## 2012-12-13 ENCOUNTER — Encounter (HOSPITAL_COMMUNITY): Payer: Self-pay | Admitting: Adult Health

## 2012-12-13 ENCOUNTER — Emergency Department (HOSPITAL_COMMUNITY)
Admission: EM | Admit: 2012-12-13 | Discharge: 2012-12-13 | Discharge status: Against Medical Advice | Payer: Self-pay | Attending: Emergency Medicine | Admitting: Emergency Medicine

## 2012-12-13 DIAGNOSIS — J45909 Unspecified asthma, uncomplicated: Secondary | ICD-10-CM | POA: Insufficient documentation

## 2012-12-13 DIAGNOSIS — F17200 Nicotine dependence, unspecified, uncomplicated: Secondary | ICD-10-CM | POA: Insufficient documentation

## 2012-12-13 DIAGNOSIS — N644 Mastodynia: Secondary | ICD-10-CM | POA: Insufficient documentation

## 2012-12-13 DIAGNOSIS — R109 Unspecified abdominal pain: Secondary | ICD-10-CM | POA: Insufficient documentation

## 2012-12-13 LAB — POCT PREGNANCY, URINE: Preg Test, Ur: NEGATIVE

## 2012-12-13 NOTE — ED Notes (Addendum)
Pt reports that she has been feeling throbbing in her right fallopian tube that began Friday, breast tenderness, cramping in abdomen and different tastes of food. She reports having her left fallopian tube removed due to ectopic pregnancy, LMP 5/23, but it was very light.  She is concerned she is having another ectopic pregnancy.

## 2012-12-14 ENCOUNTER — Encounter (HOSPITAL_COMMUNITY): Payer: Self-pay | Admitting: Emergency Medicine

## 2012-12-14 ENCOUNTER — Emergency Department (HOSPITAL_COMMUNITY): Payer: Self-pay

## 2012-12-14 ENCOUNTER — Emergency Department (HOSPITAL_COMMUNITY)
Admission: EM | Admit: 2012-12-14 | Discharge: 2012-12-14 | Disposition: A | Discharge status: Home / Self Care | Payer: Self-pay | Attending: Emergency Medicine | Admitting: Emergency Medicine

## 2012-12-14 DIAGNOSIS — Z87440 Personal history of urinary (tract) infections: Secondary | ICD-10-CM | POA: Insufficient documentation

## 2012-12-14 DIAGNOSIS — J45909 Unspecified asthma, uncomplicated: Secondary | ICD-10-CM | POA: Insufficient documentation

## 2012-12-14 DIAGNOSIS — Z3202 Encounter for pregnancy test, result negative: Secondary | ICD-10-CM | POA: Insufficient documentation

## 2012-12-14 DIAGNOSIS — Z79899 Other long term (current) drug therapy: Secondary | ICD-10-CM | POA: Insufficient documentation

## 2012-12-14 DIAGNOSIS — N83209 Unspecified ovarian cyst, unspecified side: Secondary | ICD-10-CM | POA: Insufficient documentation

## 2012-12-14 DIAGNOSIS — D573 Sickle-cell trait: Secondary | ICD-10-CM | POA: Insufficient documentation

## 2012-12-14 DIAGNOSIS — F17200 Nicotine dependence, unspecified, uncomplicated: Secondary | ICD-10-CM | POA: Insufficient documentation

## 2012-12-14 DIAGNOSIS — Z8679 Personal history of other diseases of the circulatory system: Secondary | ICD-10-CM | POA: Insufficient documentation

## 2012-12-14 DIAGNOSIS — Z8619 Personal history of other infectious and parasitic diseases: Secondary | ICD-10-CM | POA: Insufficient documentation

## 2012-12-14 DIAGNOSIS — Z8719 Personal history of other diseases of the digestive system: Secondary | ICD-10-CM | POA: Insufficient documentation

## 2012-12-14 DIAGNOSIS — Z8709 Personal history of other diseases of the respiratory system: Secondary | ICD-10-CM

## 2012-12-14 LAB — COMPREHENSIVE METABOLIC PANEL
ALT: 9 U/L (ref 0–35)
AST: 16 U/L (ref 0–37)
Albumin: 4.2 g/dL (ref 3.5–5.2)
Alkaline Phosphatase: 83 U/L (ref 39–117)
Chloride: 102 mEq/L (ref 96–112)
Potassium: 3.8 mEq/L (ref 3.5–5.1)
Sodium: 138 mEq/L (ref 135–145)
Total Bilirubin: 0.6 mg/dL (ref 0.3–1.2)
Total Protein: 7.4 g/dL (ref 6.0–8.3)

## 2012-12-14 LAB — URINALYSIS, ROUTINE W REFLEX MICROSCOPIC
Bilirubin Urine: NEGATIVE
Glucose, UA: NEGATIVE mg/dL
Hgb urine dipstick: NEGATIVE
Ketones, ur: NEGATIVE mg/dL
Leukocytes, UA: NEGATIVE
pH: 7 (ref 5.0–8.0)

## 2012-12-14 LAB — CBC WITH DIFFERENTIAL/PLATELET
Basophils Relative: 0 % (ref 0–1)
Eosinophils Absolute: 0.1 10*3/uL (ref 0.0–0.7)
Hemoglobin: 15.3 g/dL — ABNORMAL HIGH (ref 12.0–15.0)
Lymphs Abs: 3.5 10*3/uL (ref 0.7–4.0)
MCH: 30.6 pg (ref 26.0–34.0)
MCHC: 36.9 g/dL — ABNORMAL HIGH (ref 30.0–36.0)
Neutro Abs: 3.6 10*3/uL (ref 1.7–7.7)
Neutrophils Relative %: 47 % (ref 43–77)
Platelets: 184 10*3/uL (ref 150–400)
RBC: 5 MIL/uL (ref 3.87–5.11)

## 2012-12-14 LAB — WET PREP, GENITAL: Yeast Wet Prep HPF POC: NONE SEEN

## 2012-12-14 MED ORDER — ACETAMINOPHEN 325 MG PO TABS: 650.0000 mg | ORAL_TABLET | Freq: Four times a day (QID) | ORAL | Status: DC | PRN

## 2012-12-14 MED ORDER — ACETAMINOPHEN 500 MG PO TABS
500.0000 mg | ORAL_TABLET | Freq: Once | ORAL | Status: AC
  Administered 2012-12-14: 500 mg via ORAL
  Filled 2012-12-14: qty 1

## 2012-12-14 NOTE — ED Notes (Signed)
Pelvic cart at bedside. 

## 2012-12-14 NOTE — ED Notes (Signed)
Pt sts right sided ovarian pain x 3 days; pt seen last night for same but did not stay; pt sts hx of ectopic pregnancy on left and feels same; pt sts LMP was 5/23; pt G6 P2 M3

## 2012-12-14 NOTE — ED Provider Notes (Signed)
History     CSN: [id number]  Arrival date & time 12/14/12  1041   First MD Initiated Contact with Patient 12/14/12 1232      Chief Complaint  Patient presents with  . Abdominal Pain    (Consider location/radiation/quality/duration/timing/severity/associated sxs/prior treatment) The history is provided by the patient. No language interpreter was used.  [name] is a 27 y/o F, [id number], with PMHx of asthma, gestational DM, chlamydia and trich infection, ectopic pregnancy surgery in 2011 presenting to the ED with right pelvic pain x 2 days - described as an intermittent sharp shooting pain with radiation to the abdomen that last approximately 1 minute. Stated that nothing makes the pain better or worse, stated that she has been using nothing for the discomfort. Reported that her LMP was May 23. 2014 - stated that her menstrual cycle is regular, lasting 7 days. Stated that she is sexually active - last encounter was Tuesday/Wednesday of last week - denied using protection. Stated that she has been having vaginal discharge x 2 days - described as being odorless, clear discharge - denied having before. Stated that she thinks she is pregnant because her breast are being tender and mildly swollen and having headaches - stated that when she gets her period this happens but she gets mild numbness to the legs and abdominal discomfort across the lower abdomen. Stated that she has been feeling bloated x 1 week and tired. Stated that she has been feeling nausea. Denied fever, chills, diarrhea, vomiting, back pain, dysuria, hematuria, chest pain, shortness of breath, difficulty breathing. Denied history of ovarian cysts, fibroids.  PCP none   Past Medical History  Diagnosis Date  . Asthma   . Umbilical hernia   . Gestational diabetes   . Pregnancy induced hypertension     post- partem 2012  . Sickle cell trait   . Urinary tract infection   . Headache(784.0)   . Chlamydia   . Trichomonas      Past Surgical History  Procedure Laterality Date  . Exploratory laparotomy      removal of ectopic pregnancy  . Ectopic pregnancy surgery    . Cholecystectomy  07/30/2012    Laproscopic     . Cholecystectomy N/A 07/30/2012    Procedure: LAPAROSCOPIC CHOLECYSTECTOMY WITH INTRAOPERATIVE CHOLANGIOGRAM;  Surgeon: Ernestene Mention, MD;  Location: [facility] OR;  Service: General;  Laterality: N/A;    Family History  Problem Relation Age of Onset  . Asthma Maternal Grandmother   . Diabetes Maternal Grandmother   . Anesthesia problems Neg Hx     History  Substance Use Topics  . Smoking status: Current Some Day Smoker -- 0.25 packs/day for 10 years    Types: Cigarettes  . Smokeless tobacco: Never Used  . Alcohol Use: No    OB History   Grav Para Term Preterm Abortions TAB SAB Ect Mult Living   5 2 2  0 2 0 1 1 0 2      Review of Systems  Constitutional: Negative for fever and chills.  HENT: Negative for trouble swallowing.   Eyes: Negative for photophobia, pain and visual disturbance.  Respiratory: Negative for cough, chest tightness and shortness of breath.   Cardiovascular: Negative for chest pain.  Gastrointestinal: Positive for nausea and abdominal pain. Negative for vomiting and diarrhea.  Genitourinary: Positive for vaginal discharge and pelvic pain (right sided pelvic pain). Negative for hematuria, flank pain, decreased urine volume, vaginal bleeding and vaginal pain.  Musculoskeletal: Negative for back  pain.  Neurological: Positive for headaches. Negative for weakness and numbness.  All other systems reviewed and are negative.    Allergies  Lactose intolerance (gi)  Home Medications   Current Outpatient Rx  Name  Route  Sig  Dispense  Refill  . acetaminophen (TYLENOL) 500 MG tablet   Oral   Take 1,000 mg by mouth 4 (four) times daily as needed for pain.         Marland Kitchen albuterol (PROVENTIL HFA;VENTOLIN HFA) 108 (90 BASE) MCG/ACT inhaler   Inhalation   Inhale 2 puffs  into the lungs 4 (four) times daily as needed (asthma).         Marland Kitchen acetaminophen (TYLENOL) 325 MG tablet   Oral   Take 2 tablets (650 mg total) by mouth every 6 (six) hours as needed for pain.   30 tablet   0     BP 120/67  Pulse 85  Temp(Src) 98.2 F (36.8 C) (Oral)  Resp 18  SpO2 97%  Physical Exam  Nursing note and vitals reviewed. Constitutional: She is oriented to person, place, and time. She appears well-developed and well-nourished. No distress.  HENT:  Head: Normocephalic and atraumatic.  Eyes: Conjunctivae and EOM are normal. Pupils are equal, round, and reactive to light. Right eye exhibits no discharge. Left eye exhibits no discharge.  Neck: Normal range of motion. Neck supple. No tracheal deviation present.  Cardiovascular: Normal rate, regular rhythm and normal heart sounds.  Exam reveals no friction rub.   No murmur heard. Pulses:      Radial pulses are 2+ on the right side, and 2+ on the left side.       Dorsalis pedis pulses are 2+ on the right side, and 2+ on the left side.  Pulmonary/Chest: Effort normal and breath sounds normal. No respiratory distress. She has no wheezes. She has no rales.  Abdominal: Soft. Bowel sounds are normal. She exhibits no distension. There is no hepatosplenomegaly. There is tenderness. There is no rigidity, no rebound and no guarding. No hernia.    Discomfort upon palpation to the right pelvic region - mild Negative psoas  Negative obtruator  Cholecystectomy performed 07/2012  Genitourinary: Vaginal discharge found.  Speculum: Negative sores, lesion, inflammation, swelling, erythema noted to external genitalia. Negative inflammation, swelling, lesions, sores, erythema noted to the vaginal canal. Negative strawberry cervix. Negative inflammation, swelling, erythema, sores, lesions to the cervix. Positive thick white discharge - yeast appearing. Negative odor. Negative pain upon palpation to the cervix. Negative adnexal tenderness,  bilaterally.   Lymphadenopathy:    She has no cervical adenopathy.  Neurological: She is alert and oriented to person, place, and time. No cranial nerve deficit. She exhibits normal muscle tone. Coordination normal. GCS eye subscore is 4. GCS verbal subscore is 5. GCS motor subscore is 6.  Skin: Skin is warm and dry. No rash noted. She is not diaphoretic. No erythema.  Psychiatric: She has a normal mood and affect. Her behavior is normal. Thought content normal.    ED Course  Procedures (including critical care time)  Medications  acetaminophen (TYLENOL) tablet 500 mg (500 mg Oral Given 12/14/12 1336)    Labs Reviewed  WET PREP, GENITAL - Abnormal; Notable for the following:    Clue Cells Wet Prep HPF POC FEW (*)    WBC, Wet Prep HPF POC FEW (*)    All other components within normal limits  CBC WITH DIFFERENTIAL - Abnormal; Notable for the following:    Hemoglobin 15.3 (*)  MCHC 36.9 (*)    All other components within normal limits  URINALYSIS, ROUTINE W REFLEX MICROSCOPIC - Abnormal; Notable for the following:    APPearance HAZY (*)    All other components within normal limits  GC/CHLAMYDIA PROBE AMP  COMPREHENSIVE METABOLIC PANEL  HCG, QUANTITATIVE, PREGNANCY  POCT PREGNANCY, URINE   US Transvaginal Non-ob  12/14/2012   *RADIOLOGY REPORT*  Clinical Data: Pelvic pain  TRANSABDOMINAL AND TRANSVAGINAL ULTRASOUND OF PELVIS Technique:  Both transabdominal and transvaginal ultrasound examinations of the pelvis were performed. Transabdominal technique was performed for global imaging of the pelvis including uterus, ovaries, adnexal regions, and pelvic cul-de-sac.  It was necessary to proceed with endovaginal exam following the transabdominal exam to visualize the ovaries more accurately.  Comparison:  07/30/2012.  05/17/2011.  Findings:  Uterus: Normal except for retroversion.  8.2 x 4.8 x 5.2 cm.  No focal lesions.  Endometrium: Normal measuring 5 mm in thickness.  Right ovary:  4.2 x  2.9 x 2.6 cm.  The ovary contains a 1.4 x 1.4 x 2.0 cm somewhat irregular fluid area with crenulated margins that could indicate a resolving hemorrhagic cyst.  Left ovary: 4.6 x 1.4 x 2.4 cm.  Normal appearing follicular cysts.  Both ovaries show normal color flow.  Other findings: Tiny amount of free fluid on the right.  IMPRESSION: Retroverted uterus.  Tiny amount of free fluid.  1.4 x 1.4 x 2.0 cm irregular fluid area in the right ovary that could represent a resolving hemorrhagic cyst.  There is no information regarding beta HCG.  If the patient is pregnant, this could represent an ectopic pregnancy.  If the pain is right-sided (and the patient is not pregnant) follow- up in 4-6 weeks, after  the next menses could show resolution of this abnormality.   Original Report Authenticated By: Paulina Fusi, M.D.   US Pelvis Complete  12/14/2012   *RADIOLOGY REPORT*  Clinical Data: Pelvic pain  TRANSABDOMINAL AND TRANSVAGINAL ULTRASOUND OF PELVIS Technique:  Both transabdominal and transvaginal ultrasound examinations of the pelvis were performed. Transabdominal technique was performed for global imaging of the pelvis including uterus, ovaries, adnexal regions, and pelvic cul-de-sac.  It was necessary to proceed with endovaginal exam following the transabdominal exam to visualize the ovaries more accurately.  Comparison:  07/30/2012.  05/17/2011.  Findings:  Uterus: Normal except for retroversion.  8.2 x 4.8 x 5.2 cm.  No focal lesions.  Endometrium: Normal measuring 5 mm in thickness.  Right ovary:  4.2 x 2.9 x 2.6 cm.  The ovary contains a 1.4 x 1.4 x 2.0 cm somewhat irregular fluid area with crenulated margins that could indicate a resolving hemorrhagic cyst.  Left ovary: 4.6 x 1.4 x 2.4 cm.  Normal appearing follicular cysts.  Both ovaries show normal color flow.  Other findings: Tiny amount of free fluid on the right.  IMPRESSION: Retroverted uterus.  Tiny amount of free fluid.  1.4 x 1.4 x 2.0 cm irregular fluid  area in the right ovary that could represent a resolving hemorrhagic cyst.  There is no information regarding beta HCG.  If the patient is pregnant, this could represent an ectopic pregnancy.  If the pain is right-sided (and the patient is not pregnant) follow- up in 4-6 weeks, after  the next menses could show resolution of this abnormality.   Original Report Authenticated By: Paulina Fusi, M.D.     1. Hemorrhagic ovarian cyst   2. History of asthma       MDM  Patient presenting with right pelvic pain described as a sharp, shooting pain that has been ongoing for the past 2 days with associated nausea.  Pregnancy negative, beta hCG < 1 - doubt patient being pregnant. CMP and CBC negative findings. Urine negative infection noted. Negative bacterial infection noted to the Wet Prep - negative Trich. Pelvic ultrasound -  irregular fluid area in right ovary with suspicion to be resolving hemorrhagic cyst. Doubt to be ectopic pregnancy since patient is not pregnant. Suspicion for right sided pelvic pain due to hemorrhagic cyst. Patient stable, afebrile. Discussed case and findings with Dr. Leeann Must - cleared patient for discharge. Patient discharged with anti-inflammatories. Referred patient to [facility] for further follow-up by the end of this week and repeat US in 4 weeks. Discussed with patient to continue to monitor symptoms and if symptoms are to worsen or change to report back to the ED - strict return instructions given.  Patient agreed to plan of care, understood, all questions answered.             Raymon Mutton, PA-C 12/14/12 2119

## 2012-12-16 NOTE — ED Provider Notes (Signed)
Medical screening examination/treatment/procedure(s) were performed by non-physician practitioner and as supervising physician I was immediately available for consultation/collaboration.   Suzi Roots, MD 12/16/12 [phone]

## 2013-06-06 ENCOUNTER — Encounter (HOSPITAL_COMMUNITY): Payer: Self-pay | Admitting: Emergency Medicine

## 2013-06-06 ENCOUNTER — Emergency Department (INDEPENDENT_AMBULATORY_CARE_PROVIDER_SITE_OTHER)
Admission: EM | Admit: 2013-06-06 | Discharge: 2013-06-06 | Disposition: A | Discharge status: Home / Self Care | Payer: Self-pay | Source: Home / Self Care | Attending: Emergency Medicine | Admitting: Emergency Medicine

## 2013-06-06 DIAGNOSIS — J069 Acute upper respiratory infection, unspecified: Secondary | ICD-10-CM

## 2013-06-06 DIAGNOSIS — K649 Unspecified hemorrhoids: Secondary | ICD-10-CM

## 2013-06-06 NOTE — ED Provider Notes (Signed)
Medical screening examination/treatment/procedure(s) were performed by non-physician practitioner and as supervising physician I was immediately available for consultation/collaboration.  Hale Chalfin, M.D.  Mattis Featherly C Fynn Vanblarcom, MD 06/06/13 2053 

## 2013-06-06 NOTE — ED Provider Notes (Signed)
CSN: [id number]     Arrival date & time 06/06/13  1222 History   First MD Initiated Contact with Patient 06/06/13 1453     Chief Complaint  Patient presents with  . Cough    Started Tuesday.  Flu like symptoms, coughing, sore throat   (Consider location/radiation/quality/duration/timing/severity/associated sxs/prior Treatment) HPI Comments: Pt also c/o rectal bleeding 2 days ago. One episode, has never happened before. Was bright red blood on toilet paper after bowel movement  Patient is a 27 y.o. female presenting with URI. The history is provided by the patient.  URI Presenting symptoms: congestion, cough, ear pain, rhinorrhea and sore throat   Presenting symptoms: no fever   Severity:  Moderate Onset quality:  Gradual Duration:  5 days Timing:  Constant Progression:  Unchanged Chronicity:  New Relieved by:  Nothing Worsened by:  Nothing tried Ineffective treatments:  OTC medications (mucinex) Associated symptoms: no sinus pain and no swollen glands     Past Medical History  Diagnosis Date  . Asthma   . Umbilical hernia   . Gestational diabetes   . Pregnancy induced hypertension     post- partem 2012  . Sickle cell trait   . Urinary tract infection   . Headache(784.0)   . Chlamydia   . Trichomonas    Past Surgical History  Procedure Laterality Date  . Exploratory laparotomy      removal of ectopic pregnancy  . Ectopic pregnancy surgery    . Cholecystectomy  07/30/2012    Laproscopic     . Cholecystectomy N/A 07/30/2012    Procedure: LAPAROSCOPIC CHOLECYSTECTOMY WITH INTRAOPERATIVE CHOLANGIOGRAM;  Surgeon: Ernestene Mention, MD;  Location: [facility] OR;  Service: General;  Laterality: N/A;   Family History  Problem Relation Age of Onset  . Asthma Maternal Grandmother   . Diabetes Maternal Grandmother   . Anesthesia problems Neg Hx    History  Substance Use Topics  . Smoking status: Current Some Day Smoker -- 0.25 packs/day for 10 years    Types: Cigarettes  .  Smokeless tobacco: Never Used  . Alcohol Use: No   OB History   Grav Para Term Preterm Abortions TAB SAB Ect Mult Living   5 2 2  0 2 0 1 1 0 2     Review of Systems  Constitutional: Positive for chills. Negative for fever.  HENT: Positive for congestion, ear pain, rhinorrhea and sore throat. Negative for sinus pressure.   Respiratory: Positive for cough.   Gastrointestinal: Positive for anal bleeding. Negative for constipation.    Allergies  Lactose intolerance (gi)  Home Medications   Current Outpatient Rx  Name  Route  Sig  Dispense  Refill  . albuterol (PROVENTIL HFA;VENTOLIN HFA) 108 (90 BASE) MCG/ACT inhaler   Inhalation   Inhale 2 puffs into the lungs 4 (four) times daily as needed (asthma).         Marland Kitchen acetaminophen (TYLENOL) 325 MG tablet   Oral   Take 2 tablets (650 mg total) by mouth every 6 (six) hours as needed for pain.   30 tablet   0   . acetaminophen (TYLENOL) 500 MG tablet   Oral   Take 1,000 mg by mouth 4 (four) times daily as needed for pain.          BP 145/102  Pulse 85  Temp(Src) 98.1 F (36.7 C) (Oral)  Resp 20  SpO2 96%  LMP 05/13/2013 Physical Exam  Constitutional: She appears well-developed and well-nourished. No distress.  HENT:  Right Ear: Hearing, tympanic membrane and ear canal normal.  Left Ear: Hearing, tympanic membrane and ear canal normal.  Nose: Mucosal edema and rhinorrhea present. Right sinus exhibits no maxillary sinus tenderness and no frontal sinus tenderness. Left sinus exhibits no maxillary sinus tenderness and no frontal sinus tenderness.  Mouth/Throat: Oropharynx is clear and moist and mucous membranes are normal.  Cardiovascular: Normal rate and regular rhythm.   Pulmonary/Chest: Effort normal and breath sounds normal.  Genitourinary: Rectal exam shows external hemorrhoid. Rectal exam shows no internal hemorrhoid, no fissure, no tenderness and anal tone normal. Guaiac negative stool.  Lymphadenopathy:       Head  (right side): No submental, no submandibular and no tonsillar adenopathy present.       Head (left side): No submental, no submandibular and no tonsillar adenopathy present.    ED Course  Procedures (including critical care time) Labs Review Labs Reviewed - No data to display Imaging Review No results found.  EKG Interpretation    Date/Time:    Ventricular Rate:    PR Interval:    QRS Duration:   QT Interval:    QTC Calculation:   R Axis:     Text Interpretation:              MDM   1. URI (upper respiratory infection)   2. Hemorrhoid   recommended saline nasal spray, cold medicine with decongestant.      Cathlyn Parsons, NP 06/06/13 1500

## 2013-06-06 NOTE — ED Notes (Signed)
Has been taking liquid Muccinex and pills.  Has not helped, except for helping throat.  Denies any fevers, however has been feeling hot

## 2014-04-25 ENCOUNTER — Encounter (HOSPITAL_COMMUNITY): Payer: Self-pay | Admitting: Emergency Medicine

## 2015-08-26 ENCOUNTER — Emergency Department (INDEPENDENT_AMBULATORY_CARE_PROVIDER_SITE_OTHER)
Admission: EM | Admit: 2015-08-26 | Discharge: 2015-08-26 | Disposition: A | Discharge status: Home / Self Care | Payer: Self-pay | Source: Home / Self Care | Attending: Emergency Medicine | Admitting: Emergency Medicine

## 2015-08-26 ENCOUNTER — Encounter (HOSPITAL_COMMUNITY): Payer: Self-pay | Admitting: Emergency Medicine

## 2015-08-26 DIAGNOSIS — R0989 Other specified symptoms and signs involving the circulatory and respiratory systems: Secondary | ICD-10-CM

## 2015-08-26 DIAGNOSIS — F458 Other somatoform disorders: Secondary | ICD-10-CM

## 2015-08-26 DIAGNOSIS — K219 Gastro-esophageal reflux disease without esophagitis: Secondary | ICD-10-CM

## 2015-08-26 MED ORDER — OMEPRAZOLE 2 MG/ML ORAL SUSPENSION: 40.0000 mg | Freq: Every day | ORAL | Status: DC

## 2015-08-26 NOTE — ED Notes (Signed)
Patient says she took a pill last night that she thinks is stuck or was.  She feels something different when she drinks liquids

## 2015-08-26 NOTE — ED Provider Notes (Signed)
CSN: [id number]     Arrival date & time 08/26/15  1403 History   First MD Initiated Contact with Patient 08/26/15 1518     Chief Complaint  Patient presents with  . Swallowed Foreign Body   (Consider location/radiation/quality/duration/timing/severity/associated sxs/prior Treatment) HPI She is a 30 year old woman here for evaluation of throat discomfort. She states that last night she took 2 extra strength Tylenol for a migraine headache. She states she felt like the pills got lodged in her throat. She's been able to swallow foods and liquids, but it causes discomfort. She reports a burning sensation with swallowing, worse after solids than liquids. The pain lasts a few minutes and then self resolves. She has been able to get down both solids and liquids. She also reports feeling like the back of her throat and glands are swollen. She reports a head cold 2 weeks ago that just improved in the last few days. No shortness of breath or trouble breathing.  Past Medical History  Diagnosis Date  . Asthma   . Umbilical hernia   . Gestational diabetes   . Pregnancy induced hypertension     post- partem 2012  . Sickle cell trait (HCC)   . Urinary tract infection   . Headache(784.0)   . Chlamydia   . Trichomonas    Past Surgical History  Procedure Laterality Date  . Exploratory laparotomy      removal of ectopic pregnancy  . Ectopic pregnancy surgery    . Cholecystectomy  07/30/2012    Laproscopic     . Cholecystectomy N/A 07/30/2012    Procedure: LAPAROSCOPIC CHOLECYSTECTOMY WITH INTRAOPERATIVE CHOLANGIOGRAM;  Surgeon: Ernestene Mention, MD;  Location: [facility] OR;  Service: General;  Laterality: N/A;   Family History  Problem Relation Age of Onset  . Asthma Maternal Grandmother   . Diabetes Maternal Grandmother   . Anesthesia problems Neg Hx    Social History  Substance Use Topics  . Smoking status: Current Some Day Smoker -- 0.25 packs/day for 10 years    Types: Cigarettes  . Smokeless  tobacco: Never Used  . Alcohol Use: No   OB History    Gravida Para Term Preterm AB TAB SAB Ectopic Multiple Living   0 2 0 1 1 0 2     Review of Systems As in history of present illness Allergies  Lactose intolerance (gi)  Home Medications   Prior to Admission medications   Medication Sig Start Date End Date Taking? Authorizing Provider  acetaminophen (TYLENOL) 325 MG tablet Take 2 tablets (650 mg total) by mouth every 6 (six) hours as needed for pain. 12/14/12   Marissa Sciacca, PA-C  acetaminophen (TYLENOL) 500 MG tablet Take 1,000 mg by mouth 4 (four) times daily as needed for pain.    Historical Provider, MD  albuterol (PROVENTIL HFA;VENTOLIN HFA) 108 (90 BASE) MCG/ACT inhaler Inhale 2 puffs into the lungs 4 (four) times daily as needed (asthma).    Historical Provider, MD  omeprazole (PRILOSEC) 2 mg/mL SUSP Take 20 mLs (40 mg total) by mouth daily. 08/26/15   Charm Rings, MD   Meds Ordered and Administered this Visit  Medications - No data to display  BP 119/77 mmHg  Pulse 84  Temp(Src) 98.2 F (36.8 C) (Oral)  Resp 18  SpO2 97% No data found.   Physical Exam  Constitutional: She is oriented to person, place, and time. She appears well-developed and well-nourished. No distress.  HENT:  Mouth/Throat: Oropharynx is clear  and moist. No oropharyngeal exudate.  Neck: Neck supple.  Tonsils are slightly swollen. She does have some cobblestoning posteriorly. No foreign bodies.  Cardiovascular: Normal rate, regular rhythm and normal heart sounds.   No murmur heard. Pulmonary/Chest: Effort normal and breath sounds normal. No respiratory distress. She has no wheezes. She has no rales.  Lymphadenopathy:    She has cervical adenopathy (submandibular).  Neurological: She is alert and oriented to person, place, and time.    ED Course  Procedures (including critical care time)  Labs Review Labs Reviewed - No data to display  Imaging Review No results found.   MDM    1. Gastroesophageal reflux disease, esophagitis presence not specified   2. Globus sensation    I suspect she has swallowed the pills at this time. She likely has some bruising or possibly a small tear in the esophagus at the site of prior lodging of the pills. Treat symptomatically with omeprazole for 2 weeks. Liquid medicine sent in at patient's request. If not improving over the next several days, she will follow-up with the GI doctor.    Charm RingsErin J Alyxander Kollmann, MD 08/26/15 [phone]

## 2015-08-26 NOTE — Discharge Instructions (Signed)
I suspect the pills got temporarily stuck last night. They appear to have become dislodged. This causes bruising and inflammation of the esophagus which can lead to the burning sensation and pain with swallowing. You also have some swelling of your lymph nodes and adenoid tissue in your neck. This is likely a result of your recent head cold. Gargle with salt water several times a day. Take the omeprazole daily for the next 2 weeks. If your pain is not improving over the next 3-4 days, please make an appointment to see the GI doctor.

## 2015-08-26 NOTE — ED Notes (Signed)
Patient was placed in the triage area by registration staff. The patient's vitals were assessed and as recorded. The patient complained of a possible pill that was stuck in her throat. The patient stated that she took the pill last night. The patient was in no apparent respiratory distress and airway was patent and secure. The patient complained of a a sharp chest pain that is related to the pill and description was reflux in nature. Patient was placed back in the waiting area.

## 2016-01-03 ENCOUNTER — Other Ambulatory Visit: Payer: Self-pay | Admitting: Advanced Practice Midwife

## 2016-01-03 ENCOUNTER — Inpatient Hospital Stay (HOSPITAL_COMMUNITY)
Admission: AD | Admit: 2016-01-03 | Discharge: 2016-01-03 | Disposition: A | Discharge status: Home / Self Care | Payer: Medicaid Other | Source: Ambulatory Visit | Attending: Obstetrics & Gynecology | Admitting: Obstetrics & Gynecology

## 2016-01-03 ENCOUNTER — Inpatient Hospital Stay (HOSPITAL_COMMUNITY): Payer: Medicaid Other

## 2016-01-03 ENCOUNTER — Encounter (HOSPITAL_COMMUNITY): Payer: Self-pay | Admitting: *Deleted

## 2016-01-03 DIAGNOSIS — Z3491 Encounter for supervision of normal pregnancy, unspecified, first trimester: Secondary | ICD-10-CM

## 2016-01-03 DIAGNOSIS — E739 Lactose intolerance, unspecified: Secondary | ICD-10-CM | POA: Diagnosis not present

## 2016-01-03 DIAGNOSIS — J45909 Unspecified asthma, uncomplicated: Secondary | ICD-10-CM | POA: Diagnosis not present

## 2016-01-03 DIAGNOSIS — Z87891 Personal history of nicotine dependence: Secondary | ICD-10-CM | POA: Diagnosis not present

## 2016-01-03 DIAGNOSIS — O99511 Diseases of the respiratory system complicating pregnancy, first trimester: Secondary | ICD-10-CM | POA: Diagnosis not present

## 2016-01-03 DIAGNOSIS — R102 Pelvic and perineal pain: Secondary | ICD-10-CM

## 2016-01-03 DIAGNOSIS — R1031 Right lower quadrant pain: Secondary | ICD-10-CM | POA: Diagnosis present

## 2016-01-03 DIAGNOSIS — O26891 Other specified pregnancy related conditions, first trimester: Secondary | ICD-10-CM | POA: Insufficient documentation

## 2016-01-03 DIAGNOSIS — Z3A01 Less than 8 weeks gestation of pregnancy: Secondary | ICD-10-CM | POA: Diagnosis not present

## 2016-01-03 DIAGNOSIS — D573 Sickle-cell trait: Secondary | ICD-10-CM | POA: Insufficient documentation

## 2016-01-03 LAB — WET PREP, GENITAL
Sperm: NONE SEEN
TRICH WET PREP: NONE SEEN
Yeast Wet Prep HPF POC: NONE SEEN

## 2016-01-03 LAB — POCT PREGNANCY, URINE: Preg Test, Ur: POSITIVE — AB

## 2016-01-03 LAB — CBC
HCT: 37.5 % (ref 36.0–46.0)
Hemoglobin: 14.2 g/dL (ref 12.0–15.0)
MCH: 30.9 pg (ref 26.0–34.0)
MCHC: 37.9 g/dL — AB (ref 30.0–36.0)
MCV: 81.7 fL (ref 78.0–100.0)
PLATELETS: 217 10*3/uL (ref 150–400)
RBC: 4.59 MIL/uL (ref 3.87–5.11)
RDW: 13.4 % (ref 11.5–15.5)
WBC: 11.1 10*3/uL — ABNORMAL HIGH (ref 4.0–10.5)

## 2016-01-03 LAB — URINALYSIS, ROUTINE W REFLEX MICROSCOPIC
BILIRUBIN URINE: NEGATIVE
GLUCOSE, UA: NEGATIVE mg/dL
HGB URINE DIPSTICK: NEGATIVE
KETONES UR: NEGATIVE mg/dL
Nitrite: NEGATIVE
PH: 6 (ref 5.0–8.0)
PROTEIN: NEGATIVE mg/dL
Specific Gravity, Urine: 1.025 (ref 1.005–1.030)

## 2016-01-03 LAB — URINE MICROSCOPIC-ADD ON: RBC / HPF: NONE SEEN RBC/hpf (ref 0–5)

## 2016-01-03 LAB — HCG, QUANTITATIVE, PREGNANCY: HCG, BETA CHAIN, QUANT, S: 56001 m[IU]/mL — AB (ref <5)

## 2016-01-03 MED ORDER — METRONIDAZOLE 0.75 % VA GEL: 1.0000 | Freq: Every day | VAGINAL | Status: DC

## 2016-01-03 NOTE — Addendum Note (Signed)
Addended by: Sharen CounterLEFTWICH-KIRBY, LISA A on: 01/03/2016 05:40 PM   Modules accepted: Orders

## 2016-01-03 NOTE — Progress Notes (Signed)
Attempt to print Rx for Metrogel for pt to pick [facility] and [facility].  Unable to print b/c pt no longer present in [facility].  Written Rx left at [facility]U front desk for pt to pick up.  Pt aware and will pick up today or tomorrow.

## 2016-01-03 NOTE — Discharge Instructions (Signed)
Abdominal Pain During Pregnancy Abdominal pain is common in pregnancy. Most of the time, it does not cause harm. There are many causes of abdominal pain. Some causes are more serious than others. Some of the causes of abdominal pain in pregnancy are easily diagnosed. Occasionally, the diagnosis takes time to understand. Other times, the cause is not determined. Abdominal pain can be a sign that something is very wrong with the pregnancy, or the pain may have nothing to do with the pregnancy at all. For this reason, always tell your health care provider if you have any abdominal discomfort. HOME CARE INSTRUCTIONS  Monitor your abdominal pain for any changes. The following actions may help to alleviate any discomfort you are experiencing:  Do not have sexual intercourse or put anything in your vagina until your symptoms go away completely.  Get plenty of rest until your pain improves.  Drink clear fluids if you feel nauseous. Avoid solid food as long as you are uncomfortable or nauseous.  Only take over-the-counter or prescription medicine as directed by your health care provider.  Keep all follow-up appointments with your health care provider. SEEK IMMEDIATE MEDICAL CARE IF:  You are bleeding, leaking fluid, or passing tissue from the vagina.  You have increasing pain or cramping.  You have persistent vomiting.  You have painful or bloody urination.  You have a fever.  You notice a decrease in your baby's movements.  You have extreme weakness or feel faint.  You have shortness of breath, with or without abdominal pain.  You develop a severe headache with abdominal pain.  You have abnormal vaginal discharge with abdominal pain.  You have persistent diarrhea.  You have abdominal pain that continues even after rest, or gets worse. MAKE SURE YOU:   Understand these instructions.  Will watch your condition.  Will get help right away if you are not doing well or get worse.     This information is not intended to replace advice given to you by your health care provider. Make sure you discuss any questions you have with your health care provider.   Document Released: 06/10/2005 Document Revised: 03/31/2013 Document Reviewed: 01/07/2013 Elsevier Interactive Patient Education 2016 Else[employer] Inc[employer] Musculoskeletal Pain Musculoskeletal pain is muscle and boney aches and pains. These pains can occur in any part of the body. Your caregiver may treat you without knowing the cause of the pain. They may treat you if blood or urine tests, X-rays, and other tests were normal.  CAUSES There is often not a definite cause or reason for these pains. These pains may be caused by a type of germ (virus). The discomfort may also come from overuse. Overuse includes working out too hard when your body is not fit. Boney aches also come from weather changes. Bone is sensitive to atmospheric pressure changes. HOME CARE INSTRUCTIONS   Ask when your test results will be ready. Make sure you get your test results.  Only take over-the-counter or prescription medicines for pain, discomfort, or fever as directed by your caregiver. If you were given medications for your condition, do not drive, operate machinery or power tools, or sign legal documents for 24 hours. Do not drink alcohol. Do not take sleeping pills or other medications that may interfere with treatment.  Continue all activities unless the activities cause more pain. When the pain lessens, slowly resume normal activities. Gradually increase the intensity and duration of the activities or exercise.  During periods of severe pain, bed rest may be  helpful. Lay or sit in any position that is comfortable.  Putting ice on the injured area.  Put ice in a bag.  Place a towel between your skin and the bag.  Leave the ice on for 15 to 20 minutes, 3 to 4 times a day.  Follow up with your caregiver for continued problems and no reason can be  found for the pain. If the pain becomes worse or does not go away, it may be necessary to repeat tests or do additional testing. Your caregiver may need to look further for a possible cause. SEEK IMMEDIATE MEDICAL CARE IF:  You have pain that is getting worse and is not relieved by medications.  You develop chest pain that is associated with shortness or breath, sweating, feeling sick to your stomach (nauseous), or throw up (vomit).  Your pain becomes localized to the abdomen.  You develop any new symptoms that seem different or that concern you. MAKE SURE YOU:   Understand these instructions.  Will watch your condition.  Will get help right away if you are not doing well or get worse.   This information is not intended to replace advice given to you by your health care provider. Make sure you discuss any questions you have with your health care provider.   Document Released: 06/10/2005 Document Revised: 09/02/2011 Document Reviewed: 02/12/2013 Elsevier Interactive Patient Education [employer]

## 2016-01-03 NOTE — MAU Provider Note (Signed)
Chief Complaint: Abdominal Pain   First Provider Initiated Contact with Patient 01/03/16 1224      SUBJECTIVE HPI: Christy Jones is a 30 y.o. [id number] at 158w6d by LMP, with history of ectopic pregnancy, who presents to maternity admissions reporting pain in right lower quadrant, pain in right side and vaginal discharge. Right lower quadrant pain began one week ago and is a throbbing, constant 7/10. She has not tried any treatments. Movement and changing position seem to make the pain worse.  Patient reported positive home pregnancy test, confirmed by urine hCG in clinic. Denies vaginal bleeding. In 2011 she had a ruptured ectopic pregnancy that presented with bleeding. Exploratory laparotomy with removal of left ectopic pregnancy was performed.   She has an odorous thick off-white discharge. She is sexually active with one partner, does not use protection. Denies vaginal itching/burning.  Denies urinary symptoms, h/a, dizziness, n/v, or fever/chills.      HPI  Past Medical History  Diagnosis Date  . Asthma   . Umbilical hernia   . Gestational diabetes   . Pregnancy induced hypertension     post- partem 2012  . Sickle cell trait (HCC)   . Urinary tract infection   . Headache(784.0)   . Chlamydia   . Trichomonas    Past Surgical History  Procedure Laterality Date  . Exploratory laparotomy      removal of ectopic pregnancy  . Ectopic pregnancy surgery    . Cholecystectomy  07/30/2012    Laproscopic     . Cholecystectomy N/A 07/30/2012    Procedure: LAPAROSCOPIC CHOLECYSTECTOMY WITH INTRAOPERATIVE CHOLANGIOGRAM;  Surgeon: Ernestene MentionHaywood M Ingram, MD;  [facility];  Service: General;  Laterality: N/A;   Social History   Social History  . Marital Status: Single    Spouse Name: N/A  . Number of Children: N/A  . Years of Education: N/A   Occupational History  . Not on file.   Social History Main Topics  . Smoking status: Former Smoker -- 0.25 packs/day for 10 years    Types:  Cigarettes  . Smokeless tobacco: Never Used  . Alcohol Use: No  . Drug Use: No  . Sexual Activity: Yes   Other Topics Concern  . Not on file   Social History Narrative   No current facility-administered medications on file prior to encounter.   Current Outpatient Prescriptions on File Prior to Encounter  Medication Sig Dispense Refill  . acetaminophen (TYLENOL) 325 MG tablet Take 2 tablets (650 mg total) by mouth every 6 (six) hours as needed for pain. 30 tablet 0  . albuterol (PROVENTIL HFA;VENTOLIN HFA) 108 (90 BASE) MCG/ACT inhaler Inhale 2 puffs into the lungs 4 (four) times daily as needed (asthma).    Marland Kitchen. omeprazole (PRILOSEC) 2 mg/mL SUSP Take 20 mLs (40 mg total) by mouth daily. (Patient not taking: Reported on 01/03/2016) 300 mL 0   Allergies  Allergen Reactions  . Lactose Intolerance (Gi) Nausea And Vomiting    ROS:  Review of Systems  Constitutional: Negative for fever and chills.  Genitourinary: Positive for vaginal discharge and pelvic pain. Negative for dysuria, urgency, frequency, vaginal bleeding and difficulty urinating.     I have reviewed patient's Past Medical Hx, Surgical Hx, Family Hx, Social Hx, medications and allergies.   Physical Exam   Patient Vitals for the past 24 hrs:  BP Temp Temp src Pulse Resp Height Weight  01/03/16 1344 113/81 mmHg - - 62 18 - -  01/03/16 1036 109/73 mmHg  98 F (36.7 C) Oral 69 18  (1.626 m) 150 lb (68.04 kg)   Constitutional: Well-developed, well-nourished female in no acute distress.  Cardiovascular: normal rate Respiratory: normal effort GI: Abd soft, non-tender.  MS: Extremities nontender, no edema, normal ROM Neurologic: Alert and oriented x 4.   Pelvic exam: Cervix pink, visually closed, without lesion, scant white creamy discharge, vaginal walls and external genitalia normal Bimanual exam: Cervix 0/long/high, firm, anterior, neg CMT, uterus nontender, nonenlarged, adnexa without tenderness, enlargement, or  mass   LAB RESULTS Results for orders placed or performed during the hospital encounter of 01/03/16 (from the past 24 hour(s))  Urinalysis, Routine w reflex microscopic (not at [facility])     Status: Abnormal   Collection Time: 01/03/16 10:40 AM  Result Value Ref Range   Color, Urine AMBER (A) YELLOW   APPearance CLEAR CLEAR   Specific Gravity, Urine 1.025 1.005 - 1.030   pH 6.0 5.0 - 8.0   Glucose, UA NEGATIVE NEGATIVE mg/dL   Hgb urine dipstick NEGATIVE NEGATIVE   Bilirubin Urine NEGATIVE NEGATIVE   Ketones, ur NEGATIVE NEGATIVE mg/dL   Protein, ur NEGATIVE NEGATIVE mg/dL   Nitrite NEGATIVE NEGATIVE   Leukocytes, UA TRACE (A) NEGATIVE  Urine microscopic-add on     Status: Abnormal   Collection Time: 01/03/16 10:40 AM  Result Value Ref Range   Squamous Epithelial / LPF 0-5 (A) NONE SEEN   WBC, UA 0-5 0 - 5 WBC/hpf   RBC / HPF NONE SEEN 0 - 5 RBC/hpf   Bacteria, UA RARE (A) NONE SEEN   Urine-Other MUCOUS PRESENT   Pregnancy, urine POC     Status: Abnormal   Collection Time: 01/03/16 11:19 AM  Result Value Ref Range   Preg Test, Ur POSITIVE (A) NEGATIVE  CBC     Status: Abnormal   Collection Time: 01/03/16 12:01 PM  Result Value Ref Range   WBC 11.1 (H) 4.0 - 10.5 K/uL   RBC 4.59 3.87 - 5.11 MIL/uL   Hemoglobin 14.2 12.0 - 15.0 g/dL   HCT 13.0 86.5 - 78.4 %   MCV 81.7 78.0 - 100.0 fL   MCH 30.9 26.0 - 34.0 pg   MCHC 37.9 (H) 30.0 - 36.0 g/dL   RDW 69.6 29.5 - 28.4 %   Platelets 217 150 - 400 K/uL  hCG, quantitative, pregnancy     Status: Abnormal   Collection Time: 01/03/16 12:01 PM  Result Value Ref Range   hCG, Beta Chain, Quant, S 56001 (H) <5 mIU/mL  Wet prep, genital     Status: Abnormal   Collection Time: 01/03/16  1:30 PM  Result Value Ref Range   Yeast Wet Prep HPF POC NONE SEEN NONE SEEN   Trich, Wet Prep NONE SEEN NONE SEEN   Clue Cells Wet Prep HPF POC PRESENT (A) NONE SEEN   WBC, Wet Prep HPF POC MANY (A) NONE SEEN   Sperm NONE SEEN        IMAGING US  Ob Comp Less 14 Wks  01/03/2016  CLINICAL DATA:  Right lower quadrant pain, cramping, pregnant EXAM: OBSTETRIC <14 WK [place] AND TRANSVAGINAL OB US TECHNIQUE: Both transabdominal and transvaginal ultrasound examinations were performed for complete evaluation of the gestation as well as the maternal uterus, adnexal regions, and pelvic cul-de-sac. Transvaginal technique was performed to assess early pregnancy. COMPARISON:  None. FINDINGS: Intrauterine gestational sac: Single Yolk sac:  Present Embryo:  Present Cardiac Activity: Present Heart Rate: 117  bpm CRL:  3.8  mm   6 w   1 d                  [place] EDC: 08/27/2016 Subchorionic hemorrhage:  None visualized. Maternal uterus/adnexae: Bilateral ovaries are within normal limits, noting a right corpus luteal cyst. No free fluid. IMPRESSION: Single live intrauterine gestation, with estimated gestational age [redacted] weeks 1 day by crown-rump length. Electronically Signed   By: Charline Bills M.D.   On: 01/03/2016 13:16   US Ob Transvaginal  01/03/2016  CLINICAL DATA:  Right lower quadrant pain, cramping, pregnant EXAM: OBSTETRIC <14 WK [place] AND TRANSVAGINAL OB US TECHNIQUE: Both transabdominal and transvaginal ultrasound examinations were performed for complete evaluation of the gestation as well as the maternal uterus, adnexal regions, and pelvic cul-de-sac. Transvaginal technique was performed to assess early pregnancy. COMPARISON:  None. FINDINGS: Intrauterine gestational sac: Single Yolk sac:  Present Embryo:  Present Cardiac Activity: Present Heart Rate: 117  bpm CRL:  3.8  mm   6 w   1 d                  [place] EDC: 08/27/2016 Subchorionic hemorrhage:  None visualized. Maternal uterus/adnexae: Bilateral ovaries are within normal limits, noting a right corpus luteal cyst. No free fluid. IMPRESSION: Single live intrauterine gestation, with estimated gestational age [redacted] weeks 1 day by crown-rump length. Electronically Signed   By: Charline Bills M.D.   On: 01/03/2016 13:16     MAU Management/MDM: Ordered UA, wet prep, GC and transvaginal US and reviewed results.  IUP noted on [place].  Pain mostly with movement, likely musculoskeletal pain.  Rest/ice/heat/warm bath/Tylenol for pain.  Pt to start prenatal care at [facility].  Pt stable at time of discharge.   ASSESSMENT 1. Normal IUP (intrauterine pregnancy) on prenatal ultrasound, first trimester   2. Pelvic pain affecting pregnancy in first trimester, antepartum     PLAN Discharge home   Medication List    TAKE these medications        acetaminophen 325 MG tablet  Commonly known as:  TYLENOL  Take 2 tablets (650 mg total) by mouth every 6 (six) hours as needed for pain.     albuterol 108 (90 Base) MCG/ACT inhaler  Commonly known as:  PROVENTIL HFA;VENTOLIN HFA  Inhale 2 puffs into the lungs 4 (four) times daily as needed (asthma).     omeprazole 2 mg/mL Susp  Commonly known as:  PRILOSEC  Take 20 mLs (40 mg total) by mouth daily.        Follow-up Information    Follow up with [facility].   Why:  Start prenatal care as soon as possible, Return to MAU as needed for emergencies   Contact information:   [address] [place] [address] [phone]       Sharen Counter Certified Nurse-Midwife 01/03/2016  5:22 PM   Addendum:  Mellody Drown prep positive for BV, pt symptomatic so will treat. Pt prefers Metrogel but it is more costly.  [facility].  See separate note about Rx.

## 2016-01-03 NOTE — MAU Note (Signed)
Pt C/O R mid side pain, also lower abd pain for the past week.  Pos HPT x 2 yesterday.  Denies bleeding.  Has thick white discharge with slight odor.

## 2016-01-04 LAB — CULTURE, OB URINE

## 2016-01-04 LAB — GC/CHLAMYDIA PROBE AMP (~~LOC~~) NOT AT ARMC
CHLAMYDIA, DNA PROBE: NEGATIVE
NEISSERIA GONORRHEA: NEGATIVE

## 2016-01-04 LAB — HIV ANTIBODY (ROUTINE TESTING W REFLEX): HIV SCREEN 4TH GENERATION: NONREACTIVE

## 2016-02-02 ENCOUNTER — Other Ambulatory Visit: Payer: Self-pay | Admitting: Obstetrics and Gynecology

## 2016-02-05 LAB — CYTOLOGY - PAP

## 2016-03-07 ENCOUNTER — Inpatient Hospital Stay (HOSPITAL_COMMUNITY)
Admission: AD | Admit: 2016-03-07 | Discharge: 2016-03-07 | Disposition: A | Discharge status: Home / Self Care | Payer: Medicaid Other | Source: Ambulatory Visit | Attending: Obstetrics and Gynecology | Admitting: Obstetrics and Gynecology

## 2016-03-07 ENCOUNTER — Encounter (HOSPITAL_COMMUNITY): Payer: Self-pay | Admitting: *Deleted

## 2016-03-07 DIAGNOSIS — O9989 Other specified diseases and conditions complicating pregnancy, childbirth and the puerperium: Secondary | ICD-10-CM | POA: Diagnosis not present

## 2016-03-07 DIAGNOSIS — O26892 Other specified pregnancy related conditions, second trimester: Secondary | ICD-10-CM | POA: Diagnosis not present

## 2016-03-07 DIAGNOSIS — Z3A16 16 weeks gestation of pregnancy: Secondary | ICD-10-CM | POA: Insufficient documentation

## 2016-03-07 DIAGNOSIS — F17210 Nicotine dependence, cigarettes, uncomplicated: Secondary | ICD-10-CM | POA: Insufficient documentation

## 2016-03-07 DIAGNOSIS — O99332 Smoking (tobacco) complicating pregnancy, second trimester: Secondary | ICD-10-CM | POA: Insufficient documentation

## 2016-03-07 DIAGNOSIS — R109 Unspecified abdominal pain: Secondary | ICD-10-CM | POA: Insufficient documentation

## 2016-03-07 DIAGNOSIS — O26899 Other specified pregnancy related conditions, unspecified trimester: Secondary | ICD-10-CM

## 2016-03-07 LAB — URINALYSIS, ROUTINE W REFLEX MICROSCOPIC
Bilirubin Urine: NEGATIVE
Glucose, UA: NEGATIVE mg/dL
HGB URINE DIPSTICK: NEGATIVE
Ketones, ur: NEGATIVE mg/dL
LEUKOCYTES UA: NEGATIVE
Nitrite: NEGATIVE
PROTEIN: NEGATIVE mg/dL
SPECIFIC GRAVITY, URINE: 1.02 (ref 1.005–1.030)
pH: 7 (ref 5.0–8.0)

## 2016-03-07 NOTE — MAU Provider Note (Signed)
History     CSN: [id number]  Arrival date and time: 03/07/16 [id number]   First Provider Initiated Contact with Patient 03/07/16 [phone]      Chief Complaint  Patient presents with  . Abdominal Pain   Colbert EwingKayvonna A Jones is a 30 y.o. [id number] at 8281w0d who presents with abdominal pain.  Denies vaginal bleeding or LOF.    Abdominal Pain  This is a new problem. The current episode started yesterday. The onset quality is sudden. The problem occurs constantly. The problem has been unchanged. Pain location: pain has travelled from epigastric to suprapubic to mid left abdomen. The pain is at a severity of 8/10. The quality of the pain is dull and sharp. The abdominal pain does not radiate. Associated symptoms include constipation. Pertinent negatives include no belching, diarrhea, dysuria, fever, nausea or vomiting. Nothing aggravates the pain. The pain is relieved by nothing. She has tried nothing for the symptoms.    OB History    Gravida Para Term Preterm AB Living   6 2 2  0 3 2   SAB TAB Ectopic Multiple Live Births   1 1 1  0 1      Past Medical History:  Diagnosis Date  . Asthma   . Chlamydia   . Gestational diabetes   . Headache(784.0)   . Pregnancy induced hypertension    post- partem 2012  . Sickle cell trait (HCC)   . Trichomonas   . Umbilical hernia   . Urinary tract infection     Past Surgical History:  Procedure Laterality Date  . CHOLECYSTECTOMY  07/30/2012   Laproscopic     . CHOLECYSTECTOMY N/A 07/30/2012   Procedure: LAPAROSCOPIC CHOLECYSTECTOMY WITH INTRAOPERATIVE CHOLANGIOGRAM;  Surgeon: Ernestene MentionHaywood M Ingram, MD;  [facility];  Service: General;  Laterality: N/A;  . ECTOPIC PREGNANCY SURGERY    . EXPLORATORY LAPAROTOMY     removal of ectopic pregnancy    Family History  Problem Relation Age of Onset  . Asthma Maternal Grandmother   . Diabetes Maternal Grandmother   . Anesthesia problems Neg Hx     Social History  Substance Use Topics  . Smoking status:  Current Every Day Smoker    Packs/day: 0.25    Years: 10.00    Types: Cigarettes  . Smokeless tobacco: Never Used  . Alcohol use No    Allergies:  Allergies  Allergen Reactions  . Lactose Intolerance (Gi) Nausea And Vomiting    Prescriptions Prior to Admission  Medication Sig Dispense Refill Last Dose  . acetaminophen (TYLENOL) 325 MG tablet Take 2 tablets (650 mg total) by mouth every 6 (six) hours as needed for pain. 30 tablet 0 12/22/2015  . albuterol (PROVENTIL HFA;VENTOLIN HFA) 108 (90 BASE) MCG/ACT inhaler Inhale 2 puffs into the lungs 4 (four) times daily as needed (asthma).   emergency  . metroNIDAZOLE (METROGEL) 0.75 % vaginal gel Place 1 Applicatorful vaginally at bedtime. Apply one applicatorful to vagina at bedtime for 5 days 70 g 1   . omeprazole (PRILOSEC) 2 mg/mL SUSP Take 20 mLs (40 mg total) by mouth daily. (Patient not taking: Reported on 01/03/2016) 300 mL 0     Review of Systems  Constitutional: Negative.  Negative for fever.  Gastrointestinal: Positive for abdominal pain and constipation. Negative for diarrhea, nausea and vomiting.  Genitourinary: Negative.  Negative for dysuria.   Physical Exam   Blood pressure 109/74, pulse 87, temperature 97.6 F (36.4 C), temperature source Oral, resp. rate 17, last menstrual period 11/16/2015.  Physical Exam  Nursing note and vitals reviewed. Constitutional: She is oriented to person, place, and time. She appears well-developed and well-nourished. No distress.  HENT:  Head: Normocephalic and atraumatic.  Eyes: Conjunctivae are normal. Right eye exhibits no discharge. Left eye exhibits no discharge. No scleral icterus.  Neck: Normal range of motion.  Cardiovascular: Normal rate, regular rhythm and normal heart sounds.   No murmur heard. Respiratory: Effort normal and breath sounds normal. No respiratory distress. She has no wheezes.  GI: Soft. Bowel sounds are normal. There is no tenderness. There is no rebound and no  guarding. A hernia (reducible umbilical hernia) is present.  Genitourinary:  Genitourinary Comments: Cervix closed  Neurological: She is alert and oriented to person, place, and time.  Skin: Skin is warm and dry. She is not diaphoretic.  Psychiatric: She has a normal mood and affect. Her behavior is normal. Judgment and thought content normal.   MAU Course  Procedures Results for orders placed or performed during the hospital encounter of 03/07/16 (from the past 24 hour(s))  Urinalysis, Routine w reflex microscopic (not at [facility])     Status: None   Collection Time: 03/07/16  8:59 AM  Result Value Ref Range   Color, Urine YELLOW YELLOW   APPearance CLEAR CLEAR   Specific Gravity, Urine 1.020 1.005 - 1.030   pH 7.0 5.0 - 8.0   Glucose, UA NEGATIVE NEGATIVE mg/dL   Hgb urine dipstick NEGATIVE NEGATIVE   Bilirubin Urine NEGATIVE NEGATIVE   Ketones, ur NEGATIVE NEGATIVE mg/dL   Protein, ur NEGATIVE NEGATIVE mg/dL   Nitrite NEGATIVE NEGATIVE   Leukocytes, UA NEGATIVE NEGATIVE    MDM FHT 155 by doppler Pain decreased from 8 to 2 without intervention Pt declined medication in MAU Cervix closed S/w Dr. Tenny Craw; discharge home Assessment and Plan  A:  1. Abdominal pain affecting pregnancy     P: Discharge home Discussed at home treatment for constipation Discussed reasons to return to MAU Keep f/u with OB  [name] 03/07/2016, 9:10 AM

## 2016-03-07 NOTE — Discharge Instructions (Signed)
Hemorrhoids Hemorrhoids are puffy (swollen) veins around the rectum or anus. Hemorrhoids can cause pain, itching, bleeding, or irritation. HOME CARE  Eat foods with fiber, such as whole grains, beans, nuts, fruits, and vegetables. Ask your doctor about taking products with added fiber in them (fibersupplements).  Drink enough fluid to keep your pee (urine) clear or pale yellow.  Exercise often.  Go to the bathroom when you have the urge to poop. Do not wait.  Avoid straining to poop (bowel movement).  Keep the butt area dry and clean. Use wet toilet paper or moist paper towels.  Medicated creams and medicine inserted into the anus (anal suppository) may be used or applied as told.  Only take medicine as told by your doctor.  Take a warm water bath (sitz bath) for 15-20 minutes to ease pain. Do this 3-4 times a day.  Place ice packs on the area if it is tender or puffy. Use the ice packs between the warm water baths.  Put ice in a plastic bag.  Place a towel between your skin and the bag.  Leave the ice on for 15-20 minutes, 03-04 times a day.  Do not use a donut-shaped pillow or sit on the toilet for a long time. GET HELP RIGHT AWAY IF:   You have more pain that is not controlled by treatment or medicine.  You have bleeding that will not stop.  You have trouble or are unable to poop (bowel movement).  You have pain or puffiness outside the area of the hemorrhoids. MAKE SURE YOU:   Understand these instructions.  Will watch your condition.  Will get help right away if you are not doing well or get worse.   This information is not intended to replace advice given to you by your health care provider. Make sure you discuss any questions you have with your health care provider.   Document Released: 03/19/2008 Document Revised: 05/27/2012 Document Reviewed: 04/21/2012 Elsevier Interactive Patient Education 2016 [employer]. Constipation, Adult Constipation is when a  person:  Poops (has a bowel movement) less than 3 times a week.  Has a hard time pooping.  Has poop that is dry, hard, or bigger than normal. HOME CARE   Eat foods with a lot of fiber in them. This includes fruits, vegetables, beans, and whole grains such as brown rice.  Avoid fatty foods and foods with a lot of sugar. This includes french fries, hamburgers, cookies, candy, and soda.  If you are not getting enough fiber from food, take products with added fiber in them (supplements).  Drink enough fluid to keep your pee (urine) clear or pale yellow.  Exercise on a regular basis, or as told by your doctor.  Go to the restroom when you feel like you need to poop. Do not hold it.  Only take medicine as told by your doctor. Do not take medicines that help you poop (laxatives) without talking to your doctor first. GET HELP RIGHT AWAY IF:   You have bright red blood in your poop (stool).  Your constipation lasts more than 4 days or gets worse.  You have belly (abdominal) or butt (rectal) pain.  You have thin poop (as thin as a pencil).  You lose weight, and it cannot be explained. MAKE SURE YOU:   Understand these instructions.  Will watch your condition.  Will get help right away if you are not doing well or get worse.   This information is not intended to replace  advice given to you by your health care provider. Make sure you discuss any questions you have with your health care provider.   Document Released: 11/27/2007 Document Revised: 07/01/2014 Document Reviewed: 03/22/2013 Elsevier Interactive Patient Education 2016 Elsevier Inc. Abdominal Pain During Pregnancy Belly (abdominal) pain is common during pregnancy. Most of the time, it is not a serious problem. Other times, it can be a sign that something is wrong with the pregnancy. Always tell your doctor if you have belly pain. HOME CARE Monitor your belly pain for any changes. The following actions may help you feel  better:  Do not have sex (intercourse) or put anything in your vagina until you feel better.  Rest until your pain stops.  Drink clear fluids if you feel sick to your stomach (nauseous). Do not eat solid food until you feel better.  Only take medicine as told by your doctor.  Keep all doctor visits as told. GET HELP RIGHT AWAY IF:   You are bleeding, leaking fluid, or pieces of tissue come out of your vagina.  You have more pain or cramping.  You keep throwing up (vomiting).  You have pain when you pee (urinate) or have blood in your pee.  You have a fever.  You do not feel your baby moving as much.  You feel very weak or feel like passing out.  You have trouble breathing, with or without belly pain.  You have a very bad headache and belly pain.  You have fluid leaking from your vagina and belly pain.  You keep having watery poop (diarrhea).  Your belly pain does not go away after resting, or the pain gets worse. MAKE SURE YOU:   Understand these instructions.  Will watch your condition.  Will get help right away if you are not doing well or get worse.   This information is not intended to replace advice given to you by your health care provider. Make sure you discuss any questions you have with your health care provider.   Document Released: 05/29/2009 Document Revised: 02/10/2013 Document Reviewed: 01/07/2013 Elsevier Interactive Patient Education [employer] Elsevier Inc.

## 2016-03-07 NOTE — MAU Note (Addendum)
Pt started having upper abdominal pain yesterday that made it hard to breathe.  She went home took a hot shower rested and it moved to her left side.  She said it is now lower abdominal pain denies LOF/VB.  Has not taken anything for it.  Last intercourse was three days ago.

## 2016-06-10 ENCOUNTER — Encounter: Payer: Medicaid Other | Attending: Obstetrics and Gynecology | Admitting: Skilled Nursing Facility1

## 2016-06-10 DIAGNOSIS — O99810 Abnormal glucose complicating pregnancy: Secondary | ICD-10-CM

## 2016-06-10 DIAGNOSIS — O24419 Gestational diabetes mellitus in pregnancy, unspecified control: Secondary | ICD-10-CM | POA: Diagnosis not present

## 2016-06-10 DIAGNOSIS — Z3A00 Weeks of gestation of pregnancy not specified: Secondary | ICD-10-CM | POA: Insufficient documentation

## 2016-06-10 DIAGNOSIS — Z713 Dietary counseling and surveillance: Secondary | ICD-10-CM | POA: Insufficient documentation

## 2016-06-11 ENCOUNTER — Encounter: Payer: Self-pay | Admitting: Skilled Nursing Facility1

## 2016-06-11 NOTE — Progress Notes (Signed)
  Patient was seen on 06/10/2016 for Gestational Diabetes self-management class at the [facility]. The following learning objectives were met by the patient during this course:   States the definition of Gestational Diabetes  States why dietary management is important in controlling blood glucose  Describes the effects each nutrient has on blood glucose levels  Demonstrates ability to create a balanced meal plan  Demonstrates carbohydrate counting   States when to check blood glucose levels involving a total of 4 separate occurences in a day  Demonstrates proper blood glucose monitoring techniques  States the effect of stress and exercise on blood glucose levels  States the importance of limiting caffeine and abstaining from alcohol and smoking  Demonstrates the knowledge the glucometer provided in class may not be covered by their insurance and to call their insurance provider immediately after class to know which glucometer their insurance provider does cover as well as calling their physician the next day for a prescription to the glucometer their insurance does cover (if the one provided is not) as well as the lancets and strips for that meter.  Blood glucose monitor given: accu check guide Lot # [id number] Exp: 02/19 Blood glucose reading: 94  Patient instructed to monitor glucose levels: FBS: 60 - <90 1 hour: <140 2 hour: <120  *Patient received handouts:  Nutrition Diabetes and Pregnancy  Carbohydrate Counting List  Patient will be seen for follow-up as needed.

## 2016-07-15 ENCOUNTER — Other Ambulatory Visit: Payer: Self-pay | Admitting: Obstetrics & Gynecology

## 2016-07-22 ENCOUNTER — Other Ambulatory Visit: Payer: Self-pay | Admitting: Obstetrics & Gynecology

## 2016-08-09 ENCOUNTER — Telehealth (HOSPITAL_COMMUNITY): Payer: Self-pay | Admitting: *Deleted

## 2016-08-09 NOTE — Telephone Encounter (Signed)
Preadmission screen  

## 2016-08-11 ENCOUNTER — Encounter (HOSPITAL_COMMUNITY)
Admission: AD | Disposition: A | Discharge status: Home / Self Care | Payer: Self-pay | Source: Ambulatory Visit | Attending: Obstetrics and Gynecology

## 2016-08-11 ENCOUNTER — Inpatient Hospital Stay (HOSPITAL_COMMUNITY): Payer: Medicaid Other | Admitting: Anesthesiology

## 2016-08-11 ENCOUNTER — Inpatient Hospital Stay (HOSPITAL_COMMUNITY)
Admission: AD | Admit: 2016-08-11 | Discharge: 2016-08-14 | DRG: 766 | Disposition: A | Discharge status: Home / Self Care | Payer: Medicaid Other | Source: Ambulatory Visit | Attending: Obstetrics and Gynecology | Admitting: Obstetrics and Gynecology

## 2016-08-11 ENCOUNTER — Encounter (HOSPITAL_COMMUNITY): Payer: Self-pay | Admitting: Certified Nurse Midwife

## 2016-08-11 DIAGNOSIS — O9902 Anemia complicating childbirth: Secondary | ICD-10-CM | POA: Diagnosis present

## 2016-08-11 DIAGNOSIS — Z3A38 38 weeks gestation of pregnancy: Secondary | ICD-10-CM | POA: Diagnosis not present

## 2016-08-11 DIAGNOSIS — O322XX0 Maternal care for transverse and oblique lie, not applicable or unspecified: Principal | ICD-10-CM | POA: Diagnosis present

## 2016-08-11 DIAGNOSIS — O3663X0 Maternal care for excessive fetal growth, third trimester, not applicable or unspecified: Secondary | ICD-10-CM | POA: Diagnosis present

## 2016-08-11 DIAGNOSIS — Z833 Family history of diabetes mellitus: Secondary | ICD-10-CM | POA: Diagnosis not present

## 2016-08-11 DIAGNOSIS — O99334 Smoking (tobacco) complicating childbirth: Secondary | ICD-10-CM | POA: Diagnosis present

## 2016-08-11 DIAGNOSIS — D573 Sickle-cell trait: Secondary | ICD-10-CM | POA: Diagnosis present

## 2016-08-11 DIAGNOSIS — O24425 Gestational diabetes mellitus in childbirth, controlled by oral hypoglycemic drugs: Secondary | ICD-10-CM | POA: Diagnosis present

## 2016-08-11 DIAGNOSIS — F17210 Nicotine dependence, cigarettes, uncomplicated: Secondary | ICD-10-CM | POA: Diagnosis present

## 2016-08-11 DIAGNOSIS — Z3493 Encounter for supervision of normal pregnancy, unspecified, third trimester: Secondary | ICD-10-CM | POA: Diagnosis present

## 2016-08-11 LAB — GLUCOSE, CAPILLARY: GLUCOSE-CAPILLARY: 82 mg/dL (ref 65–99)

## 2016-08-11 LAB — CBC
HCT: 31.1 % — ABNORMAL LOW (ref 36.0–46.0)
HEMOGLOBIN: 11.2 g/dL — AB (ref 12.0–15.0)
MCH: 28.3 pg (ref 26.0–34.0)
MCHC: 36 g/dL (ref 30.0–36.0)
MCV: 78.5 fL (ref 78.0–100.0)
Platelets: 123 10*3/uL — ABNORMAL LOW (ref 150–400)
RBC: 3.96 MIL/uL (ref 3.87–5.11)
RDW: 13.9 % (ref 11.5–15.5)
WBC: 8.2 10*3/uL (ref 4.0–10.5)

## 2016-08-11 LAB — TYPE AND SCREEN
ABO/RH(D): O POS
Antibody Screen: NEGATIVE

## 2016-08-11 SURGERY — Surgical Case
Anesthesia: Spinal

## 2016-08-11 MED ORDER — BUPIVACAINE-EPINEPHRINE (PF) 0.25% -1:200000 IJ SOLN
INTRAMUSCULAR | Status: AC
  Filled 2016-08-11: qty 30

## 2016-08-11 MED ORDER — MORPHINE SULFATE (PF) 0.5 MG/ML IJ SOLN
INTRAMUSCULAR | Status: AC
  Filled 2016-08-11: qty 10

## 2016-08-11 MED ORDER — FAMOTIDINE IN NACL 20-0.9 MG/50ML-% IV SOLN
20.0000 mg | Freq: Once | INTRAVENOUS | Status: AC
  Administered 2016-08-11: 20 mg via INTRAVENOUS
  Filled 2016-08-11: qty 50

## 2016-08-11 MED ORDER — BUPIVACAINE HCL (PF) 0.25 % IJ SOLN
INTRAMUSCULAR | Status: AC
  Filled 2016-08-11: qty 30

## 2016-08-11 MED ORDER — LACTATED RINGERS IV SOLN
INTRAVENOUS | Status: DC
  Administered 2016-08-11 – 2016-08-12 (×4): via INTRAVENOUS

## 2016-08-11 MED ORDER — MORPHINE SULFATE (PF) 0.5 MG/ML IJ SOLN
INTRAMUSCULAR | Status: DC | PRN
  Administered 2016-08-11: .2 mg via EPIDURAL

## 2016-08-11 MED ORDER — FENTANYL CITRATE (PF) 100 MCG/2ML IJ SOLN
INTRAMUSCULAR | Status: AC
  Filled 2016-08-11: qty 2

## 2016-08-11 MED ORDER — SOD CITRATE-CITRIC ACID 500-334 MG/5ML PO SOLN
30.0000 mL | Freq: Once | ORAL | Status: AC
  Administered 2016-08-11: 30 mL via ORAL
  Filled 2016-08-11: qty 15

## 2016-08-11 MED ORDER — FENTANYL CITRATE (PF) 100 MCG/2ML IJ SOLN
INTRAMUSCULAR | Status: DC | PRN
  Administered 2016-08-11 (×2): 25 ug via INTRAVENOUS

## 2016-08-11 MED ORDER — CEFAZOLIN SODIUM-DEXTROSE 2-4 GM/100ML-% IV SOLN
2.0000 g | INTRAVENOUS | Status: AC
  Administered 2016-08-11: 2 g via INTRAVENOUS
  Filled 2016-08-11: qty 100

## 2016-08-11 SURGICAL SUPPLY — 30 items
CHLORAPREP W/TINT 26ML (MISCELLANEOUS) ×2 IMPLANT
CLAMP CORD UMBIL (MISCELLANEOUS) IMPLANT
CLOTH BEACON ORANGE TIMEOUT ST (SAFETY) ×2 IMPLANT
DERMABOND ADVANCED (GAUZE/BANDAGES/DRESSINGS) ×1
DERMABOND ADVANCED .7 DNX12 (GAUZE/BANDAGES/DRESSINGS) ×1 IMPLANT
DRSG OPSITE POSTOP 4X10 (GAUZE/BANDAGES/DRESSINGS) ×2 IMPLANT
ELECT REM PT RETURN 9FT ADLT (ELECTROSURGICAL) ×2
ELECTRODE REM PT RTRN 9FT ADLT (ELECTROSURGICAL) ×1 IMPLANT
EXTRACTOR VACUUM M CUP 4 TUBE (SUCTIONS) IMPLANT
GLOVE BIO SURGEON STRL SZ7 (GLOVE) ×2 IMPLANT
GLOVE BIOGEL PI IND STRL 7.0 (GLOVE) ×1 IMPLANT
GLOVE BIOGEL PI INDICATOR 7.0 (GLOVE) ×1
GOWN STRL REUS W/TWL LRG LVL3 (GOWN DISPOSABLE) ×4 IMPLANT
KIT ABG SYR 3ML LUER SLIP (SYRINGE) IMPLANT
NEEDLE HYPO 22GX1.5 SAFETY (NEEDLE) IMPLANT
NEEDLE HYPO 25X5/8 SAFETYGLIDE (NEEDLE) IMPLANT
NS IRRIG 1000ML POUR BTL (IV SOLUTION) ×2 IMPLANT
PACK C SECTION WH (CUSTOM PROCEDURE TRAY) ×2 IMPLANT
PAD OB MATERNITY 4.3X12.25 (PERSONAL CARE ITEMS) ×2 IMPLANT
PENCIL SMOKE EVAC W/HOLSTER (ELECTROSURGICAL) ×2 IMPLANT
RTRCTR C-SECT PINK 25CM LRG (MISCELLANEOUS) ×2 IMPLANT
SUT CHROMIC 1 CTX 36 (SUTURE) ×4 IMPLANT
SUT CHROMIC 2 0 CT 1 (SUTURE) ×2 IMPLANT
SUT PDS AB 0 CTX 60 (SUTURE) ×2 IMPLANT
SUT VIC AB 2-0 CT1 27 (SUTURE) ×1
SUT VIC AB 2-0 CT1 TAPERPNT 27 (SUTURE) ×1 IMPLANT
SUT VIC AB 4-0 KS 27 (SUTURE) ×2 IMPLANT
SYR 30ML LL (SYRINGE) IMPLANT
TOWEL OR 17X24 6PK STRL BLUE (TOWEL DISPOSABLE) ×2 IMPLANT
TRAY FOLEY CATH SILVER 14FR (SET/KITS/TRAYS/PACK) ×2 IMPLANT

## 2016-08-11 NOTE — H&P (Addendum)
Christy Jones is a 31 y.o. female presenting for painful contractions  31 yo [id number] @ 38+2 presenting for painful contractions and was found to be in labor. Her pregnancy has been complicated by A2 gestational diabetes with poor compliance. EFW on last US was greater than 4500 grams and the patient was counseled about primary cesarean delivery. Additionally, Fetal presentation on BPP/AFI Friday was transverse lie. Risks/Benefits/Alternatives were reviewed with the patient and she wishes to proceed. OB History    Gravida Para Term Preterm AB Living   6 2 2  0 3 2   SAB TAB Ectopic Multiple Live Births   1 1 1  0 1     Past Medical History:  Diagnosis Date  . Asthma   . Chlamydia   . Gestational diabetes   . Headache(784.0)   . Pregnancy induced hypertension    post- partem 2012  . Sickle cell trait (HCC)   . Trichomonas   . Umbilical hernia   . Urinary tract infection    Past Surgical History:  Procedure Laterality Date  . CHOLECYSTECTOMY  07/30/2012   Laproscopic     . CHOLECYSTECTOMY N/A 07/30/2012   Procedure: LAPAROSCOPIC CHOLECYSTECTOMY WITH INTRAOPERATIVE CHOLANGIOGRAM;  Surgeon: Ernestene MentionHaywood M Ingram, MD;  [facility];  Service: General;  Laterality: N/A;  . ECTOPIC PREGNANCY SURGERY    . EXPLORATORY LAPAROTOMY     removal of ectopic pregnancy   Family History: family history includes Asthma in her maternal grandmother; Diabetes in her maternal grandmother. Social History:  reports that she has been smoking Cigarettes.  She has a 2.50 pack-year smoking history. She has never used smokeless tobacco. She reports that she does not drink alcohol or use drugs.     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 Exam Physical Exam  Prenatal labs: ABO, Rh:  O+ Antibody:  Negative Rubella:  Imm RPR:   NR HBsAg:   Neg HIV: Non Reactive (07/12 1201)  GBS:   Negative  Assessment/Plan: 1) Admit 2) Proceed with cesarean section 3) SCDs 4) Ancef 2 gm to OR   Nicholad Kautzman H. 08/11/2016, 11:03 PM

## 2016-08-11 NOTE — MAU Note (Signed)
Pt presents complaining of contractions all day. Denies bleeding or leaking of fluid. Reports good fetal movement. Scheduled c/s for macrosomia.

## 2016-08-11 NOTE — Anesthesia Preprocedure Evaluation (Signed)
Anesthesia Evaluation  Patient identified by MRN, date of birth, ID band Patient awake    Reviewed: Allergy & Precautions, H&P , NPO status , Patient's Chart, lab work & pertinent test results  History of Anesthesia Complications Negative for: history of anesthetic complications  Airway Mallampati: II       Dental no notable dental hx. (+) Teeth Intact   Pulmonary asthma , Current Smoker,    Pulmonary exam normal        Cardiovascular hypertension, I Rhythm:regular Rate:Normal     Neuro/Psych negative neurological ROS  negative psych ROS   GI/Hepatic negative GI ROS, Neg liver ROS,   Endo/Other  diabetes, Gestationalc pregnacny  Renal/GU negative Renal ROS     Musculoskeletal   Abdominal   Peds  Hematology negative hematology ROS (+)   Anesthesia Other Findings   Reproductive/Obstetrics (+) Pregnancy                             Anesthesia Physical  Anesthesia Plan  ASA: II  Anesthesia Plan: Spinal   Post-op Pain Management:    Induction:   Airway Management Planned:   Additional Equipment:   Intra-op Plan:   Post-operative Plan:   Informed Consent: I have reviewed the patients History and Physical, chart, labs and discussed the procedure including the risks, benefits and alternatives for the proposed anesthesia with the patient or authorized representative who has indicated his/her understanding and acceptance.   Dental advisory given  Plan Discussed with: CRNA  Anesthesia Plan Comments:         Anesthesia Quick Evaluation

## 2016-08-12 ENCOUNTER — Encounter (HOSPITAL_COMMUNITY): Payer: Self-pay

## 2016-08-12 LAB — CBC
HEMATOCRIT: 26.7 % — AB (ref 36.0–46.0)
Hemoglobin: 9.7 g/dL — ABNORMAL LOW (ref 12.0–15.0)
MCH: 28.4 pg (ref 26.0–34.0)
MCHC: 36.3 g/dL — AB (ref 30.0–36.0)
MCV: 78.1 fL (ref 78.0–100.0)
Platelets: 102 10*3/uL — ABNORMAL LOW (ref 150–400)
RBC: 3.42 MIL/uL — ABNORMAL LOW (ref 3.87–5.11)
RDW: 14 % (ref 11.5–15.5)
WBC: 11.4 10*3/uL — ABNORMAL HIGH (ref 4.0–10.5)

## 2016-08-12 LAB — GLUCOSE, CAPILLARY
Glucose-Capillary: 105 mg/dL — ABNORMAL HIGH (ref 65–99)
Glucose-Capillary: 86 mg/dL (ref 65–99)

## 2016-08-12 LAB — RPR: RPR: NONREACTIVE

## 2016-08-12 MED ORDER — SODIUM CHLORIDE 0.9% FLUSH: 3.0000 mL | INTRAVENOUS | Status: DC | PRN

## 2016-08-12 MED ORDER — PRENATAL MULTIVITAMIN CH
1.0000 | ORAL_TABLET | Freq: Every day | ORAL | Status: DC
  Administered 2016-08-13 – 2016-08-14 (×2): 1 via ORAL
  Filled 2016-08-12 (×3): qty 1

## 2016-08-12 MED ORDER — ONDANSETRON HCL 4 MG/2ML IJ SOLN
INTRAMUSCULAR | Status: AC
  Filled 2016-08-12: qty 2

## 2016-08-12 MED ORDER — DIPHENHYDRAMINE HCL 25 MG PO CAPS: 25.0000 mg | ORAL_CAPSULE | Freq: Four times a day (QID) | ORAL | Status: DC | PRN

## 2016-08-12 MED ORDER — SCOPOLAMINE 1 MG/3DAYS TD PT72
MEDICATED_PATCH | TRANSDERMAL | Status: AC
  Filled 2016-08-12: qty 1

## 2016-08-12 MED ORDER — KETOROLAC TROMETHAMINE 30 MG/ML IJ SOLN
INTRAMUSCULAR | Status: AC
  Filled 2016-08-12: qty 1

## 2016-08-12 MED ORDER — BUTORPHANOL TARTRATE 1 MG/ML IJ SOLN
1.0000 mg | Freq: Once | INTRAMUSCULAR | Status: AC
  Administered 2016-08-12: 1 mg via INTRAVENOUS
  Filled 2016-08-12: qty 1

## 2016-08-12 MED ORDER — KETOROLAC TROMETHAMINE 30 MG/ML IJ SOLN: 30.0000 mg | Freq: Four times a day (QID) | INTRAMUSCULAR | Status: AC | PRN

## 2016-08-12 MED ORDER — LACTATED RINGERS IV SOLN
INTRAVENOUS | Status: DC
  Administered 2016-08-12: 03:00:00 via INTRAVENOUS

## 2016-08-12 MED ORDER — TETANUS-DIPHTH-ACELL PERTUSSIS 5-2.5-18.5 LF-MCG/0.5 IM SUSP: 0.5000 mL | Freq: Once | INTRAMUSCULAR | Status: DC

## 2016-08-12 MED ORDER — WITCH HAZEL-GLYCERIN EX PADS: 1.0000 "application " | MEDICATED_PAD | CUTANEOUS | Status: DC | PRN

## 2016-08-12 MED ORDER — EPHEDRINE 5 MG/ML INJ
INTRAVENOUS | Status: AC
  Filled 2016-08-12: qty 10

## 2016-08-12 MED ORDER — METHYLERGONOVINE MALEATE 0.2 MG PO TABS: 0.2000 mg | ORAL_TABLET | ORAL | Status: DC | PRN

## 2016-08-12 MED ORDER — SCOPOLAMINE 1 MG/3DAYS TD PT72
1.0000 | MEDICATED_PATCH | Freq: Once | TRANSDERMAL | Status: DC
  Filled 2016-08-12: qty 1

## 2016-08-12 MED ORDER — SIMETHICONE 80 MG PO CHEW
80.0000 mg | CHEWABLE_TABLET | ORAL | Status: DC
  Administered 2016-08-13 (×2): 80 mg via ORAL
  Filled 2016-08-12 (×2): qty 1

## 2016-08-12 MED ORDER — OXYCODONE-ACETAMINOPHEN 5-325 MG PO TABS
2.0000 | ORAL_TABLET | ORAL | Status: DC | PRN
  Administered 2016-08-13: 2 via ORAL
  Filled 2016-08-12: qty 2

## 2016-08-12 MED ORDER — DIBUCAINE 1 % RE OINT: 1.0000 "application " | TOPICAL_OINTMENT | RECTAL | Status: DC | PRN

## 2016-08-12 MED ORDER — HYDROMORPHONE HCL 1 MG/ML IJ SOLN
INTRAMUSCULAR | Status: AC
Start: 2016-08-12 — End: 2016-08-12
  Filled 2016-08-12: qty 1

## 2016-08-12 MED ORDER — OXYTOCIN 10 UNIT/ML IJ SOLN
INTRAMUSCULAR | Status: AC
  Filled 2016-08-12: qty 4

## 2016-08-12 MED ORDER — ONDANSETRON HCL 4 MG/2ML IJ SOLN
INTRAMUSCULAR | Status: DC | PRN
  Administered 2016-08-11: 4 mg via INTRAVENOUS

## 2016-08-12 MED ORDER — COCONUT OIL OIL: 1.0000 "application " | TOPICAL_OIL | Status: DC | PRN

## 2016-08-12 MED ORDER — PHENYLEPHRINE 40 MCG/ML (10ML) SYRINGE FOR IV PUSH (FOR BLOOD PRESSURE SUPPORT)
PREFILLED_SYRINGE | INTRAVENOUS | Status: AC
  Filled 2016-08-12: qty 20

## 2016-08-12 MED ORDER — DIPHENHYDRAMINE HCL 25 MG PO CAPS
25.0000 mg | ORAL_CAPSULE | ORAL | Status: DC | PRN
  Filled 2016-08-12: qty 1

## 2016-08-12 MED ORDER — OXYCODONE-ACETAMINOPHEN 5-325 MG PO TABS
1.0000 | ORAL_TABLET | ORAL | Status: DC | PRN
  Administered 2016-08-12 – 2016-08-14 (×6): 1 via ORAL
  Filled 2016-08-12 (×6): qty 1

## 2016-08-12 MED ORDER — NALBUPHINE HCL 10 MG/ML IJ SOLN: 5.0000 mg | INTRAMUSCULAR | Status: DC | PRN

## 2016-08-12 MED ORDER — BUPIVACAINE IN DEXTROSE 0.75-8.25 % IT SOLN
INTRATHECAL | Status: DC | PRN
  Administered 2016-08-11: 1.8 mL via INTRATHECAL

## 2016-08-12 MED ORDER — MEPERIDINE HCL 25 MG/ML IJ SOLN: 6.2500 mg | INTRAMUSCULAR | Status: DC | PRN

## 2016-08-12 MED ORDER — NALBUPHINE HCL 10 MG/ML IJ SOLN: 5.0000 mg | Freq: Once | INTRAMUSCULAR | Status: DC | PRN

## 2016-08-12 MED ORDER — PHENYLEPHRINE 8 MG IN D5W 100 ML (0.08MG/ML) PREMIX OPTIME
INJECTION | INTRAVENOUS | Status: AC
  Filled 2016-08-12: qty 100

## 2016-08-12 MED ORDER — DIPHENHYDRAMINE HCL 50 MG/ML IJ SOLN
12.5000 mg | INTRAMUSCULAR | Status: DC | PRN
  Administered 2016-08-12: 12.5 mg via INTRAVENOUS

## 2016-08-12 MED ORDER — GLYCOPYRROLATE 0.2 MG/ML IJ SOLN
INTRAMUSCULAR | Status: DC | PRN
  Administered 2016-08-11: 0.1 mg via INTRAVENOUS

## 2016-08-12 MED ORDER — IBUPROFEN 600 MG PO TABS
600.0000 mg | ORAL_TABLET | Freq: Four times a day (QID) | ORAL | Status: DC
  Administered 2016-08-12 – 2016-08-14 (×9): 600 mg via ORAL
  Filled 2016-08-12 (×10): qty 1

## 2016-08-12 MED ORDER — SENNOSIDES-DOCUSATE SODIUM 8.6-50 MG PO TABS
2.0000 | ORAL_TABLET | ORAL | Status: DC
  Administered 2016-08-13 (×2): 2 via ORAL
  Filled 2016-08-12 (×2): qty 2

## 2016-08-12 MED ORDER — NALOXONE HCL 0.4 MG/ML IJ SOLN: 0.4000 mg | INTRAMUSCULAR | Status: DC | PRN

## 2016-08-12 MED ORDER — SIMETHICONE 80 MG PO CHEW
80.0000 mg | CHEWABLE_TABLET | Freq: Three times a day (TID) | ORAL | Status: DC
  Administered 2016-08-12 – 2016-08-14 (×6): 80 mg via ORAL
  Filled 2016-08-12 (×6): qty 1

## 2016-08-12 MED ORDER — MENTHOL 3 MG MT LOZG: 1.0000 | LOZENGE | OROMUCOSAL | Status: DC | PRN

## 2016-08-12 MED ORDER — DIPHENHYDRAMINE HCL 50 MG/ML IJ SOLN
INTRAMUSCULAR | Status: AC
  Filled 2016-08-12: qty 1

## 2016-08-12 MED ORDER — ACETAMINOPHEN 325 MG PO TABS: 650.0000 mg | ORAL_TABLET | ORAL | Status: DC | PRN

## 2016-08-12 MED ORDER — HYDROMORPHONE HCL 1 MG/ML IJ SOLN
0.2500 mg | INTRAMUSCULAR | Status: DC | PRN
  Administered 2016-08-12: 0.25 mg via INTRAVENOUS

## 2016-08-12 MED ORDER — SCOPOLAMINE 1 MG/3DAYS TD PT72
MEDICATED_PATCH | TRANSDERMAL | Status: DC | PRN
  Administered 2016-08-11: 1 via TRANSDERMAL

## 2016-08-12 MED ORDER — OXYTOCIN 10 UNIT/ML IJ SOLN
INTRAVENOUS | Status: DC | PRN
  Administered 2016-08-11: 40 [IU] via INTRAVENOUS

## 2016-08-12 MED ORDER — OXYTOCIN 40 UNITS IN LACTATED RINGERS INFUSION - SIMPLE MED: 2.5000 [IU]/h | INTRAVENOUS | Status: AC

## 2016-08-12 MED ORDER — METHYLERGONOVINE MALEATE 0.2 MG/ML IJ SOLN: 0.2000 mg | INTRAMUSCULAR | Status: DC | PRN

## 2016-08-12 MED ORDER — PHENYLEPHRINE HCL 10 MG/ML IJ SOLN
INTRAMUSCULAR | Status: DC | PRN
  Administered 2016-08-11 (×2): 80 ug via INTRAVENOUS
  Administered 2016-08-11: 40 ug via INTRAVENOUS
  Administered 2016-08-12: 80 ug via INTRAVENOUS
  Administered 2016-08-12: 40 ug via INTRAVENOUS

## 2016-08-12 MED ORDER — ONDANSETRON HCL 4 MG/2ML IJ SOLN
4.0000 mg | Freq: Three times a day (TID) | INTRAMUSCULAR | Status: DC | PRN
Start: 2016-08-12 — End: 2016-08-14

## 2016-08-12 MED ORDER — NALOXONE HCL 2 MG/2ML IJ SOSY
1.0000 ug/kg/h | PREFILLED_SYRINGE | INTRAVENOUS | Status: DC | PRN
  Filled 2016-08-12: qty 2

## 2016-08-12 MED ORDER — PROMETHAZINE HCL 25 MG/ML IJ SOLN: 6.2500 mg | INTRAMUSCULAR | Status: DC | PRN

## 2016-08-12 MED ORDER — SIMETHICONE 80 MG PO CHEW: 80.0000 mg | CHEWABLE_TABLET | ORAL | Status: DC | PRN

## 2016-08-12 MED ORDER — ZOLPIDEM TARTRATE 5 MG PO TABS
5.0000 mg | ORAL_TABLET | Freq: Every evening | ORAL | Status: DC | PRN
Start: 2016-08-12 — End: 2016-08-14

## 2016-08-12 MED ORDER — SODIUM CHLORIDE 0.9 % IR SOLN
Status: DC | PRN
Start: 2016-08-12 — End: 2016-08-12
  Administered 2016-08-12: 1000 mL

## 2016-08-12 MED ORDER — EPHEDRINE SULFATE 50 MG/ML IJ SOLN
INTRAMUSCULAR | Status: DC | PRN
  Administered 2016-08-11: 5 mg via INTRAVENOUS

## 2016-08-12 NOTE — Op Note (Signed)
Pre-Operative Diagnosis: 1) 38+3 week intrauterine pregnancy 2) Suspected fetal macrosomia 3) A2 gestational diabetes mellitus with poor control 4) Fetal malpresentation 5) Labor Postoperative Diagnosis: 1) 38+3 week intrauterine pregnancy 2) Suspected fetal macrosomia 3) A2 gestational diabetes mellitus with poor control 4) Fetal malpresentation 5) Labor Procedure: Primary low transverse cesarean section Surgeon: Dr. Waynard ReedsKendra Ilia Dimaano Assistant: none Operative Findings: Female infant in the transverse back up lie with head to maternal left. Apgars of 8 at 1 minute and 9 at 5 minutes. Normal ovaries and tubes.  Specimen: Placenta to pathology EBL: Total I/O In: 2000 [I.V.:2000] Out: 1150 [Urine:350; Blood:800]   Procedure:Ms. Christell ConstantMoore is an 31 year old gravida 6 para 2032 at 5238 weeks and 3 days estimated gestational age who presents for cesarean section. The patient presented to maternity admissions and was determined to be in labor. The patient has poorly controlled gestational diabetes and was scheduled for primary cesarean section next week for an estimated fetal weight greater than 4500 grams. Additionally, fetal presentation was transverse lie. Following the appropriate informed consent the patient was brought to the operating room where spinal anesthesia was administered and found to be adequate. She was placed in the dorsal supine position with a leftward tilt. She was prepped and draped in the normal sterile fashion. The patient was appropriately identified in a pre-operative time out procedure.  Scalpel was then used to make a Pfannenstiel skin incision which was carried down to the underlying layers of soft tissue to the fascia. The fascia was incised in the midline and the fascial incision was extended laterally with Mayo scissors. The superior aspect of the fascial incision was grasped with Coker clamps x2, tented up and the rectus muscles dissected off sharply with the electrocautery unit area and the  same procedure was repeated on the inferior aspect of the fascial incision. The rectus muscles were separated in the midline. The abdominal peritoneum was identified, tented up, entered sharply, and the incision was extended superiorly and inferiorly with good visualization of the bladder. The Alexis retractor was then deployed. The vesicouterine peritoneum was identified, tented up, entered sharply, and the bladder flap was created digitally. Scalpel was then used to make a low transverse incision on the uterus which was extended laterally with blunt dissection. The fetal vertex was identified at the maternal left uterus and rotated to vertex. With the assistance of a vacuum extractor, the vertex was delivered through the uterine incision followed by the body. The infant was bulb suctioned on the operative field cried vigorously. After a 1 minute delay in cord clamping, the infant was passed to the waiting neonatal team. Placenta was then delivered spontaneously, the uterus was cleared of all clot and debris. The uterine incision was repaired with #1 chromic in running locked fashion followed by a second imbricating layer. Ovaries and tubes were inspected and normal. The Alexis retractor was removed. The abdominal peritoneum was reapproximated with 2-0 Vicryl in a running fashion, the rectus muscles was reapproximated with 2-0 chromic in a running fashion. The fascia was closed with a looped PDS in a running fashion. The skin was closed with 4-0 vicryl in a subcuticular fashion and Dermabond. All sponge lap and needle counts were correct x2. Patient tolerated the procedure well and recovered in stable condition following the procedure.

## 2016-08-12 NOTE — Transfer of Care (Signed)
Immediate Anesthesia Transfer of Care Note  Patient: Christy Jones  Procedure(s) Performed: Procedure(s): CESAREAN SECTION (N/A)  Patient Location: PACU  Anesthesia Type:Spinal  Level of Consciousness: awake, alert  and oriented  Airway & Oxygen Therapy: Patient Spontanous Breathing  Post-op Assessment: Report given to RN and Post -op Vital signs reviewed and stable  Post vital signs: Reviewed and stable  Last Vitals:  Vitals:   08/11/16 2249  BP: 119/83  Pulse: 78  Temp: 36.6 C    Last Pain:  Vitals:   08/11/16 2249  TempSrc: Oral  PainSc:          Complications: No apparent anesthesia complications

## 2016-08-12 NOTE — Anesthesia Procedure Notes (Signed)
Spinal  Patient location during procedure: OR Staffing Anesthesiologist: Nolon Nations Performed: anesthesiologist  Preanesthetic Checklist Completed: patient identified, site marked, surgical consent, pre-op evaluation, timeout performed, IV checked, risks and benefits discussed and monitors and equipment checked Spinal Block Patient position: sitting Prep: site prepped and draped and DuraPrep Patient monitoring: heart rate, continuous pulse ox and blood pressure Location: L3-4 Injection technique: single-shot Needle Needle type: Sprotte  Needle gauge: 24 G Needle length: 9 cm Assessment Sensory level: T6 Additional Notes Expiration date of kit checked and confirmed. Patient tolerated procedure well, without complications.

## 2016-08-12 NOTE — Progress Notes (Signed)
  Patient is eating, ambulating, voiding.  Pain control is good.  Vitals:   08/12/16 0325 08/12/16 0422 08/12/16 0452 08/12/16 0543  BP: 122/77 119/77  113/72  Pulse: (!) 58 60  61  Resp: 16 17  16   Temp: 97.7 F (36.5 C) 97.8 F (36.6 C)  97.8 F (36.6 C)  TempSrc: Oral Oral  Oral  SpO2: 100% 100%  100%  Weight:   83 kg (183 lb)     lungs:   clear to auscultation cor:    RRR Abdomen:  soft, appropriate tenderness, incisions intact and without erythema or exudate ex:    no cords   Lab Results  Component Value Date   WBC 11.4 (H) 08/12/2016   HGB 9.7 (L) 08/12/2016   HCT 26.7 (L) 08/12/2016   MCV 78.1 08/12/2016   PLT 102 (L) 08/12/2016    --/--/O POS (02/18 2245)/RI  A/P    Post operative day 1.  Routine post op and postpartum care.  Expect d/c routine.  Percocet for pain control.

## 2016-08-12 NOTE — Progress Notes (Signed)
Mother desires circ but baby not checked by MD yet and pt unsure if will do here or office.

## 2016-08-13 LAB — HEMOGLOBIN A1C
Hgb A1c MFr Bld: 5.2 % (ref 4.8–5.6)
MEAN PLASMA GLUCOSE: 103 mg/dL

## 2016-08-13 NOTE — Progress Notes (Signed)
  Patient is eating, ambulating, voiding.  Pain control is good.  Lochia is appropriate Reports poor sleep last night and minimal help here or at home  Vitals:   08/12/16 1330 08/12/16 1748 08/12/16 2157 08/13/16 0557  BP: 108/74 132/73 118/67 122/71  Pulse: 65 63 66 62  Resp: 18  18 18   Temp: 97.8 F (36.6 C) 97.5 F (36.4 C) 97.7 F (36.5 C) 98 F (36.7 C)  TempSrc: Oral  Oral Oral  SpO2:  100% 100%   Weight:        NAD Abdomen:  soft, appropriate tenderness, incisions intact and without erythema or exudate Ext--no LE edema  Lab Results  Component Value Date   WBC 11.4 (H) 08/12/2016   HGB 9.7 (L) 08/12/2016   HCT 26.7 (L) 08/12/2016   MCV 78.1 08/12/2016   PLT 102 (L) 08/12/2016    --/--/O POS (02/18 2245)/RI  A/P    [id number] POD#2 s/p primary CD for malpresentation / labor / suspected fetal macrosomia Routine post op and postpartum care.   Expect d/c tomorrow. GDM--questionable adherence to meds / BG monitoring (was prescribed metformin) A1c 5.2 on admission. Fasting 105 yesterday. Plan 2hr gtt 6wks PP  Desires circ as outpatient, will call to schedule

## 2016-08-13 NOTE — Progress Notes (Signed)
UR chart review completed.  

## 2016-08-13 NOTE — Plan of Care (Signed)
Problem: Bowel/Gastric: Goal: Gastrointestinal status will improve Outcome: Progressing Patient states that she hasn't passed any gas. She is up walking in the room but I encouraged her to also try walking in the hall. I gave her warm prune and apple juice and she is also getting simethicone.

## 2016-08-13 NOTE — Anesthesia Postprocedure Evaluation (Signed)
Anesthesia Post Note  Patient: Christy Jones  Procedure(s) Performed: Procedure(s) (LRB): CESAREAN SECTION (N/A)  Patient location during evaluation: PACU Anesthesia Type: Spinal Level of consciousness: awake and alert Pain management: pain level controlled Vital Signs Assessment: post-procedure vital signs reviewed and stable Respiratory status: spontaneous breathing and respiratory function stable Cardiovascular status: blood pressure returned to baseline and stable Postop Assessment: spinal receding Anesthetic complications: no       Last Vitals:  Vitals:   08/12/16 2157 08/13/16 0557  BP: 118/67 122/71  Pulse: 66 62  Resp: 18 18  Temp: 36.5 C 36.7 C    Last Pain:  Vitals:   08/13/16 0837  TempSrc:   PainSc: 0-No pain                 Lewie LoronJohn Hasnain Manheim

## 2016-08-14 MED ORDER — IBUPROFEN 600 MG PO TABS: 600.0000 mg | ORAL_TABLET | Freq: Four times a day (QID) | ORAL | 0 refills | Status: DC

## 2016-08-14 MED ORDER — OXYCODONE-ACETAMINOPHEN 5-325 MG PO TABS: 2.0000 | ORAL_TABLET | ORAL | 0 refills | Status: DC | PRN

## 2016-08-14 NOTE — Discharge Summary (Signed)
Obstetric Discharge Summary Reason for Admission: transverse lie Prenatal Procedures: NST and ultrasound Intrapartum Procedures: cesarean: low cervical, transverse Postpartum Procedures: none Complications-Operative and Postpartum: none Hemoglobin  Date Value Ref Range Status  08/12/2016 9.7 (L) 12.0 - 15.0 g/dL Final   HCT  Date Value Ref Range Status  08/12/2016 26.7 (L) 36.0 - 46.0 % Final    Physical Exam:  General: alert Lochia: appropriate Uterine Fundus: firm Incision: healing well DVT Evaluation: No evidence of DVT seen on physical exam.  Discharge Diagnoses: Term Pregnancy-delivered and GDM and transverse lie  Discharge Information: Date: 08/14/2016 Activity: pelvic rest Diet: routine Medications: PNV, Ibuprofen and Percocet Condition: stable Instructions: refer to practice specific booklet Discharge to: home Follow-up Information    Almon HerculesOSS,KENDRA H., MD. Schedule an appointment as soon as possible for a visit in 1 month(s).   Specialty:  Obstetrics and Gynecology Contact information: [address] [place] [id number] [phone]           Newborn Data: Live born female  Birth Weight: 10 lb 1.5 oz (4578 g) APGAR: 8, 9  Home with mother.  ANDERSON,MARK E 08/14/2016, 9:00 AM

## 2016-08-16 ENCOUNTER — Encounter (HOSPITAL_COMMUNITY): Admission: RE | Admit: 2016-08-16 | Payer: Medicaid Other | Source: Ambulatory Visit

## 2016-08-16 MED ORDER — DEXAMETHASONE SODIUM PHOSPHATE 10 MG/ML IJ SOLN
INTRAMUSCULAR | Status: AC
  Filled 2016-08-16: qty 1

## 2016-08-16 MED ORDER — LIDOCAINE HCL (CARDIAC) 20 MG/ML IV SOLN
INTRAVENOUS | Status: AC
  Filled 2016-08-16: qty 5

## 2016-08-16 MED ORDER — PROPOFOL 10 MG/ML IV BOLUS
INTRAVENOUS | Status: AC
  Filled 2016-08-16: qty 20

## 2016-08-16 MED ORDER — MIDAZOLAM HCL 2 MG/2ML IJ SOLN
INTRAMUSCULAR | Status: AC
  Filled 2016-08-16: qty 2

## 2016-08-16 MED ORDER — KETOROLAC TROMETHAMINE 30 MG/ML IJ SOLN
INTRAMUSCULAR | Status: AC
  Filled 2016-08-16: qty 1

## 2016-08-16 MED ORDER — FENTANYL CITRATE (PF) 100 MCG/2ML IJ SOLN
INTRAMUSCULAR | Status: AC
  Filled 2016-08-16: qty 2

## 2016-08-16 MED ORDER — SUCCINYLCHOLINE CHLORIDE 200 MG/10ML IV SOSY
PREFILLED_SYRINGE | INTRAVENOUS | Status: AC
  Filled 2016-08-16: qty 10

## 2016-08-16 MED ORDER — GLYCOPYRROLATE 0.2 MG/ML IJ SOLN
INTRAMUSCULAR | Status: AC
  Filled 2016-08-16: qty 1

## 2016-08-16 MED ORDER — ONDANSETRON HCL 4 MG/2ML IJ SOLN
INTRAMUSCULAR | Status: AC
  Filled 2016-08-16: qty 2

## 2016-08-19 ENCOUNTER — Inpatient Hospital Stay (HOSPITAL_COMMUNITY)
Admission: RE | Admit: 2016-08-19 | Payer: Medicaid Other | Source: Ambulatory Visit | Admitting: Obstetrics and Gynecology

## 2016-08-19 ENCOUNTER — Encounter (HOSPITAL_COMMUNITY): Admission: RE | Payer: Self-pay | Source: Ambulatory Visit

## 2016-08-19 SURGERY — Surgical Case
Anesthesia: Regional

## 2017-06-08 ENCOUNTER — Inpatient Hospital Stay (HOSPITAL_COMMUNITY)
Admission: AD | Admit: 2017-06-08 | Discharge: 2017-06-08 | Disposition: A | Discharge status: Home / Self Care | Payer: Medicaid Other | Source: Ambulatory Visit | Attending: Obstetrics and Gynecology | Admitting: Obstetrics and Gynecology

## 2017-06-08 ENCOUNTER — Other Ambulatory Visit: Payer: Self-pay

## 2017-06-08 ENCOUNTER — Inpatient Hospital Stay (HOSPITAL_COMMUNITY): Payer: Medicaid Other

## 2017-06-08 ENCOUNTER — Encounter (HOSPITAL_COMMUNITY): Payer: Self-pay | Admitting: *Deleted

## 2017-06-08 DIAGNOSIS — Z3A09 9 weeks gestation of pregnancy: Secondary | ICD-10-CM | POA: Insufficient documentation

## 2017-06-08 DIAGNOSIS — F17210 Nicotine dependence, cigarettes, uncomplicated: Secondary | ICD-10-CM | POA: Insufficient documentation

## 2017-06-08 DIAGNOSIS — Z6790 Unspecified blood type, Rh positive: Secondary | ICD-10-CM | POA: Diagnosis not present

## 2017-06-08 DIAGNOSIS — O4690 Antepartum hemorrhage, unspecified, unspecified trimester: Secondary | ICD-10-CM

## 2017-06-08 DIAGNOSIS — O4691 Antepartum hemorrhage, unspecified, first trimester: Secondary | ICD-10-CM

## 2017-06-08 DIAGNOSIS — R109 Unspecified abdominal pain: Secondary | ICD-10-CM | POA: Diagnosis present

## 2017-06-08 DIAGNOSIS — O3680X0 Pregnancy with inconclusive fetal viability, not applicable or unspecified: Secondary | ICD-10-CM

## 2017-06-08 DIAGNOSIS — R1011 Right upper quadrant pain: Secondary | ICD-10-CM | POA: Diagnosis not present

## 2017-06-08 DIAGNOSIS — O99331 Smoking (tobacco) complicating pregnancy, first trimester: Secondary | ICD-10-CM | POA: Diagnosis not present

## 2017-06-08 DIAGNOSIS — O209 Hemorrhage in early pregnancy, unspecified: Secondary | ICD-10-CM | POA: Diagnosis not present

## 2017-06-08 LAB — CBC
HCT: 40 % (ref 36.0–46.0)
Hemoglobin: 14.4 g/dL (ref 12.0–15.0)
MCH: 30.4 pg (ref 26.0–34.0)
MCHC: 36 g/dL (ref 30.0–36.0)
MCV: 84.4 fL (ref 78.0–100.0)
PLATELETS: 228 10*3/uL (ref 150–400)
RBC: 4.74 MIL/uL (ref 3.87–5.11)
RDW: 14 % (ref 11.5–15.5)
WBC: 11 10*3/uL — ABNORMAL HIGH (ref 4.0–10.5)

## 2017-06-08 LAB — URINALYSIS, ROUTINE W REFLEX MICROSCOPIC
BILIRUBIN URINE: NEGATIVE
GLUCOSE, UA: NEGATIVE mg/dL
Hgb urine dipstick: NEGATIVE
Ketones, ur: 5 mg/dL — AB
LEUKOCYTES UA: NEGATIVE
Nitrite: NEGATIVE
Protein, ur: 30 mg/dL — AB
SPECIFIC GRAVITY, URINE: 1.038 — AB (ref 1.005–1.030)
pH: 5 (ref 5.0–8.0)

## 2017-06-08 LAB — POCT PREGNANCY, URINE: Preg Test, Ur: POSITIVE — AB

## 2017-06-08 LAB — WET PREP, GENITAL
SPERM: NONE SEEN
Trich, Wet Prep: NONE SEEN
Yeast Wet Prep HPF POC: NONE SEEN

## 2017-06-08 LAB — HCG, QUANTITATIVE, PREGNANCY: hCG, Beta Chain, Quant, S: 365 m[IU]/mL — ABNORMAL HIGH (ref <5)

## 2017-06-08 NOTE — MAU Note (Signed)
Had positive upt yesterday. Cramping and brown spotting 11/3 and 11/16 which lasted couple days. More brown spotting today. LMP 04/01/17

## 2017-06-08 NOTE — Discharge Instructions (Signed)
Vaginal Bleeding During Pregnancy, First Trimester °A small amount of bleeding (spotting) from the vagina is common in early pregnancy. Sometimes the bleeding is normal and is not a problem, and sometimes it is a sign of something serious. Be sure to tell your doctor about any bleeding from your vagina right away. °Follow these instructions at home: °· Watch your condition for any changes. °· Follow your doctor's instructions about how active you can be. °· If you are on bed rest: °? You may need to stay in bed and only get up to use the bathroom. °? You may be allowed to do some activities. °? If you need help, make plans for someone to help you. °· Write down: °? The number of pads you use each day. °? How often you change pads. °? How soaked (saturated) your pads are. °· Do not use tampons. °· Do not douche. °· Do not have sex or orgasms until your doctor says it is okay. °· If you pass any tissue from your vagina, save the tissue so you can show it to your doctor. °· Only take medicines as told by your doctor. °· Do not take aspirin because it can make you bleed. °· Keep all follow-up visits as told by your doctor. °Contact a doctor if: °· You bleed from your vagina. °· You have cramps. °· You have labor pains. °· You have a fever that does not go away after you take medicine. °Get help right away if: °· You have very bad cramps in your back or belly (abdomen). °· You pass large clots or tissue from your vagina. °· You bleed more. °· You feel light-headed or weak. °· You pass out (faint). °· You have chills. °· You are leaking fluid or have a gush of fluid from your vagina. °· You pass out while pooping (having a bowel movement). °This information is not intended to replace advice given to you by your health care provider. Make sure you discuss any questions you have with your health care provider. °Document Released: 10/25/2013 Document Revised: 11/16/2015 Document Reviewed: 02/15/2013 °Elsevier Interactive  Patient Education © 2018 [employer] ° °

## 2017-06-08 NOTE — MAU Provider Note (Signed)
History     CSN: [id number]  Arrival date and time: 06/08/17 0140   First Provider Initiated Contact with Patient 06/08/17 0231      Chief Complaint  Patient presents with  . Abdominal Cramping  . Vaginal Discharge   [id number] @[redacted]w[redacted]d  by LMP here with spotting and abdominal pain. She's had 3 brief episodes of brown spotting (twice last month, once today). Abdominal pain started today. Pain is RUQ. Describes as throbbing. She didn't take anything for it and eventually resolved. She doesn't think she is this far along because she was using condoms until the end of October.    OB History    Gravida Para Term Preterm AB Living   7 3 3  0 3 2   SAB TAB Ectopic Multiple Live Births   1 1 1  0 1      Past Medical History:  Diagnosis Date  . Asthma   . Chlamydia   . Gestational diabetes   . Headache(784.0)   . Pregnancy induced hypertension    post- partem 2012  . Sickle cell trait (HCC)   . Trichomonas   . Umbilical hernia   . Urinary tract infection     Past Surgical History:  Procedure Laterality Date  . CESAREAN SECTION N/A 08/11/2016   Procedure: CESAREAN SECTION;  Surgeon: Waynard ReedsKendra Ross, MD;  [facility];  Service: Obstetrics;  Laterality: N/A;  . CHOLECYSTECTOMY  07/30/2012   Laproscopic     . CHOLECYSTECTOMY N/A 07/30/2012   Procedure: LAPAROSCOPIC CHOLECYSTECTOMY WITH INTRAOPERATIVE CHOLANGIOGRAM;  Surgeon: Ernestene MentionHaywood M Ingram, MD;  [facility];  Service: General;  Laterality: N/A;  . ECTOPIC PREGNANCY SURGERY    . EXPLORATORY LAPAROTOMY     removal of ectopic pregnancy    Family History  Problem Relation Age of Onset  . Asthma Maternal Grandmother   . Diabetes Maternal Grandmother   . Anesthesia problems Neg Hx     Social History   Tobacco Use  . Smoking status: Current Every Day Smoker    Packs/day: 0.25    Years: 10.00    Pack years: 2.50    Types: Cigarettes  . Smokeless tobacco: Never Used  Substance Use Topics  . Alcohol use: No  .  Drug use: No    Allergies:  Allergies  Allergen Reactions  . Lactose Intolerance (Gi) Nausea And Vomiting    Medications Prior to Admission  Medication Sig Dispense Refill Last Dose  . albuterol (PROVENTIL HFA;VENTOLIN HFA) 108 (90 BASE) MCG/ACT inhaler Inhale 2 puffs into the lungs 4 (four) times daily as needed (asthma).   More than a month at Unknown time  . ibuprofen (ADVIL,MOTRIN) 600 MG tablet Take 1 tablet (600 mg total) by mouth every 6 (six) hours. 30 tablet 0   . oxyCODONE-acetaminophen (PERCOCET/ROXICET) 5-325 MG tablet Take 2 tablets by mouth every 4 (four) hours as needed (pain scale > 7). 30 tablet 0   . Prenatal Vit-Fe Fumarate-FA (MULTIVITAMIN-PRENATAL) 27-0.8 MG TABS tablet Take 1 tablet by mouth daily at 12 noon.   Past Week at Unknown time    Review of Systems  Constitutional: Negative for chills and fever.  Gastrointestinal: Positive for abdominal pain. Negative for constipation, diarrhea, nausea and vomiting.  Genitourinary: Positive for vaginal bleeding. Negative for dysuria, frequency and urgency.   Physical Exam   Blood pressure 134/81, pulse 79, temperature 98.2 F (36.8 C), resp. rate 18, height 5\' 4"  (1.626 m), weight 182 lb (82.6 kg), last menstrual period 04/01/2017, unknown if  currently breastfeeding.  Physical Exam  Nursing note and vitals reviewed. Constitutional: She is oriented to person, place, and time. She appears well-developed and well-nourished.  HENT:  Head: Normocephalic and atraumatic.  Neck: Normal range of motion.  Cardiovascular: Normal rate.  Respiratory: Effort normal. No respiratory distress.  GI: Soft. She exhibits no distension and no mass. There is no tenderness. There is no rebound and no guarding.  Genitourinary:  Genitourinary Comments: External: no lesions or erythema Vagina: rugated, pink, moist, thick white clumpy discharge, no blood, cervix closed Uterus: + enlarged, anteverted, non tender, no CMT Adnexae: no masses,  no tenderness left, no tenderness right   Musculoskeletal: Normal range of motion.  Neurological: She is alert and oriented to person, place, and time.  Skin: Skin is warm and dry.  Psychiatric: She has a normal mood and affect.   Results for orders placed or performed during the hospital encounter of 06/08/17 (from the past 24 hour(s))  Urinalysis, Routine w reflex microscopic     Status: Abnormal   Collection Time: 06/08/17  2:20 AM  Result Value Ref Range   Color, Urine AMBER (A) YELLOW   APPearance CLOUDY (A) CLEAR   Specific Gravity, Urine 1.038 (H) 1.005 - 1.030   pH 5.0 5.0 - 8.0   Glucose, UA NEGATIVE NEGATIVE mg/dL   Hgb urine dipstick NEGATIVE NEGATIVE   Bilirubin Urine NEGATIVE NEGATIVE   Ketones, ur 5 (A) NEGATIVE mg/dL   Protein, ur 30 (A) NEGATIVE mg/dL   Nitrite NEGATIVE NEGATIVE   Leukocytes, UA NEGATIVE NEGATIVE   RBC / HPF 6-30 0 - 5 RBC/hpf   WBC, UA 0-5 0 - 5 WBC/hpf   Bacteria, UA RARE (A) NONE SEEN   Squamous Epithelial / LPF 6-30 (A) NONE SEEN   Mucus PRESENT    Ca Oxalate Crys, UA PRESENT   Pregnancy, urine POC     Status: Abnormal   Collection Time: 06/08/17  2:27 AM  Result Value Ref Range   Preg Test, Ur POSITIVE (A) NEGATIVE  Wet prep, genital     Status: Abnormal   Collection Time: 06/08/17  2:35 AM  Result Value Ref Range   Yeast Wet Prep HPF POC NONE SEEN NONE SEEN   Trich, Wet Prep NONE SEEN NONE SEEN   Clue Cells Wet Prep HPF POC PRESENT (A) NONE SEEN   WBC, Wet Prep HPF POC MODERATE (A) NONE SEEN   Sperm NONE SEEN   CBC     Status: Abnormal   Collection Time: 06/08/17  2:37 AM  Result Value Ref Range   WBC 11.0 (H) 4.0 - 10.5 K/uL   RBC 4.74 3.87 - 5.11 MIL/uL   Hemoglobin 14.4 12.0 - 15.0 g/dL   HCT 16.140.0 09.636.0 - 04.546.0 %   MCV 84.4 78.0 - 100.0 fL   MCH 30.4 26.0 - 34.0 pg   MCHC 36.0 30.0 - 36.0 g/dL   RDW 40.914.0 81.111.5 - 91.415.5 %   Platelets 228 150 - 400 K/uL  hCG, quantitative, pregnancy     Status: Abnormal   Collection Time:  06/08/17  2:37 AM  Result Value Ref Range   hCG, Beta Chain, Quant, S 365 (H) <5 mIU/mL   [place] Ob Less Than 14 Weeks With Ob Transvaginal  Result Date: 06/08/2017 CLINICAL DATA:  Pregnant patient in first-trimester pregnancy with vaginal bleeding. Beta HCG 365 EXAM: OBSTETRIC <14 WK US AND TRANSVAGINAL OB US TECHNIQUE: Both transabdominal and transvaginal ultrasound examinations were performed for complete evaluation of the  gestation as well as the maternal uterus, adnexal regions, and pelvic cul-de-sac. Transvaginal technique was performed to assess early pregnancy. COMPARISON:  None this pregnancy. FINDINGS: Intrauterine gestational sac: None Yolk sac:  Not Visualized. Embryo:  Not Visualized. Cardiac Activity: Not Visualized. Maternal uterus/adnexae: The uterus is retroverted. Endometrium measures approximately 10 mm. Left ovary is normal measuring 2.6 x 3.7 x 1.4 cm. Right ovary measures 4.0 x 2.8 x 2.7 cm and contains a small complex cyst with peripheral vascularity, likely a corpus luteum. Trace free fluid in the pelvic cul-de-sac. IMPRESSION: No intrauterine pregnancy or findings suspicious for ectopic pregnancy. Findings are consistent with pregnancy of unknown location and may reflect early intrauterine pregnancy not yet visualized sonographically, occult ectopic pregnancy, or failed pregnancy. Recommend trending of beta HCG and follow-up ultrasound in 10-14 days. Electronically Signed   By: Rubye Oaks M.D.   On: 06/08/2017 03:24   MAU Course  Procedures  MDM Labs and [place] ordered and reviewed. No IUP or adnexal mass seen on [place]. Likely early pregnancy but cannot exclude failed pregnancy or ectopic. Will follow quant in 48 hrs. Stable for discharge home.  Assessment and Plan   1. Pregnancy of unknown anatomic location   2. Vaginal bleeding during pregnancy   3. Blood type, Rh positive    Discharge home Follow up in [facility] on 06/10/17 @11 :00 for labs Ectopic/return  precautions  Allergies as of 06/08/2017      Reactions   Lactose Intolerance (gi) Nausea And Vomiting      Medication List    STOP taking these medications   ibuprofen 600 MG tablet Commonly known as:  ADVIL,MOTRIN   oxyCODONE-acetaminophen 5-325 MG tablet Commonly known as:  PERCOCET/ROXICET     TAKE these medications   albuterol 108 (90 Base) MCG/ACT inhaler Commonly known as:  PROVENTIL HFA;VENTOLIN HFA Inhale 2 puffs into the lungs 4 (four) times daily as needed (asthma).   multivitamin-prenatal 27-0.8 MG Tabs tablet Take 1 tablet by mouth daily at 12 noon.      Donette Larry, CNM 06/08/2017, 3:36 AM

## 2017-06-09 LAB — GC/CHLAMYDIA PROBE AMP (~~LOC~~) NOT AT ARMC
CHLAMYDIA, DNA PROBE: NEGATIVE
NEISSERIA GONORRHEA: NEGATIVE

## 2017-06-10 ENCOUNTER — Ambulatory Visit: Payer: Self-pay | Admitting: General Practice

## 2017-06-10 ENCOUNTER — Encounter: Payer: Self-pay | Admitting: General Practice

## 2017-06-10 DIAGNOSIS — O3680X0 Pregnancy with inconclusive fetal viability, not applicable or unspecified: Secondary | ICD-10-CM

## 2017-06-10 LAB — HCG, QUANTITATIVE, PREGNANCY: HCG, BETA CHAIN, QUANT, S: 1102 m[IU]/mL — AB (ref <5)

## 2017-06-10 NOTE — Addendum Note (Signed)
Addended by: Kathee DeltonHILLMAN, Tywana Robotham L on: 06/10/2017 02:59 PM   Modules accepted: Level of Service

## 2017-06-10 NOTE — Progress Notes (Signed)
Patient here for stat bhcg today. Patient denies bleeding but reports cramping on her left side rated at a 7 at times. Discussed with patient having her wait in lobby for results & updated plan of care. Patient verbalized understanding & has no questions at this time.  Reviewed patient's labs with Sharen CounterLisa Leftwich Kirby who agrees with favorable rise in bhcg levels- patient should have follow up ultrasound in 10 days. Scheduled u/s for 12/28 @ 8am. Informed patient of results & ultrasound appt. Patient verbalized understanding to all & had no questions                        ++++++

## 2017-06-11 NOTE — Progress Notes (Signed)
Reviewed pt labwork with RN at time of visit.  Pain is intermittent, not occurring at time of her office visit on 06/10/17, and has not required any treatment.  With appropriate rise in hcg, pt can follow up with outpatient  US in 10 days.  Pt was counseled by RN to come to MAU with any increase in pain or onset of bleeding.

## 2017-06-20 ENCOUNTER — Other Ambulatory Visit: Payer: Self-pay

## 2017-06-20 ENCOUNTER — Ambulatory Visit (HOSPITAL_COMMUNITY)
Admission: RE | Admit: 2017-06-20 | Discharge: 2017-06-20 | Disposition: A | Discharge status: Home / Self Care | Payer: Medicaid Other | Source: Ambulatory Visit | Attending: Advanced Practice Midwife | Admitting: Advanced Practice Midwife

## 2017-06-20 ENCOUNTER — Encounter (HOSPITAL_COMMUNITY)
Admission: AD | Disposition: A | Discharge status: Home / Self Care | Payer: Self-pay | Source: Ambulatory Visit | Attending: Obstetrics & Gynecology

## 2017-06-20 ENCOUNTER — Encounter (HOSPITAL_COMMUNITY): Payer: Self-pay | Admitting: Certified Registered Nurse Anesthetist

## 2017-06-20 ENCOUNTER — Ambulatory Visit: Payer: Self-pay | Admitting: *Deleted

## 2017-06-20 ENCOUNTER — Ambulatory Visit (HOSPITAL_COMMUNITY): Payer: Medicaid Other

## 2017-06-20 ENCOUNTER — Ambulatory Visit (HOSPITAL_COMMUNITY): Payer: Medicaid Other | Admitting: Anesthesiology

## 2017-06-20 ENCOUNTER — Observation Stay (HOSPITAL_COMMUNITY)
Admission: AD | Admit: 2017-06-20 | Discharge: 2017-06-21 | Disposition: A | Discharge status: Home / Self Care | Payer: Medicaid Other | Source: Ambulatory Visit | Attending: Obstetrics & Gynecology | Admitting: Obstetrics & Gynecology

## 2017-06-20 DIAGNOSIS — D573 Sickle-cell trait: Secondary | ICD-10-CM | POA: Diagnosis not present

## 2017-06-20 DIAGNOSIS — O3680X0 Pregnancy with inconclusive fetal viability, not applicable or unspecified: Secondary | ICD-10-CM

## 2017-06-20 DIAGNOSIS — F17210 Nicotine dependence, cigarettes, uncomplicated: Secondary | ICD-10-CM | POA: Insufficient documentation

## 2017-06-20 DIAGNOSIS — L7632 Postprocedural hematoma of skin and subcutaneous tissue following other procedure: Secondary | ICD-10-CM

## 2017-06-20 DIAGNOSIS — Z79899 Other long term (current) drug therapy: Secondary | ICD-10-CM | POA: Diagnosis not present

## 2017-06-20 DIAGNOSIS — Z3A00 Weeks of gestation of pregnancy not specified: Secondary | ICD-10-CM

## 2017-06-20 DIAGNOSIS — J45909 Unspecified asthma, uncomplicated: Secondary | ICD-10-CM | POA: Diagnosis not present

## 2017-06-20 DIAGNOSIS — G8918 Other acute postprocedural pain: Secondary | ICD-10-CM | POA: Diagnosis present

## 2017-06-20 DIAGNOSIS — O26899 Other specified pregnancy related conditions, unspecified trimester: Secondary | ICD-10-CM

## 2017-06-20 DIAGNOSIS — O00101 Right tubal pregnancy without intrauterine pregnancy: Secondary | ICD-10-CM | POA: Diagnosis not present

## 2017-06-20 DIAGNOSIS — O009 Unspecified ectopic pregnancy without intrauterine pregnancy: Secondary | ICD-10-CM

## 2017-06-20 DIAGNOSIS — Z9889 Other specified postprocedural states: Secondary | ICD-10-CM

## 2017-06-20 DIAGNOSIS — N854 Malposition of uterus: Secondary | ICD-10-CM | POA: Insufficient documentation

## 2017-06-20 HISTORY — PX: DIAGNOSTIC LAPAROSCOPY WITH REMOVAL OF ECTOPIC PREGNANCY: SHX6449

## 2017-06-20 LAB — CBC
HCT: 36.6 % (ref 36.0–46.0)
HEMATOCRIT: 39.9 % (ref 36.0–46.0)
HEMOGLOBIN: 14.8 g/dL (ref 12.0–15.0)
Hemoglobin: 13.6 g/dL (ref 12.0–15.0)
MCH: 29.8 pg (ref 26.0–34.0)
MCH: 30.3 pg (ref 26.0–34.0)
MCHC: 37.1 g/dL — ABNORMAL HIGH (ref 30.0–36.0)
MCHC: 37.3 g/dL — AB (ref 30.0–36.0)
MCV: 80.4 fL (ref 78.0–100.0)
MCV: 81.5 fL (ref 78.0–100.0)
Platelets: 221 10*3/uL (ref 150–400)
Platelets: 204 10*3/uL (ref 150–400)
RBC: 4.96 MIL/uL (ref 3.87–5.11)
RBC: 4.49 MIL/uL (ref 3.87–5.11)
RDW: 13.8 % (ref 11.5–15.5)
RDW: 13.8 % (ref 11.5–15.5)
WBC: 9.5 10*3/uL (ref 4.0–10.5)
WBC: 15 10*3/uL — ABNORMAL HIGH (ref 4.0–10.5)

## 2017-06-20 LAB — BASIC METABOLIC PANEL
Anion gap: 9 (ref 5–15)
BUN: 9 mg/dL (ref 6–20)
CALCIUM: 8.7 mg/dL — AB (ref 8.9–10.3)
CO2: 20 mmol/L — AB (ref 22–32)
CREATININE: 0.68 mg/dL (ref 0.44–1.00)
Chloride: 106 mmol/L (ref 101–111)
GFR calc non Af Amer: >60 mL/min (ref >60)
Glucose, Bld: 137 mg/dL — ABNORMAL HIGH (ref 65–99)
Potassium: 4 mmol/L (ref 3.5–5.1)
SODIUM: 135 mmol/L (ref 135–145)

## 2017-06-20 LAB — TYPE AND SCREEN
ABO/RH(D): O POS
Antibody Screen: NEGATIVE

## 2017-06-20 SURGERY — LAPAROSCOPY, WITH ECTOPIC PREGNANCY SURGICAL TREATMENT
Anesthesia: General

## 2017-06-20 MED ORDER — PHENYLEPHRINE 40 MCG/ML (10ML) SYRINGE FOR IV PUSH (FOR BLOOD PRESSURE SUPPORT)
PREFILLED_SYRINGE | INTRAVENOUS | Status: AC
  Filled 2017-06-20: qty 10

## 2017-06-20 MED ORDER — KETOROLAC TROMETHAMINE 30 MG/ML IJ SOLN: 30.0000 mg | Freq: Three times a day (TID) | INTRAMUSCULAR | Status: DC

## 2017-06-20 MED ORDER — OXYCODONE HCL 5 MG PO TABS
5.0000 mg | ORAL_TABLET | Freq: Once | ORAL | Status: AC
  Administered 2017-06-20: 5 mg via ORAL

## 2017-06-20 MED ORDER — IBUPROFEN 800 MG PO TABS: 800.0000 mg | ORAL_TABLET | Freq: Three times a day (TID) | ORAL | 0 refills | Status: DC | PRN

## 2017-06-20 MED ORDER — BUPIVACAINE HCL (PF) 0.25 % IJ SOLN
INTRAMUSCULAR | Status: AC
  Filled 2017-06-20: qty 30

## 2017-06-20 MED ORDER — SCOPOLAMINE 1 MG/3DAYS TD PT72
MEDICATED_PATCH | TRANSDERMAL | Status: AC
  Administered 2017-06-20: 1.5 mg via TRANSDERMAL
  Filled 2017-06-20: qty 1

## 2017-06-20 MED ORDER — LACTATED RINGERS IR SOLN
Status: DC | PRN
  Administered 2017-06-20: 1000 mL

## 2017-06-20 MED ORDER — LACTATED RINGERS IV SOLN: INTRAVENOUS | Status: DC

## 2017-06-20 MED ORDER — KETOROLAC TROMETHAMINE 30 MG/ML IJ SOLN
INTRAMUSCULAR | Status: DC | PRN
  Administered 2017-06-20: 30 mg via INTRAVENOUS

## 2017-06-20 MED ORDER — ONDANSETRON HCL 4 MG/2ML IJ SOLN
INTRAMUSCULAR | Status: AC
  Filled 2017-06-20: qty 2

## 2017-06-20 MED ORDER — DEXAMETHASONE SODIUM PHOSPHATE 4 MG/ML IJ SOLN
INTRAMUSCULAR | Status: AC
  Filled 2017-06-20: qty 1

## 2017-06-20 MED ORDER — SCOPOLAMINE 1 MG/3DAYS TD PT72
1.0000 | MEDICATED_PATCH | Freq: Once | TRANSDERMAL | Status: DC
  Administered 2017-06-20: 1.5 mg via TRANSDERMAL

## 2017-06-20 MED ORDER — FENTANYL CITRATE (PF) 100 MCG/2ML IJ SOLN
25.0000 ug | INTRAMUSCULAR | Status: DC | PRN
Start: 2017-06-20 — End: 2017-06-20
  Administered 2017-06-20 (×2): 50 ug via INTRAVENOUS

## 2017-06-20 MED ORDER — ROCURONIUM BROMIDE 100 MG/10ML IV SOLN
INTRAVENOUS | Status: DC | PRN
  Administered 2017-06-20: 10 mg via INTRAVENOUS
  Administered 2017-06-20: 5 mg via INTRAVENOUS
  Administered 2017-06-20: 20 mg via INTRAVENOUS

## 2017-06-20 MED ORDER — ONDANSETRON HCL 4 MG/2ML IJ SOLN
INTRAMUSCULAR | Status: DC | PRN
  Administered 2017-06-20: 4 mg via INTRAVENOUS

## 2017-06-20 MED ORDER — PROPOFOL 10 MG/ML IV BOLUS
INTRAVENOUS | Status: AC
  Filled 2017-06-20: qty 20

## 2017-06-20 MED ORDER — ZOLPIDEM TARTRATE 5 MG PO TABS: 5.0000 mg | ORAL_TABLET | Freq: Every evening | ORAL | Status: DC | PRN

## 2017-06-20 MED ORDER — OXYCODONE HCL 5 MG PO TABS
ORAL_TABLET | ORAL | Status: AC
  Filled 2017-06-20: qty 1

## 2017-06-20 MED ORDER — LACTATED RINGERS IV SOLN
INTRAVENOUS | Status: DC
  Administered 2017-06-20 (×3): via INTRAVENOUS

## 2017-06-20 MED ORDER — SUCCINYLCHOLINE CHLORIDE 20 MG/ML IJ SOLN
INTRAMUSCULAR | Status: DC | PRN
  Administered 2017-06-20: 100 mg via INTRAVENOUS

## 2017-06-20 MED ORDER — MIDAZOLAM HCL 2 MG/2ML IJ SOLN
INTRAMUSCULAR | Status: AC
  Filled 2017-06-20: qty 2

## 2017-06-20 MED ORDER — OXYCODONE-ACETAMINOPHEN 5-325 MG PO TABS
1.0000 | ORAL_TABLET | ORAL | Status: DC | PRN
  Administered 2017-06-20 – 2017-06-21 (×4): 1 via ORAL
  Filled 2017-06-20 (×4): qty 1

## 2017-06-20 MED ORDER — KETOROLAC TROMETHAMINE 30 MG/ML IJ SOLN
INTRAMUSCULAR | Status: AC
  Filled 2017-06-20: qty 1

## 2017-06-20 MED ORDER — BUPIVACAINE HCL (PF) 0.5 % IJ SOLN
INTRAMUSCULAR | Status: AC
  Filled 2017-06-20: qty 30

## 2017-06-20 MED ORDER — ACETAMINOPHEN 500 MG PO TABS
ORAL_TABLET | ORAL | Status: AC
Start: 2017-06-20 — End: 2017-06-20
  Administered 2017-06-20: 1000 mg via ORAL
  Filled 2017-06-20: qty 2

## 2017-06-20 MED ORDER — ONDANSETRON HCL 4 MG/2ML IJ SOLN: 4.0000 mg | Freq: Four times a day (QID) | INTRAMUSCULAR | Status: DC | PRN

## 2017-06-20 MED ORDER — FENTANYL CITRATE (PF) 100 MCG/2ML IJ SOLN
INTRAMUSCULAR | Status: AC
  Filled 2017-06-20: qty 2

## 2017-06-20 MED ORDER — BUPIVACAINE HCL (PF) 0.5 % IJ SOLN
INTRAMUSCULAR | Status: DC | PRN
  Administered 2017-06-20: 30 mL

## 2017-06-20 MED ORDER — DOCUSATE SODIUM 100 MG PO CAPS
100.0000 mg | ORAL_CAPSULE | Freq: Two times a day (BID) | ORAL | Status: DC
  Administered 2017-06-20 – 2017-06-21 (×2): 100 mg via ORAL
  Filled 2017-06-20 (×2): qty 1

## 2017-06-20 MED ORDER — SUGAMMADEX SODIUM 200 MG/2ML IV SOLN
INTRAVENOUS | Status: DC | PRN
  Administered 2017-06-20: 170 mg via INTRAVENOUS

## 2017-06-20 MED ORDER — ROCURONIUM BROMIDE 100 MG/10ML IV SOLN
INTRAVENOUS | Status: AC
  Filled 2017-06-20: qty 1

## 2017-06-20 MED ORDER — FENTANYL CITRATE (PF) 100 MCG/2ML IJ SOLN
INTRAMUSCULAR | Status: DC | PRN
  Administered 2017-06-20: 100 ug via INTRAVENOUS
  Administered 2017-06-20 (×2): 50 ug via INTRAVENOUS
  Administered 2017-06-20: 100 ug via INTRAVENOUS
  Administered 2017-06-20: 50 ug via INTRAVENOUS

## 2017-06-20 MED ORDER — LIDOCAINE HCL (CARDIAC) 20 MG/ML IV SOLN
INTRAVENOUS | Status: DC | PRN
  Administered 2017-06-20: 80 mg via INTRAVENOUS

## 2017-06-20 MED ORDER — SUCCINYLCHOLINE CHLORIDE 200 MG/10ML IV SOSY
PREFILLED_SYRINGE | INTRAVENOUS | Status: AC
  Filled 2017-06-20: qty 10

## 2017-06-20 MED ORDER — FENTANYL CITRATE (PF) 100 MCG/2ML IJ SOLN
INTRAMUSCULAR | Status: AC
Start: 2017-06-20 — End: 2017-06-20
  Administered 2017-06-20: 50 ug via INTRAVENOUS
  Filled 2017-06-20: qty 2

## 2017-06-20 MED ORDER — PROMETHAZINE HCL 25 MG/ML IJ SOLN: 6.2500 mg | INTRAMUSCULAR | Status: DC | PRN

## 2017-06-20 MED ORDER — MIDAZOLAM HCL 2 MG/2ML IJ SOLN
INTRAMUSCULAR | Status: DC | PRN
  Administered 2017-06-20 (×2): 1 mg via INTRAVENOUS

## 2017-06-20 MED ORDER — HYDROMORPHONE HCL 1 MG/ML IJ SOLN: 0.2000 mg | INTRAMUSCULAR | Status: DC | PRN

## 2017-06-20 MED ORDER — SUGAMMADEX SODIUM 200 MG/2ML IV SOLN
INTRAVENOUS | Status: AC
Start: 2017-06-20 — End: 2017-06-20
  Filled 2017-06-20: qty 2

## 2017-06-20 MED ORDER — SODIUM CHLORIDE 0.9 % IR SOLN
Status: DC | PRN
  Administered 2017-06-20: 1000 mL

## 2017-06-20 MED ORDER — HYDROCODONE-ACETAMINOPHEN 5-325 MG PO TABS: 1.0000 | ORAL_TABLET | Freq: Four times a day (QID) | ORAL | 0 refills | Status: DC | PRN

## 2017-06-20 MED ORDER — LIDOCAINE HCL (CARDIAC) 20 MG/ML IV SOLN
INTRAVENOUS | Status: AC
  Filled 2017-06-20: qty 5

## 2017-06-20 MED ORDER — PHENYLEPHRINE HCL 10 MG/ML IJ SOLN
INTRAMUSCULAR | Status: DC | PRN
  Administered 2017-06-20 (×4): 80 ug via INTRAVENOUS

## 2017-06-20 MED ORDER — SODIUM CHLORIDE 0.9 % IJ SOLN
INTRAMUSCULAR | Status: AC
  Filled 2017-06-20: qty 10

## 2017-06-20 MED ORDER — FENTANYL CITRATE (PF) 250 MCG/5ML IJ SOLN
INTRAMUSCULAR | Status: AC
  Filled 2017-06-20: qty 5

## 2017-06-20 MED ORDER — DEXAMETHASONE SODIUM PHOSPHATE 4 MG/ML IJ SOLN
INTRAMUSCULAR | Status: DC | PRN
  Administered 2017-06-20: 4 mg via INTRAVENOUS

## 2017-06-20 MED ORDER — PROPOFOL 10 MG/ML IV BOLUS
INTRAVENOUS | Status: DC | PRN
  Administered 2017-06-20: 150 mg via INTRAVENOUS

## 2017-06-20 MED ORDER — ACETAMINOPHEN 500 MG PO TABS
1000.0000 mg | ORAL_TABLET | Freq: Once | ORAL | Status: AC
  Administered 2017-06-20: 1000 mg via ORAL

## 2017-06-20 MED ORDER — KETOROLAC TROMETHAMINE 30 MG/ML IJ SOLN
30.0000 mg | Freq: Three times a day (TID) | INTRAMUSCULAR | Status: DC
  Administered 2017-06-20: 30 mg via INTRAVENOUS
  Filled 2017-06-20 (×2): qty 1

## 2017-06-20 MED ORDER — IBUPROFEN 600 MG PO TABS: 600.0000 mg | ORAL_TABLET | Freq: Four times a day (QID) | ORAL | Status: DC | PRN

## 2017-06-20 MED ORDER — ONDANSETRON HCL 4 MG PO TABS: 4.0000 mg | ORAL_TABLET | Freq: Four times a day (QID) | ORAL | Status: DC | PRN

## 2017-06-20 SURGICAL SUPPLY — 36 items
CABLE HIGH FREQUENCY MONO STRZ (ELECTRODE) ×3 IMPLANT
CATH ROBINSON RED A/P 16FR (CATHETERS) IMPLANT
DERMABOND ADHESIVE PROPEN (GAUZE/BANDAGES/DRESSINGS) ×2
DERMABOND ADVANCED .7 DNX6 (GAUZE/BANDAGES/DRESSINGS) ×1 IMPLANT
DRSG OPSITE POSTOP 3X4 (GAUZE/BANDAGES/DRESSINGS) IMPLANT
DURAPREP 26ML APPLICATOR (WOUND CARE) ×3 IMPLANT
GLOVE BIO SURGEON STRL SZ7 (GLOVE) ×3 IMPLANT
GLOVE BIOGEL PI IND STRL 6.5 (GLOVE) ×1 IMPLANT
GLOVE BIOGEL PI IND STRL 7.0 (GLOVE) ×4 IMPLANT
GLOVE BIOGEL PI INDICATOR 6.5 (GLOVE) ×2
GLOVE BIOGEL PI INDICATOR 7.0 (GLOVE) ×8
GLOVE ECLIPSE 6.5 STRL STRAW (GLOVE) ×3 IMPLANT
GOWN STRL REUS W/TWL LRG LVL3 (GOWN DISPOSABLE) ×6 IMPLANT
GOWN STRL REUS W/TWL XL LVL3 (GOWN DISPOSABLE) ×3 IMPLANT
MANIPULATOR UTERINE 4.5 ZUMI (MISCELLANEOUS) ×3 IMPLANT
NEEDLE INSUFFLATION 120MM (ENDOMECHANICALS) ×3 IMPLANT
NS IRRIG 1000ML POUR BTL (IV SOLUTION) ×3 IMPLANT
PACK LAPAROSCOPY BASIN (CUSTOM PROCEDURE TRAY) ×3 IMPLANT
PACK TRENDGUARD 450 HYBRID PRO (MISCELLANEOUS) IMPLANT
PACK TRENDGUARD 600 HYBRD PROC (MISCELLANEOUS) IMPLANT
POUCH SPECIMEN RETRIEVAL 10MM (ENDOMECHANICALS) ×3 IMPLANT
PROTECTOR NERVE ULNAR (MISCELLANEOUS) ×6 IMPLANT
SET IRRIG TUBING LAPAROSCOPIC (IRRIGATION / IRRIGATOR) ×3 IMPLANT
SHEARS HARMONIC ACE PLUS 36CM (ENDOMECHANICALS) ×3 IMPLANT
SLEEVE XCEL OPT CAN 5 100 (ENDOMECHANICALS) ×3 IMPLANT
SUT VIC AB 3-0 X1 27 (SUTURE) ×3 IMPLANT
SUT VICRYL 0 UR6 27IN ABS (SUTURE) ×6 IMPLANT
SUT VICRYL 4-0 PS2 18IN ABS (SUTURE) ×3 IMPLANT
SYR 10ML LL (SYRINGE) ×3 IMPLANT
TOWEL OR 17X24 6PK STRL BLUE (TOWEL DISPOSABLE) ×6 IMPLANT
TRAY FOLEY CATH SILVER 14FR (SET/KITS/TRAYS/PACK) ×3 IMPLANT
TRENDGUARD 450 HYBRID PRO PACK (MISCELLANEOUS)
TRENDGUARD 600 HYBRID PROC PK (MISCELLANEOUS)
TROCAR OPTI TIP 5M 100M (ENDOMECHANICALS) ×3 IMPLANT
TROCAR XCEL DIL TIP R 11M (ENDOMECHANICALS) ×3 IMPLANT
TROCAR XCEL NON-BLD 5MMX100MML (ENDOMECHANICALS) ×6 IMPLANT

## 2017-06-20 NOTE — Anesthesia Preprocedure Evaluation (Addendum)
Anesthesia Evaluation  Patient identified by MRN, date of birth, ID band Patient awake    Reviewed: Allergy & Precautions, NPO status , Patient's Chart, lab work & pertinent test results  Airway Mallampati: II  TM Distance: >3 FB Neck ROM: Full    Dental  (+) Teeth Intact, Dental Advisory Given, Chipped   Pulmonary asthma , Current Smoker (Smoked on DOS),    Pulmonary exam normal breath sounds clear to auscultation       Cardiovascular hypertension, Normal cardiovascular exam Rhythm:Regular Rate:Normal     Neuro/Psych  Headaches,    GI/Hepatic negative GI ROS, Neg liver ROS,   Endo/Other  Obesity History of GDM with prior pregnancy   Renal/GU negative Renal ROS     Musculoskeletal negative musculoskeletal ROS (+)   Abdominal   Peds  Hematology  (+) Blood dyscrasia, Sickle cell trait ,   Anesthesia Other Findings Day of surgery medications reviewed with the patient.  Reproductive/Obstetrics (+) Pregnancy Ectopic pregnancy                             Anesthesia Physical Anesthesia Plan  ASA: II and emergent  Anesthesia Plan: General   Post-op Pain Management:    Induction: Intravenous and Rapid sequence  PONV Risk Score and Plan: 2 and Dexamethasone and Ondansetron  Airway Management Planned: Oral ETT  Additional Equipment:   Intra-op Plan:   Post-operative Plan: Extubation in OR  Informed Consent: I have reviewed the patients History and Physical, chart, labs and discussed the procedure including the risks, benefits and alternatives for the proposed anesthesia with the patient or authorized representative who has indicated his/her understanding and acceptance.   Dental advisory given  Plan Discussed with: CRNA  Anesthesia Plan Comments: (Risks/benefits of general anesthesia discussed with patient including risk of damage to teeth, lips, gum, and tongue, nausea/vomiting,  allergic reactions to medications, and the possibility of heart attack, stroke and death.  All patient questions answered.  Patient wishes to proceed.)        Anesthesia Quick Evaluation

## 2017-06-20 NOTE — Anesthesia Procedure Notes (Signed)
Procedure Name: Intubation Date/Time: 06/20/2017 10:56 AM Performed by: Raenette Rover, CRNA Pre-anesthesia Checklist: Patient identified, Emergency Drugs available, Suction available and Patient being monitored Patient Re-evaluated:Patient Re-evaluated prior to induction Oxygen Delivery Method: Circle system utilized Preoxygenation: Pre-oxygenation with 100% oxygen Induction Type: IV induction, Rapid sequence and Cricoid Pressure applied Laryngoscope Size: Mac and 3 Grade View: Grade III Tube type: Oral Tube size: 7.0 mm Number of attempts: 1 Airway Equipment and Method: Stylet Placement Confirmation: positive ETCO2,  breath sounds checked- equal and bilateral and CO2 detector Secured at: 22 cm Tube secured with: Tape Dental Injury: Teeth and Oropharynx as per pre-operative assessment

## 2017-06-20 NOTE — Transfer of Care (Signed)
Immediate Anesthesia Transfer of Care Note  Patient: Christy Jones  Procedure(s) Performed: DIAGNOSTIC LAPAROSCOPY WITH REMOVAL OF ECTOPIC PREGNANCY (N/A )  Patient Location: PACU  Anesthesia Type:General  Level of Consciousness: awake, alert , oriented and patient cooperative  Airway & Oxygen Therapy: Patient Spontanous Breathing and Patient connected to nasal cannula oxygen  Post-op Assessment: Report given to RN and Post -op Vital signs reviewed and stable  Post vital signs: Reviewed and stable  Last Vitals:  Vitals:   06/20/17 1001  BP: (!) 129/94  Pulse: 74  Resp: 16  Temp: 37.1 C  SpO2: 99%    Last Pain:  Vitals:   06/20/17 1001  TempSrc: Oral      Patients Stated Pain Goal: 4 (06/20/17 1001)  Complications: No apparent anesthesia complications

## 2017-06-20 NOTE — Brief Op Note (Signed)
06/20/2017  12:56 PM  PATIENT:  Christy Jones  31 y.o. female  PRE-OPERATIVE DIAGNOSIS:  Ectopic Pregnancy  POST-OPERATIVE DIAGNOSIS:  Ectopic Pregnancy  PROCEDURE:  Procedure(s): DIAGNOSTIC LAPAROSCOPY WITH REMOVAL OF ECTOPIC PREGNANCY (N/A)  SURGEON:  Surgeon(s) and Role:    * Willodean RosenthalHarraway-Smith, Maddalynn Barnard, MD - Primary  ASSISTANTS: Dr. Leroy LibmanKelly Davis   ANESTHESIA:   general  EBL:  20 mL   BLOOD ADMINISTERED:none  DRAINS: none   LOCAL MEDICATIONS USED:  MARCAINE     SPECIMEN:  Source of Specimen:  right fallopian tube with ectopic gestation  DISPOSITION OF SPECIMEN:  PATHOLOGY  COUNTS:  YES  TOURNIQUET:  * No tourniquets in log *  DICTATION: .Note written in EPIC  PLAN OF CARE: Discharge to home after PACU  PATIENT DISPOSITION:  PACU - hemodynamically stable.   Delay start of Pharmacological VTE agent (>24hrs) due to surgical blood loss or risk of bleeding: not applicable  Complications: none immediate  Christy Jones, M.D., Evern CoreFACOG

## 2017-06-20 NOTE — Progress Notes (Signed)
Day of Surgery Procedure(s) (LRB): DIAGNOSTIC LAPAROSCOPY WITH REMOVAL OF ECTOPIC PREGNANCY (N/A)  Subjective: Patient reports incisional pain.  Pt c/o pain near the RLQ port site.    Objective: I have reviewed patient's vital signs, intake and output, medications, labs and radiology results.  General: alert and moderate distress GI: soft, non-tender; bowel sounds normal; no masses,  no organomegaly and incision: clean and there is acute tenderness directly over the RLQ port site with some induration. There is no bleedign from the ports site.    CLINICAL DATA:  Status post laparoscopic salpingectomy for ectopic pregnancy. Right lower quadrant abdominal pain.  EXAM: CT PELVIS WITHOUT CONTRAST  TECHNIQUE: Multidetector CT imaging of the pelvis was performed following the standard protocol without intravenous contrast.  COMPARISON:  None.  FINDINGS: Urinary Tract:  No ureteral calculi or bladder calculi.  Bowel:  The visualized small bowel and colon are grossly normal.  Vascular/Lymphatic: Normal caliber lower abdominal aorta and iliac arteries. No atherosclerotic calcifications. No lymphadenopathy.  Reproductive: The uterus is grossly normal. Both ovaries are grossly normal.  Other: Moderate amount of slightly hyperdense pelvic fluid likely some component of hematoma.  Moderate-sized subcutaneous hematomas involving the right lower quadrant along with a moderate-sized intramuscular hematoma in the right rectus muscle. The subcutaneous component measures approximately 7.7 x 4.9 x 7.7 cm and the intramuscular component measures approximately 5.7 x 4.2 x 4.7 cm.  Moderate free air is noted in the pelvis which is not unexpected given the recent surgery.  Musculoskeletal: No significant bony findings.  IMPRESSION: 1. Subcutaneous and intramuscular hematomas in the right lower quadrant. 2. Moderate free pelvic fluid with some component of hemorrhage. 3.  Moderate free air noted in the pelvis, not unexpected. Assessment: s/p Procedure(s): DIAGNOSTIC LAPAROSCOPY WITH REMOVAL OF ECTOPIC PREGNANCY (N/A): stable Pt now with hematoma at the port site.  Plan: Advance diet observation and pain meds   If hematoma stable rec discharge to home in the am  LOS: 0 days    Christy RosenthalCarolyn Jones 06/20/2017, 6:08 PM

## 2017-06-20 NOTE — Anesthesia Postprocedure Evaluation (Signed)
Anesthesia Post Note  Patient: Christy Jones  Procedure(s) Performed: DIAGNOSTIC LAPAROSCOPY WITH REMOVAL OF ECTOPIC PREGNANCY (N/A )     Patient location during evaluation: PACU Anesthesia Type: General Level of consciousness: awake and alert Pain management: pain level controlled Vital Signs Assessment: post-procedure vital signs reviewed and stable Respiratory status: spontaneous breathing, nonlabored ventilation and respiratory function stable Cardiovascular status: blood pressure returned to baseline and stable Postop Assessment: no apparent nausea or vomiting Anesthetic complications: no    Last Vitals:  Vitals:   06/20/17 1230 06/20/17 1300  BP: 106/79 109/82  Pulse: 65 70  Resp: 15 16  Temp:    SpO2: 100% 99%    Last Pain:  Vitals:   06/20/17 1300  TempSrc:   PainSc: 2    Pain Goal: Patients Stated Pain Goal: 4 (06/20/17 1001)               Cecile HearingStephen Edward Okley Magnussen

## 2017-06-20 NOTE — Progress Notes (Signed)
Notified by Radiology they brought patient down for us results but then they were notified by radiologist that patient has ectopic pregnancy and they called Dr.Davis and patient should be taken immediately to MAU.  I escorted patient to MAU and provided support.

## 2017-06-20 NOTE — H&P (Signed)
Preoperative History and Physical  Christy Jones is a 31 y.o. [id number] here for surgical management of right ectopic pregnancy with +FHR.   Proposed surgery: right salpingostomy vs salpingectomy  Past Medical History:  Diagnosis Date  . Asthma   . Chlamydia   . Gestational diabetes   . Headache(784.0)   . Pregnancy induced hypertension    post- partem 2012  . Sickle cell trait (HCC)   . Trichomonas   . Umbilical hernia   . Urinary tract infection    Past Surgical History:  Procedure Laterality Date  . CESAREAN SECTION N/A 08/11/2016   Procedure: CESAREAN SECTION;  Surgeon: Waynard Reeds, MD;  Location: [facility];  Service: Obstetrics;  Laterality: N/A;  . CHOLECYSTECTOMY  07/30/2012   Laproscopic     . CHOLECYSTECTOMY N/A 07/30/2012   Procedure: LAPAROSCOPIC CHOLECYSTECTOMY WITH INTRAOPERATIVE CHOLANGIOGRAM;  Surgeon: Ernestene Mention, MD;  Location: [facility] OR;  Service: General;  Laterality: N/A;  . ECTOPIC PREGNANCY SURGERY    . EXPLORATORY LAPAROTOMY     removal of ectopic pregnancy   OB History    Gravida Para Term Preterm AB Living   7 3 3  0 3 2   SAB TAB Ectopic Multiple Live Births   1 1 1  0 1     Patient denies any cervical dysplasia or STIs. Medications Prior to Admission  Medication Sig Dispense Refill Last Dose  . albuterol (PROVENTIL HFA;VENTOLIN HFA) 108 (90 BASE) MCG/ACT inhaler Inhale 2 puffs into the lungs 4 (four) times daily as needed (asthma).   More than a month at Unknown time  . Prenatal Vit-Fe Fumarate-FA (MULTIVITAMIN-PRENATAL) 27-0.8 MG TABS tablet Take 1 tablet by mouth daily at 12 noon.   Past Week at Unknown time    Allergies  Allergen Reactions  . Lactose Intolerance (Gi) Nausea And Vomiting   Social History:   reports that she has been smoking cigarettes.  She has a 2.50 pack-year smoking history. she has never used smokeless tobacco. She reports that she does not drink alcohol or use drugs. Family History  Problem Relation Age of  Onset  . Asthma Maternal Grandmother   . Diabetes Maternal Grandmother   . Anesthesia problems Neg Hx     Review of Systems: Noncontributory  PHYSICAL EXAM: Blood pressure (!) 129/94, pulse 74, temperature 98.8 F (37.1 C), temperature source Oral, resp. rate 16, last menstrual period 04/01/2017, SpO2 99 %, unknown if currently breastfeeding. General appearance - alert, well appearing, and in no distress Chest - clear to auscultation, no wheezes, rales or rhonchi, symmetric air entry Heart - normal rate and regular rhythm Abdomen - soft, nontender, nondistended, no masses or organomegaly Pelvic - examination not indicated Extremities - peripheral pulses normal, no pedal edema, no clubbing or cyanosis  Labs: Results for orders placed or performed in visit on 06/10/17 (from the past 336 hour(s))  hCG, quantitative, pregnancy   Collection Time: 06/10/17 11:32 AM  Result Value Ref Range   hCG, Beta Chain, Quant, S 1,102 (H) <5 mIU/mL  Results for orders placed or performed during the hospital encounter of 06/08/17 (from the past 336 hour(s))  GC/Chlamydia probe amp ([place])not at [facility]   Collection Time: 06/08/17 12:00 AM  Result Value Ref Range   Chlamydia Negative    Neisseria gonorrhea Negative   Urinalysis, Routine w reflex microscopic   Collection Time: 06/08/17  2:20 AM  Result Value Ref Range   Color, Urine AMBER (A) YELLOW   APPearance CLOUDY (A) CLEAR  Specific Gravity, Urine 1.038 (H) 1.005 - 1.030   pH 5.0 5.0 - 8.0   Glucose, UA NEGATIVE NEGATIVE mg/dL   Hgb urine dipstick NEGATIVE NEGATIVE   Bilirubin Urine NEGATIVE NEGATIVE   Ketones, ur 5 (A) NEGATIVE mg/dL   Protein, ur 30 (A) NEGATIVE mg/dL   Nitrite NEGATIVE NEGATIVE   Leukocytes, UA NEGATIVE NEGATIVE   RBC / HPF 6-30 0 - 5 RBC/hpf   WBC, UA 0-5 0 - 5 WBC/hpf   Bacteria, UA RARE (A) NONE SEEN   Squamous Epithelial / LPF 6-30 (A) NONE SEEN   Mucus PRESENT    Ca Oxalate Crys, UA PRESENT   Pregnancy,  urine POC   Collection Time: 06/08/17  2:27 AM  Result Value Ref Range   Preg Test, Ur POSITIVE (A) NEGATIVE  Wet prep, genital   Collection Time: 06/08/17  2:35 AM  Result Value Ref Range   Yeast Wet Prep HPF POC NONE SEEN NONE SEEN   Trich, Wet Prep NONE SEEN NONE SEEN   Clue Cells Wet Prep HPF POC PRESENT (A) NONE SEEN   WBC, Wet Prep HPF POC MODERATE (A) NONE SEEN   Sperm NONE SEEN   CBC   Collection Time: 06/08/17  2:37 AM  Result Value Ref Range   WBC 11.0 (H) 4.0 - 10.5 K/uL   RBC 4.74 3.87 - 5.11 MIL/uL   Hemoglobin 14.4 12.0 - 15.0 g/dL   HCT 16.140.0 09.636.0 - 04.546.0 %   MCV 84.4 78.0 - 100.0 fL   MCH 30.4 26.0 - 34.0 pg   MCHC 36.0 30.0 - 36.0 g/dL   RDW 40.914.0 81.111.5 - 91.415.5 %   Platelets 228 150 - 400 K/uL  hCG, quantitative, pregnancy   Collection Time: 06/08/17  2:37 AM  Result Value Ref Range   hCG, Beta Chain, Quant, S 365 (H) <5 mIU/mL    Imaging Studies: [place] Ob Transvaginal  Result Date: 06/20/2017 CLINICAL DATA:  Pregnancy of unknown anatomic location EXAM: TRANSVAGINAL OB ULTRASOUND TECHNIQUE: Transvaginal ultrasound was performed for complete evaluation of the gestation as well as the maternal uterus, adnexal regions, and pelvic cul-de-sac. COMPARISON:  06/08/2017 FINDINGS: Intrauterine gestational sac: Not identified Yolk sac:  See below Embryo:  See below Cardiac Activity: See below Heart Rate: See below bpm Subchorionic hemorrhage:  N/A Maternal uterus/adnexae: Retroverted uterus. No intrauterine gestational sac identified. RIGHT ovary normal size and morphology, containing a small corpus luteum. In the RIGHT adnexa, near but separate from the RIGHT ovary, an adnexal mass is identified measuring 2.2 x 2.3 x 3.2 cm which contains a gestational sac, yolk sac, and fetal pole. Cardiac activity is detected within the fetal pole. Findings are consistent with a live RIGHT adnexal ectopic pregnancy. LEFT ovary normal size and morphology, 1.5 x 5.7 x 1.6 cm. No additional adnexal  masses. Minimal free pelvic fluid in LEFT adnexa adjacent to LEFT ovary. IMPRESSION: No intrauterine gestational sac identified. Live RIGHT adnexal ectopic pregnancy with mass measuring 2.2 x 2.3 x 3.2 cm. Critical Value/emergent results were called by telephone at the time of interpretation on 06/20/2017 at 0853 hrs to Dr. Earlene Plateravis, who verbally acknowledged these results. Electronically Signed   By: Ulyses SouthwardMark  Boles M.D.   On: 06/20/2017 08:56   [place] Ob Less Than 14 Weeks With Ob Transvaginal  Result Date: 06/08/2017 CLINICAL DATA:  Pregnant patient in first-trimester pregnancy with vaginal bleeding. Beta HCG 365 EXAM: OBSTETRIC <14 WK US AND TRANSVAGINAL OB US TECHNIQUE: Both transabdominal and transvaginal ultrasound examinations  were performed for complete evaluation of the gestation as well as the maternal uterus, adnexal regions, and pelvic cul-de-sac. Transvaginal technique was performed to assess early pregnancy. COMPARISON:  None this pregnancy. FINDINGS: Intrauterine gestational sac: None Yolk sac:  Not Visualized. Embryo:  Not Visualized. Cardiac Activity: Not Visualized. Maternal uterus/adnexae: The uterus is retroverted. Endometrium measures approximately 10 mm. Left ovary is normal measuring 2.6 x 3.7 x 1.4 cm. Right ovary measures 4.0 x 2.8 x 2.7 cm and contains a small complex cyst with peripheral vascularity, likely a corpus luteum. Trace free fluid in the pelvic cul-de-sac. IMPRESSION: No intrauterine pregnancy or findings suspicious for ectopic pregnancy. Findings are consistent with pregnancy of unknown location and may reflect early intrauterine pregnancy not yet visualized sonographically, occult ectopic pregnancy, or failed pregnancy. Recommend trending of beta HCG and follow-up ultrasound in 10-14 days. Electronically Signed   By: Rubye OaksMelanie  Ehinger M.D.   On: 06/08/2017 03:24    Assessment: Patient Active Problem List   Diagnosis Date Noted  . Ectopic pregnancy 06/20/2017  . Cesarean  delivery, delivered, current hospitalization 08/12/2016  . Term birth of newborn female 02/13/2011    Plan: Patient will undergo surgical management with right salpingostomy vs salpingectomy.   The risks of surgery were discussed in detail with the patient including but not limited to: bleeding which may require transfusion or reoperation; infection which may require antibiotics; injury to surrounding organs which may involve bowel, bladder, ureters ; need for additional procedures including laparoscopy or laparotomy; thromboembolic phenomenon, surgical site problems and other postoperative/anesthesia complications. Likelihood of success in alleviating the patient's condition was discussed. I have discussed with pt in detail the risk of a salpingostomy but, she is adamant that she wants her fallopian tube preserved if possible. I have explained to her that leaving the tube increases her risk of a subsequent ectopic pregnancy. She expresses understanding. Routine postoperative instructions will be reviewed with the patient and her family in detail after surgery.  The patient concurred with the proposed plan, giving informed written consent for the surgery.  Patient has been NPO since last night she will remain NPO for procedure.  Anesthesia and OR aware.  Preoperative prophylactic antibiotics and SCDs ordered on call to the OR.  To OR when ready.  Toneka Fullen L. Harraway-Smith, M.D., [facility] 06/20/2017 10:09 AM

## 2017-06-20 NOTE — Discharge Instructions (Signed)
Salpingectomy, Care After This sheet gives you information about how to care for yourself after your procedure. Your health care provider may also give you more specific instructions. If you have problems or questions, contact your health care provider. What can I expect after the procedure? After your procedure, it is common to have:  Pain in your abdomen.  Some occasional vaginal bleeding (spotting).  Tiredness.  Follow these instructions at home: Incision care   Keep your incision area and your bandage (dressing) clean and dry.  Follow instructions from your health care provider about how to take care of your incision. Make sure you: ? Wash your hands with soap and water before you change your dressing. If soap and water are not available, use hand sanitizer. ? Change your dressing astold by your health care provider. ? Leave stitches (sutures), staples, skin glue, or adhesive strips in place. These skin closures may need to stay in place for 2 weeks or longer. If adhesive strip edges start to loosen and curl up, you may trim the loose edges. Do not remove adhesive strips completely unless your health care provider tells you to do that.  Check your incision area every day for signs of infection. Check for: ? More redness, swelling, or pain. ? More fluid or blood. ? Warmth. ? Pus or a bad smell. Activity   Do not drive or use heavy machinery while taking prescription pain medicine.  Do not drive for 24 hours if you received a medicine to help you relax (sedative).  Rest as directed by your health care provider. Ask your health care provider what activities are safe for you. You should avoid: ? Lifting anything that is heavier than 10 lb (4.5 kg) until your health care provider approves. ? Activities that require a lot of energy.  Until your health care provider approves: ? Do not douche. ? Do not use tampons. ? Do not have sexual intercourse. General instructions  Take  over-the-counter and prescription medicines only as told by your health care provider.  To prevent or treat constipation while you are taking prescription pain medicine, your health care provider may recommend that you: ? Drink enough fluid to keep your urine clear or pale yellow. ? Take over-the-counter or prescription medicines. ? Eat foods that are high in fiber, such as fresh fruits and vegetables, whole grains, and beans. ? Limit foods that are high in fat and processed sugars, such as fried and sweet foods.  Do not take baths, swim, or use a hot tub until your health care provider approves. You may take showers.  Wear compression stockings as told by your health care provider. These stockings help to prevent blood clots and reduce swelling in your legs.  Keep all follow-up visits as told by your health care provider. This is important. Contact a health care provider if:  You have: ? Pain when you urinate. ? More redness, swelling, or pain around your incision. ? More fluid or blood coming from your incision. ? Pus or a bad smell coming from your incision. ? A fever. ? Abdominal pain that gets worse or does not get better with medicine.  Your incision feels warm to the touch.  Your incision starts to break open.  You develop a rash.  You develop nausea and vomiting.  You feel light-headed. Get help right away if:  You develop pain in your chest or leg.  You develop shortness of breath.  You faint.  You have increased vaginal bleeding.  This information is not intended to replace advice given to you by your health care provider. Make sure you discuss any questions you have with your health care provider. Document Released: 09/14/2010 Document Revised: 02/07/2016 Document Reviewed: 02/08/2016 Elsevier Interactive Patient Education  2018 [employer]      Post Anesthesia Home Care Instructions  Activity: Get plenty of rest for the remainder of the day. A  responsible individual must stay with you for 24 hours following the procedure.  For the next 24 hours, DO NOT: -Drive a car -Advertising copywriterperate machinery -Drink alcoholic beverages -Take any medication unless instructed by your physician -Make any legal decisions or sign important papers.  Meals: Start with liquid foods such as gelatin or soup. Progress to regular foods as tolerated. Avoid greasy, spicy, heavy foods. If nausea and/or vomiting occur, drink only clear liquids until the nausea and/or vomiting subsides. Call your physician if vomiting continues.  Special Instructions/Symptoms: Your throat may feel dry or sore from the anesthesia or the breathing tube placed in your throat during surgery. If this causes discomfort, gargle with warm salt water. The discomfort should disappear within 24 hours.  If you had a scopolamine patch placed behind your ear for the management of post- operative nausea and/or vomiting:  1. The medication in the patch is effective for 72 hours, after which it should be removed.  Wrap patch in a tissue and discard in the trash. Wash hands thoroughly with soap and water. 2. You may remove the patch earlier than 72 hours if you experience unpleasant side effects which may include dry mouth, dizziness or visual disturbances. 3. Avoid touching the patch. Wash your hands with soap and water after contact with the patch.     NO IBUPROFEN PRODUCTS (MOTRIN, ADVIL) OR ALEVE UNTIL 5:30PM TODAY.

## 2017-06-20 NOTE — Op Note (Signed)
06/20/2017  12:56 PM  PATIENT:  Christy Jones  31 y.o. female  PRE-OPERATIVE DIAGNOSIS:  Ectopic Pregnancy  POST-OPERATIVE DIAGNOSIS:  Ectopic Pregnancy  PROCEDURE:  Procedure(s): DIAGNOSTIC LAPAROSCOPY WITH REMOVAL OF ECTOPIC PREGNANCY (N/A)  SURGEON:  Surgeon(s) and Role:    * Willodean RosenthalHarraway-Smith, Sylvana Bonk, MD - Primary  ASSISTANTS: Dr. Leroy LibmanKelly Davis   ANESTHESIA:   general  EBL:  20 mL   BLOOD ADMINISTERED:none  DRAINS: none   LOCAL MEDICATIONS USED:  MARCAINE     SPECIMEN:  Source of Specimen:  right fallopian tube with ectopic gestation  DISPOSITION OF SPECIMEN:  PATHOLOGY  COUNTS:  YES  TOURNIQUET:  * No tourniquets in log *  DICTATION: .Note written in EPIC  PLAN OF CARE: Discharge to home after PACU  PATIENT DISPOSITION:  PACU - hemodynamically stable.   Delay start of Pharmacological VTE agent (>24hrs) due to surgical blood loss or risk of bleeding: not applicable  Complications: none immediate  INDICATIONS: 31 y.o. [id number] at Unknown here with the preoperative diagnoses as listed above.  Please refer to preoperative notes for more details. Patient was counseled regarding need for laparoscopic salpingectomy. Risks of surgery including bleeding which may require transfusion or reoperation, infection, injury to bowel or other surrounding organs, need for additional procedures including laparotomy and other postoperative/anesthesia complications were explained to patient.  Written informed consent was obtained.  FINDINGS:  Hemoperitoneum estimated to be about 100 of blood and clots.  Dilated right fallopian tube containing ectopic gestation. Small normal appearing uterus, absent fallopian tube on the left, right ovary and left ovary.  PROCEDURE IN DETAIL:  The patient was taken to the operating room where general anesthesia was administered and was found to be adequate.  She was placed in the dorsal lithotomy position, and was prepped and draped in a sterile manner.   A Foley catheter was inserted into her bladder and attached to constant drainage and a uterine manipulator was then advanced into the uterus .    After an adequate timeout was performed, attention was turned to the abdomen where an umbilical incision was made with the scalpel.  The Optiview 5 mm trocar and sleeve were then advanced without difficulty with the laparoscope under direct visualization into the abdomen.  The abdomen was then insufflated with carbon dioxide gas and adequate pneumoperitoneum was obtained.  A survey of the patient's pelvis and abdomen revealed the findings above.  Two 5-mm left lower quadrant ports were then placed under direct visualization. The ectopic comprised the entire fallopian tube and was already bleeding. As such attempting to preserve the fallopian tube was deemed too unsafe. Attention was then turned to the right fallopian tube which was grasped and ligated from the underlying mesosalpinx and uterine attachment using the Harmonic instrument.  Good hemostasis was noted.  The specimen was placed in an EndoCatch bag and removed from the abdomen intact.  The Nezhat suction irrigator was then used to suction the hemoperitoneum and irrigate the pelvis. The abdomen was desufflated, and all instruments were removed.  The fascial incision of the 10-mm site was reapproximated with a 0 Vicryl figure-of-eight stitch; and all skin incisions were closed with Dermabond. The patient tolerated the procedure well.  All instruments, needles, and sponge counts were correct x 2. The patient was taken to the recovery room in stable condition.   The patient will be discharged to home as per PACU criteria.  Routine postoperative instructions given.  She was prescribed Vicodin and Ibuprofen.  She will  follow up in the clinic in about 2-3 weeks for postoperative evaluation.  Mailin Coglianese L. Erin FullingHarraway-Smith, MD, FACOG Attending Obstetrician & Gynecologist Faculty Practice, [facility]

## 2017-06-20 NOTE — OR Nursing (Signed)
Patients abdomin is distended and ridged at the end of the case

## 2017-06-21 ENCOUNTER — Encounter (HOSPITAL_COMMUNITY): Payer: Self-pay | Admitting: Obstetrics & Gynecology

## 2017-06-21 DIAGNOSIS — O00101 Right tubal pregnancy without intrauterine pregnancy: Secondary | ICD-10-CM | POA: Diagnosis not present

## 2017-06-21 LAB — CBC
HCT: 30.1 % — ABNORMAL LOW (ref 36.0–46.0)
HEMATOCRIT: 30.1 % — AB (ref 36.0–46.0)
HEMOGLOBIN: 11.2 g/dL — AB (ref 12.0–15.0)
Hemoglobin: 11.1 g/dL — ABNORMAL LOW (ref 12.0–15.0)
MCH: 30.3 pg (ref 26.0–34.0)
MCH: 30.4 pg (ref 26.0–34.0)
MCHC: 37.2 g/dL — ABNORMAL HIGH (ref 30.0–36.0)
MCHC: 37.2 g/dL — ABNORMAL HIGH (ref 30.0–36.0)
MCV: 81.6 fL (ref 78.0–100.0)
MCV: 81.8 fL (ref 78.0–100.0)
PLATELETS: 184 10*3/uL (ref 150–400)
Platelets: 193 10*3/uL (ref 150–400)
RBC: 3.69 MIL/uL — AB (ref 3.87–5.11)
RBC: 3.68 MIL/uL — AB (ref 3.87–5.11)
RDW: 13.9 % (ref 11.5–15.5)
RDW: 13.8 % (ref 11.5–15.5)
WBC: 13.7 10*3/uL — AB (ref 4.0–10.5)
WBC: 12.1 10*3/uL — ABNORMAL HIGH (ref 4.0–10.5)

## 2017-06-21 MED ORDER — OXYCODONE-ACETAMINOPHEN 5-325 MG PO TABS
1.0000 | ORAL_TABLET | ORAL | Status: DC | PRN
  Administered 2017-06-21: 2 via ORAL
  Filled 2017-06-21: qty 2

## 2017-06-21 MED ORDER — KETOROLAC TROMETHAMINE 10 MG PO TABS: 10.0000 mg | ORAL_TABLET | Freq: Four times a day (QID) | ORAL | 0 refills | Status: DC | PRN

## 2017-06-21 MED ORDER — LIDOCAINE 5 % EX PTCH: 1.0000 | MEDICATED_PATCH | CUTANEOUS | 0 refills | Status: DC

## 2017-06-21 MED ORDER — OXYCODONE HCL 5 MG PO TABS: 5.0000 mg | ORAL_TABLET | ORAL | 0 refills | Status: DC | PRN

## 2017-06-21 MED ORDER — DOCUSATE SODIUM 100 MG PO CAPS: 100.0000 mg | ORAL_CAPSULE | Freq: Two times a day (BID) | ORAL | 0 refills | Status: DC

## 2017-06-21 MED ORDER — IBUPROFEN 800 MG PO TABS: 800.0000 mg | ORAL_TABLET | Freq: Three times a day (TID) | ORAL | 0 refills | Status: DC | PRN

## 2017-06-21 MED ORDER — LIDOCAINE 5 % EX PTCH
1.0000 | MEDICATED_PATCH | CUTANEOUS | Status: DC
  Filled 2017-06-21 (×2): qty 1

## 2017-06-21 MED ORDER — KETOROLAC TROMETHAMINE 10 MG PO TABS
10.0000 mg | ORAL_TABLET | Freq: Four times a day (QID) | ORAL | Status: DC
  Administered 2017-06-21 (×2): 10 mg via ORAL
  Filled 2017-06-21 (×6): qty 1

## 2017-06-21 NOTE — Progress Notes (Signed)
House coverage called to talk to pt after she came out of the room c/o of no pain medication given. Percocet 5mg  PO last given at 0310. Loss of IV access did not allow for Toradol 30mg  IV to be given. 600mg  Ibuprofen PO offered to pt but she refused. House coverage came to speak to pt and agreed to wait for the next Percocet 5mg  PO dose.

## 2017-06-21 NOTE — Progress Notes (Signed)
Discharge instructions given to pt. Discussed post-op care and pt was able to verbalize instructions back to me. Pt aware of when to contact the MD and when to follow up. Lidocaine patch applied 1 inch above honeycomb incision site (right lower abdomen) and is aware to replace the patch q24h. Pt was given prescriptions and has no further questions or concerns at this time. Pt walked down in stable condition.

## 2017-06-21 NOTE — Discharge Summary (Addendum)
Discharge Summary   Admit Date: 06/20/2017 Discharge Date: 06/21/2017 Discharging Service: Gynecology  Primary OBGYN: None Admitting Physician: Willodean Rosenthalarolyn Harraway-Smith, MD  Discharge Physician: Vergie LivingPickens  Primary Care Provider: Patient, No Pcp Per  Admission Diagnoses: Right fallopian tube ectopic pregnancy  Discharge Diagnoses: Status post laparoscopic right salpingectomy Post op rectus sheath hematoma  Consult Orders: None   Surgeries/Procedures Performed: 12/28 laparoscopic right salpingectomy by Dr. Erin FullingHarraway-Smith  History and Physical: Preoperative History and Physical  Christy Jones is a 31 y.o. [id number] here for surgical management of right ectopic pregnancy with +FHR.   Proposed surgery: right salpingostomy vs salpingectomy      Past Medical History:  Diagnosis Date  . Asthma   . Chlamydia   . Gestational diabetes   . Headache(784.0)   . Pregnancy induced hypertension    post- partem 2012  . Sickle cell trait (HCC)   . Trichomonas   . Umbilical hernia   . Urinary tract infection         Past Surgical History:  Procedure Laterality Date  . CESAREAN SECTION N/A 08/11/2016   Procedure: CESAREAN SECTION;  Surgeon: Waynard ReedsKendra Ross, MD;  [facility];  Service: Obstetrics;  Laterality: N/A;  . CHOLECYSTECTOMY  07/30/2012   Laproscopic     . CHOLECYSTECTOMY N/A 07/30/2012   Procedure: LAPAROSCOPIC CHOLECYSTECTOMY WITH INTRAOPERATIVE CHOLANGIOGRAM;  Surgeon: Ernestene MentionHaywood M Ingram, MD;  [facility];  Service: General;  Laterality: N/A;  . ECTOPIC PREGNANCY SURGERY    . EXPLORATORY LAPAROTOMY     removal of ectopic pregnancy           OB History    Gravida Para Term Preterm AB Living   7 3 3  0 3 2   SAB TAB Ectopic Multiple Live Births   1 1 1  0 1     Patient denies any cervical dysplasia or STIs.        Medications Prior to Admission  Medication Sig Dispense Refill Last Dose  . albuterol (PROVENTIL HFA;VENTOLIN  HFA) 108 (90 BASE) MCG/ACT inhaler Inhale 2 puffs into the lungs 4 (four) times daily as needed (asthma).   More than a month at Unknown time  . Prenatal Vit-Fe Fumarate-FA (MULTIVITAMIN-PRENATAL) 27-0.8 MG TABS tablet Take 1 tablet by mouth daily at 12 noon.   Past Week at Unknown time        Allergies  Allergen Reactions  . Lactose Intolerance (Gi) Nausea And Vomiting   Social History:   reports that she has been smoking cigarettes.  She has a 2.50 pack-year smoking history. she has never used smokeless tobacco. She reports that she does not drink alcohol or use drugs.      Family History  Problem Relation Age of Onset  . Asthma Maternal Grandmother   . Diabetes Maternal Grandmother   . Anesthesia problems Neg Hx     Review of Systems: Noncontributory  PHYSICAL EXAM: Blood pressure (!) 129/94, pulse 74, temperature 98.8 F (37.1 C), temperature source Oral, resp. rate 16, last menstrual period 04/01/2017, SpO2 99 %, unknown if currently breastfeeding. General appearance - alert, well appearing, and in no distress Chest - clear to auscultation, no wheezes, rales or rhonchi, symmetric air entry Heart - normal rate and regular rhythm Abdomen - soft, nontender, nondistended, no masses or organomegaly Pelvic - examination not indicated Extremities - peripheral pulses normal, no pedal edema, no clubbing or cyanosis  Labs:      Results for orders placed or performed in visit on 06/10/17 (from  the past 336 hour(s))  hCG, quantitative, pregnancy   Collection Time: 06/10/17 11:32 AM  Result Value Ref Range   hCG, Beta Chain, Quant, S 1,102 (H) <5 mIU/mL  Results for orders placed or performed during the hospital encounter of 06/08/17 (from the past 336 hour(s))  GC/Chlamydia probe amp ([place])not at [facility]   Collection Time: 06/08/17 12:00 AM  Result Value Ref Range   Chlamydia Negative    Neisseria gonorrhea Negative   Urinalysis, Routine w reflex microscopic    Collection Time: 06/08/17  2:20 AM  Result Value Ref Range   Color, Urine AMBER (A) YELLOW   APPearance CLOUDY (A) CLEAR   Specific Gravity, Urine 1.038 (H) 1.005 - 1.030   pH 5.0 5.0 - 8.0   Glucose, UA NEGATIVE NEGATIVE mg/dL   Hgb urine dipstick NEGATIVE NEGATIVE   Bilirubin Urine NEGATIVE NEGATIVE   Ketones, ur 5 (A) NEGATIVE mg/dL   Protein, ur 30 (A) NEGATIVE mg/dL   Nitrite NEGATIVE NEGATIVE   Leukocytes, UA NEGATIVE NEGATIVE   RBC / HPF 6-30 0 - 5 RBC/hpf   WBC, UA 0-5 0 - 5 WBC/hpf   Bacteria, UA RARE (A) NONE SEEN   Squamous Epithelial / LPF 6-30 (A) NONE SEEN   Mucus PRESENT    Ca Oxalate Crys, UA PRESENT   Pregnancy, urine POC   Collection Time: 06/08/17  2:27 AM  Result Value Ref Range   Preg Test, Ur POSITIVE (A) NEGATIVE  Wet prep, genital   Collection Time: 06/08/17  2:35 AM  Result Value Ref Range   Yeast Wet Prep HPF POC NONE SEEN NONE SEEN   Trich, Wet Prep NONE SEEN NONE SEEN   Clue Cells Wet Prep HPF POC PRESENT (A) NONE SEEN   WBC, Wet Prep HPF POC MODERATE (A) NONE SEEN   Sperm NONE SEEN   CBC   Collection Time: 06/08/17  2:37 AM  Result Value Ref Range   WBC 11.0 (H) 4.0 - 10.5 K/uL   RBC 4.74 3.87 - 5.11 MIL/uL   Hemoglobin 14.4 12.0 - 15.0 g/dL   HCT 16.1 09.6 - 04.5 %   MCV 84.4 78.0 - 100.0 fL   MCH 30.4 26.0 - 34.0 pg   MCHC 36.0 30.0 - 36.0 g/dL   RDW 40.9 81.1 - 91.4 %   Platelets 228 150 - 400 K/uL  hCG, quantitative, pregnancy   Collection Time: 06/08/17  2:37 AM  Result Value Ref Range   hCG, Beta Chain, Quant, S 365 (H) <5 mIU/mL    Imaging Studies:  ImagingResults  US Ob Transvaginal  Result Date: 06/20/2017 CLINICAL DATA:  Pregnancy of unknown anatomic location EXAM: TRANSVAGINAL OB ULTRASOUND TECHNIQUE: Transvaginal ultrasound was performed for complete evaluation of the gestation as well as the maternal uterus, adnexal regions, and pelvic cul-de-sac. COMPARISON:  06/08/2017  FINDINGS: Intrauterine gestational sac: Not identified Yolk sac:  See below Embryo:  See below Cardiac Activity: See below Heart Rate: See below bpm Subchorionic hemorrhage:  N/A Maternal uterus/adnexae: Retroverted uterus. No intrauterine gestational sac identified. RIGHT ovary normal size and morphology, containing a small corpus luteum. In the RIGHT adnexa, near but separate from the RIGHT ovary, an adnexal mass is identified measuring 2.2 x 2.3 x 3.2 cm which contains a gestational sac, yolk sac, and fetal pole. Cardiac activity is detected within the fetal pole. Findings are consistent with a live RIGHT adnexal ectopic pregnancy. LEFT ovary normal size and morphology, 1.5 x 5.7 x 1.6 cm. No additional  adnexal masses. Minimal free pelvic fluid in LEFT adnexa adjacent to LEFT ovary. IMPRESSION: No intrauterine gestational sac identified. Live RIGHT adnexal ectopic pregnancy with mass measuring 2.2 x 2.3 x 3.2 cm. Critical Value/emergent results were called by telephone at the time of interpretation on 06/20/2017 at 0853 hrs to Dr. Earlene Plater, who verbally acknowledged these results. Electronically Signed   By: Ulyses Southward M.D.   On: 06/20/2017 08:56   US Ob Less Than 14 Weeks With Ob Transvaginal  Result Date: 06/08/2017 CLINICAL DATA:  Pregnant patient in first-trimester pregnancy with vaginal bleeding. Beta HCG 365 EXAM: OBSTETRIC <14 WK [place] AND TRANSVAGINAL OB US TECHNIQUE: Both transabdominal and transvaginal ultrasound examinations were performed for complete evaluation of the gestation as well as the maternal uterus, adnexal regions, and pelvic cul-de-sac. Transvaginal technique was performed to assess early pregnancy. COMPARISON:  None this pregnancy. FINDINGS: Intrauterine gestational sac: None Yolk sac:  Not Visualized. Embryo:  Not Visualized. Cardiac Activity: Not Visualized. Maternal uterus/adnexae: The uterus is retroverted. Endometrium measures approximately 10 mm. Left ovary is normal measuring  2.6 x 3.7 x 1.4 cm. Right ovary measures 4.0 x 2.8 x 2.7 cm and contains a small complex cyst with peripheral vascularity, likely a corpus luteum. Trace free fluid in the pelvic cul-de-sac. IMPRESSION: No intrauterine pregnancy or findings suspicious for ectopic pregnancy. Findings are consistent with pregnancy of unknown location and may reflect early intrauterine pregnancy not yet visualized sonographically, occult ectopic pregnancy, or failed pregnancy. Recommend trending of beta HCG and follow-up ultrasound in 10-14 days. Electronically Signed   By: Rubye Oaks M.D.   On: 06/08/2017 03:24     Assessment:     Patient Active Problem List   Diagnosis Date Noted  . Ectopic pregnancy 06/20/2017  . Cesarean delivery, delivered, current hospitalization 08/12/2016  . Term birth of newborn female 02/13/2011    Plan: Patient will undergo surgical management with right salpingostomy vs salpingectomy.   The risks of surgery were discussed in detail with the patient including but not limited to: bleeding which may require transfusion or reoperation; infection which may require antibiotics; injury to surrounding organs which may involve bowel, bladder, ureters ; need for additional procedures including laparoscopy or laparotomy; thromboembolic phenomenon, surgical site problems and other postoperative/anesthesia complications. Likelihood of success in alleviating the patient's condition was discussed. I have discussed with pt in detail the risk of a salpingostomy but, she is adamant that she wants her fallopian tube preserved if possible. I have explained to her that leaving the tube increases her risk of a subsequent ectopic pregnancy. She expresses understanding. Routine postoperative instructions will be reviewed with the patient and her family in detail after surgery.  The patient concurred with the proposed plan, giving informed written consent for the surgery.  Patient has been NPO since last  night she will remain NPO for procedure.  Anesthesia and OR aware.  Preoperative prophylactic antibiotics and SCDs ordered on call to the OR.  To OR when ready.  Christy L. Christy Jones, M.D., [facility] 06/20/2017 10:09 AM           Electronically signed by Willodean Rosenthal, MD at 06/20/2017 10:25 AM    Hospital Course: Pt underwent the above surgery and then had post op pain in the RLQ quadrant. CT scan showed:  CLINICAL DATA:  Status post laparoscopic salpingectomy for ectopic pregnancy. Right lower quadrant abdominal pain.  EXAM: CT PELVIS WITHOUT CONTRAST  TECHNIQUE: Multidetector CT imaging of the pelvis was performed following the standard protocol  without intravenous contrast.  COMPARISON:  None.  FINDINGS: Urinary Tract:  No ureteral calculi or bladder calculi.  Bowel:  The visualized small bowel and colon are grossly normal.  Vascular/Lymphatic: Normal caliber lower abdominal aorta and iliac arteries. No atherosclerotic calcifications. No lymphadenopathy.  Reproductive: The uterus is grossly normal. Both ovaries are grossly normal.  Other: Moderate amount of slightly hyperdense pelvic fluid likely some component of hematoma.  Moderate-sized subcutaneous hematomas involving the right lower quadrant along with a moderate-sized intramuscular hematoma in the right rectus muscle. The subcutaneous component measures approximately 7.7 x 4.9 x 7.7 cm and the intramuscular component measures approximately 5.7 x 4.2 x 4.7 cm.  Moderate free air is noted in the pelvis which is not unexpected given the recent surgery.  Musculoskeletal: No significant bony findings.  IMPRESSION: 1. Subcutaneous and intramuscular hematomas in the right lower quadrant. 2. Moderate free pelvic fluid with some component of hemorrhage. 3. Moderate free air noted in the pelvis, not unexpected.   Electronically Signed   By: Rudie Meyer M.D.   On: 06/20/2017  16:11  She had serial CBCs that stablized and her pain was controlled with PO meds. On the day of discharge, she was also taking PO w/o difficulty had no n/v/fevers/chills and was ambulating and passing flatus.  Discharge Exam:   Current Vital Signs 24h Vital Sign Ranges  T 98.4 F (36.9 C) Temp  Avg: 98.3 F (36.8 C)  Min: 97.7 F (36.5 C)  Max: 98.8 F (37.1 C)  BP 120/65 BP  Min: 101/57  Max: 139/92  HR 67 Pulse  Avg: 72.3  Min: 65  Max: 97  RR 18 Resp  Avg: 17.3  Min: 16  Max: 18  SaO2 100 % Not Delivered SpO2  Avg: 98.6 %  Min: 97 %  Max: 100 %       24 Hour I/O Current Shift I/O  Time Ins Outs 12/28 0701 - 12/29 0700 In: 1500 [I.V.:1500] Out: 70 [Urine:50] No intake/output data recorded.    Patient Vitals for the past 24 hrs:  BP Temp Temp src Pulse Resp SpO2  06/21/17 0754 120/65 98.4 F (36.9 C) Oral 67 18 100 %  06/21/17 0359 (!) 139/92 98.8 F (37.1 C) Oral 74 18 100 %  06/20/17 1951 120/75 97.7 F (36.5 C) Oral 97 18 99 %  06/20/17 1837 111/63 98.3 F (36.8 C) Oral 65 16 100 %  06/20/17 1733 113/75 98 F (36.7 C) - 73 - 99 %  06/20/17 1715 113/73 - - 67 - 97 %  06/20/17 1710 - 98.3 F (36.8 C) - 72 - 98 %  06/20/17 1700 - - - - 16 -  06/20/17 1645 115/82 - - 69 - 97 %  06/20/17 1620 - - - 82 - 97 %  06/20/17 1515 (!) 101/57 - - 67 16 97 %  06/20/17 1510 104/62 - - 68 18 98 %  06/20/17 1425 117/77 - - 74 - 100 %  06/20/17 1345 116/76 - - 65 18 100 %    General appearance: Well nourished, well developed female in no acute distress.  Cardiovascular: S1, S2 normal, no murmur, rub or gallop, regular rate and rhythm Respiratory:  Clear to auscultation bilateral. Normal respiratory effort Abdomen: +BS, moderately distended and ttp at the RLQ port site. Umbilical and LLQ port sites intact with dermabond over them and RLQ with c/d/i tegaderm, honeycomb 4x4 dressing. No obvious hematoma seen in RLQ. No rebound or guarding.  Neuro/Psych:  Normal mood and affect.   Skin:  Warm and dry.   CBC Latest Ref Rng & Units 06/21/2017 06/21/2017 06/20/2017  WBC 4.0 - 10.5 K/uL 12.1(H) 13.7(H) 15.0(H)  Hemoglobin 12.0 - 15.0 g/dL 11.2(L) 11.1(L) 13.6  Hematocrit 36.0 - 46.0 % 30.1(L) 30.1(L) 36.6  Platelets 150 - 400 K/uL 193 184 204    Discharge Disposition:  Home  Patient Instructions:  Routine   Results Pending at Discharge:  surg path  Discharge Medications: Allergies as of 06/21/2017      Reactions   Lactose Intolerance (gi) Nausea And Vomiting      Medication List    TAKE these medications   acetaminophen 500 MG tablet Commonly known as:  TYLENOL Take 1,000 mg by mouth every 6 (six) hours as needed for moderate pain.   albuterol 108 (90 Base) MCG/ACT inhaler Commonly known as:  PROVENTIL HFA;VENTOLIN HFA Inhale 2 puffs into the lungs 4 (four) times daily as needed (asthma).   docusate sodium 100 MG capsule Commonly known as:  COLACE Take 1 capsule (100 mg total) by mouth 2 (two) times daily.   ibuprofen 800 MG tablet Commonly known as:  ADVIL,MOTRIN Take 1 tablet (800 mg total) by mouth every 8 (eight) hours as needed. Do not take if you are taking the toradol. They can't be taken together.   ketorolac 10 MG tablet Commonly known as:  TORADOL Take 1 tablet (10 mg total) by mouth every 6 (six) hours as needed.   lidocaine 5 % Commonly known as:  LIDODERM Place 1 patch onto the skin daily. Remove & Discard patch within 12 hours or as directed by MD   oxyCODONE 5 MG immediate release tablet Commonly known as:  ROXICODONE Take 1-2 tablets (5-10 mg total) by mouth every 4 (four) hours as needed for severe pain.        No future appointments.  In basket request sent for 7-10d post op visit.   Cornelia Copaharlie Marquesa Rath, Jr. MD Attending [facility](Faculty Practice)

## 2017-06-23 NOTE — Progress Notes (Signed)
I have reviewed this chart and agree with the RN/CMA assessment and management.    K. Meryl Davis, M.D. Attending Obstetrician & Gynecologist, Faculty Practice [facility], [facility]  

## 2017-06-26 ENCOUNTER — Encounter: Payer: Self-pay | Admitting: *Deleted

## 2017-06-26 ENCOUNTER — Telehealth: Payer: Self-pay | Admitting: *Deleted

## 2017-06-26 NOTE — Telephone Encounter (Signed)
Called patient to make aware of postop appointment.  No answer.  Left detailed message with appointment date and time.  Encouraged to contact our office if appointment not convenient.

## 2017-07-03 ENCOUNTER — Ambulatory Visit (INDEPENDENT_AMBULATORY_CARE_PROVIDER_SITE_OTHER): Payer: Medicaid Other | Admitting: Obstetrics & Gynecology

## 2017-07-03 ENCOUNTER — Encounter: Payer: Self-pay | Admitting: Obstetrics & Gynecology

## 2017-07-03 VITALS — BP 120/77 | HR 78 | Wt 178.9 lb

## 2017-07-03 DIAGNOSIS — Z9889 Other specified postprocedural states: Secondary | ICD-10-CM

## 2017-07-03 DIAGNOSIS — T148XXA Other injury of unspecified body region, initial encounter: Secondary | ICD-10-CM

## 2017-07-03 NOTE — Progress Notes (Addendum)
History:  32 y.o. [id number] here today for 2 week post op check. Pts post op course was complicated with a hematoma of her RLQ port site. She reports that the pain is better. She reports having to pick up her son who is one and a 'big boy'. She reports good food intake. No issues with bowlel or bladder function. She reports  Relief with the lidocaine patches but, they are quite expensive.  She still has pain meds at home.       The following portions of the patient's history were reviewed and updated as appropriate: allergies, current medications, past family history, past medical history, past social history, past surgical history and problem list.  Review of Systems:  Pertinent items are noted in HPI.   Objective:  Physical Exam Blood pressure 120/77, pulse 78, weight 178 lb 14.4 oz (81.1 kg), last menstrual period 04/01/2017, unknown if currently breastfeeding.  CONSTITUTIONAL: Well-developed, well-nourished female in no acute distress.  HENT:  Normocephalic, atraumatic EYES: Conjunctivae and EOM are normal. No scleral icterus.  NECK: Normal range of motion SKIN: Skin is warm and dry. No rash noted. Not diaphoretic.No pallor. NEUROLGIC: Alert and oriented to person, place, and time. Normal coordination.  Abd: Soft, nontender and nondistended; there is a firm area over the RLQ port site which is improved from her hospitalization. She does have abd bruising that is NT. The port sites are all healing well.  Pelvic: not done  Labs and Imaging Ct Pelvis Wo Contrast  Result Date: 06/20/2017 CLINICAL DATA:  Status post laparoscopic salpingectomy for ectopic pregnancy. Right lower quadrant abdominal pain. EXAM: CT PELVIS WITHOUT CONTRAST TECHNIQUE: Multidetector CT imaging of the pelvis was performed following the standard protocol without intravenous contrast. COMPARISON:  None. FINDINGS: Urinary Tract:  No ureteral calculi or bladder calculi. Bowel:  The visualized small bowel and colon are  grossly normal. Vascular/Lymphatic: Normal caliber lower abdominal aorta and iliac arteries. No atherosclerotic calcifications. No lymphadenopathy. Reproductive: The uterus is grossly normal. Both ovaries are grossly normal. Other: Moderate amount of slightly hyperdense pelvic fluid likely some component of hematoma. Moderate-sized subcutaneous hematomas involving the right lower quadrant along with a moderate-sized intramuscular hematoma in the right rectus muscle. The subcutaneous component measures approximately 7.7 x 4.9 x 7.7 cm and the intramuscular component measures approximately 5.7 x 4.2 x 4.7 cm. Moderate free air is noted in the pelvis which is not unexpected given the recent surgery. Musculoskeletal: No significant bony findings. IMPRESSION: 1. Subcutaneous and intramuscular hematomas in the right lower quadrant. 2. Moderate free pelvic fluid with some component of hemorrhage. 3. Moderate free air noted in the pelvis, not unexpected. Electronically Signed   By: Rudie MeyerP.  Gallerani M.D.   On: 06/20/2017 16:11   [place] Ob Transvaginal  Result Date: 06/20/2017 CLINICAL DATA:  Pregnancy of unknown anatomic location EXAM: TRANSVAGINAL OB ULTRASOUND TECHNIQUE: Transvaginal ultrasound was performed for complete evaluation of the gestation as well as the maternal uterus, adnexal regions, and pelvic cul-de-sac. COMPARISON:  06/08/2017 FINDINGS: Intrauterine gestational sac: Not identified Yolk sac:  See below Embryo:  See below Cardiac Activity: See below Heart Rate: See below bpm Subchorionic hemorrhage:  N/A Maternal uterus/adnexae: Retroverted uterus. No intrauterine gestational sac identified. RIGHT ovary normal size and morphology, containing a small corpus luteum. In the RIGHT adnexa, near but separate from the RIGHT ovary, an adnexal mass is identified measuring 2.2 x 2.3 x 3.2 cm which contains a gestational sac, yolk sac, and fetal pole. Cardiac activity is detected within  the fetal pole. Findings are  consistent with a live RIGHT adnexal ectopic pregnancy. LEFT ovary normal size and morphology, 1.5 x 5.7 x 1.6 cm. No additional adnexal masses. Minimal free pelvic fluid in LEFT adnexa adjacent to LEFT ovary. IMPRESSION: No intrauterine gestational sac identified. Live RIGHT adnexal ectopic pregnancy with mass measuring 2.2 x 2.3 x 3.2 cm. Critical Value/emergent results were called by telephone at the time of interpretation on 06/20/2017 at 0853 hrs to Dr. Earlene Plater, who verbally acknowledged these results. Electronically Signed   By: Ulyses Southward M.D.   On: 06/20/2017 08:56   US Ob Less Than 14 Weeks With Ob Transvaginal  Result Date: 06/08/2017 CLINICAL DATA:  Pregnant patient in first-trimester pregnancy with vaginal bleeding. Beta HCG 365 EXAM: OBSTETRIC <14 WK [place] AND TRANSVAGINAL OB US TECHNIQUE: Both transabdominal and transvaginal ultrasound examinations were performed for complete evaluation of the gestation as well as the maternal uterus, adnexal regions, and pelvic cul-de-sac. Transvaginal technique was performed to assess early pregnancy. COMPARISON:  None this pregnancy. FINDINGS: Intrauterine gestational sac: None Yolk sac:  Not Visualized. Embryo:  Not Visualized. Cardiac Activity: Not Visualized. Maternal uterus/adnexae: The uterus is retroverted. Endometrium measures approximately 10 mm. Left ovary is normal measuring 2.6 x 3.7 x 1.4 cm. Right ovary measures 4.0 x 2.8 x 2.7 cm and contains a small complex cyst with peripheral vascularity, likely a corpus luteum. Trace free fluid in the pelvic cul-de-sac. IMPRESSION: No intrauterine pregnancy or findings suspicious for ectopic pregnancy. Findings are consistent with pregnancy of unknown location and may reflect early intrauterine pregnancy not yet visualized sonographically, occult ectopic pregnancy, or failed pregnancy. Recommend trending of beta HCG and follow-up ultrasound in 10-14 days. Electronically Signed   By: Rubye Oaks M.D.   On:  06/08/2017 03:24    Assessment & Plan:  Post op check. Doing well. Rice sock to right side  Percocet prn No need for contraception- reviewed again with pt that she has had a bilateral salpingectomy due to a prev EC and this current part EC. She was counseled that since she still has a uterus and ovaries she can still attempt conception using IVF.  All questions were answered.   F/u in 4 weeks or sooner prn  Nevan Creighton L. Harraway-Smith, M.D., [facility]

## 2017-07-04 ENCOUNTER — Encounter: Payer: Self-pay | Admitting: Obstetrics & Gynecology

## 2017-07-28 ENCOUNTER — Ambulatory Visit: Payer: Medicaid Other | Admitting: Obstetrics & Gynecology

## 2017-08-11 ENCOUNTER — Ambulatory Visit: Payer: Medicaid Other | Admitting: Obstetrics & Gynecology

## 2017-09-04 ENCOUNTER — Ambulatory Visit: Payer: Medicaid Other | Admitting: Obstetrics & Gynecology

## 2017-09-16 ENCOUNTER — Encounter: Payer: Self-pay | Admitting: Family Medicine

## 2017-09-16 ENCOUNTER — Ambulatory Visit: Payer: Medicaid Other | Admitting: Family Medicine

## 2017-09-16 NOTE — Progress Notes (Signed)
Patient did not keep appointment today. She may call to reschedule.  

## 2019-01-13 ENCOUNTER — Ambulatory Visit: Payer: Self-pay | Admitting: Surgery

## 2019-01-26 ENCOUNTER — Encounter: Payer: Self-pay | Admitting: Podiatry

## 2019-01-26 ENCOUNTER — Other Ambulatory Visit: Payer: Self-pay

## 2019-01-26 ENCOUNTER — Ambulatory Visit (INDEPENDENT_AMBULATORY_CARE_PROVIDER_SITE_OTHER): Payer: Medicaid Other

## 2019-01-26 ENCOUNTER — Ambulatory Visit: Payer: Medicaid Other | Admitting: Podiatry

## 2019-01-26 VITALS — Temp 98.5°F

## 2019-01-26 DIAGNOSIS — L84 Corns and callosities: Secondary | ICD-10-CM | POA: Diagnosis not present

## 2019-01-26 DIAGNOSIS — B351 Tinea unguium: Secondary | ICD-10-CM

## 2019-01-26 DIAGNOSIS — Z79899 Other long term (current) drug therapy: Secondary | ICD-10-CM | POA: Diagnosis not present

## 2019-01-26 DIAGNOSIS — M722 Plantar fascial fibromatosis: Secondary | ICD-10-CM

## 2019-01-26 DIAGNOSIS — M79676 Pain in unspecified toe(s): Secondary | ICD-10-CM

## 2019-01-26 MED ORDER — METHYLPREDNISOLONE 4 MG PO TBPK: ORAL_TABLET | ORAL | 0 refills | Status: DC

## 2019-01-26 MED ORDER — TERBINAFINE HCL 250 MG PO TABS: 250.0000 mg | ORAL_TABLET | Freq: Every day | ORAL | 0 refills | Status: AC

## 2019-01-26 MED ORDER — MELOXICAM 15 MG PO TABS: 15.0000 mg | ORAL_TABLET | Freq: Every day | ORAL | 1 refills | Status: DC

## 2019-01-27 LAB — HEPATIC FUNCTION PANEL
ALT: 40 IU/L — ABNORMAL HIGH (ref 0–32)
AST: 32 IU/L (ref 0–40)
Albumin: 4.5 g/dL (ref 3.8–4.8)
Alkaline Phosphatase: 73 IU/L (ref 39–117)
Bilirubin Total: 0.3 mg/dL (ref 0.0–1.2)
Bilirubin, Direct: 0.1 mg/dL (ref 0.00–0.40)
Total Protein: 6.8 g/dL (ref 6.0–8.5)

## 2019-01-28 NOTE — Progress Notes (Signed)
   Subjective: 33 y.o. female presenting today as a new patient with a chief complaint of discolored, thickened bilateral great toes nails that has been ongoing for the past 2-3 years. She has difficulty trimming the nails. She has not had any treatment for the symptoms. She denies modifying factors.  She also complains of constant throbbing pain to the left heel that began 2-3 months ago. She states the pain is worse in the morning time or when she stands for long periods of time. She has been taking OTC Tylenol and soaking the feet in apple cider vinegar for treatment. The patient also complains of possible fungus between the 4th and 5th digits of the bilateral feet that has been ongoing for the past several months. She has not done anything for treatment and denies modifying factors. Patient is here for further evaluation and treatment.   Past Medical History:  Diagnosis Date  . Asthma   . Chlamydia   . Headache(784.0)   . Pregnancy induced hypertension    post- partem 2012  . Sickle cell trait ([place])   . Trichomonas   . Umbilical hernia   . Urinary tract infection      Objective: Physical Exam General: The patient is alert and oriented x3 in no acute distress.  Dermatology: Hyperkeratotic, discolored, thickened, onychodystrophy of the bilateral great toenails.  Hyperkeratotic tissue noted to the 4th interdigital webspace of the bilateral feet.   Skin is warm, dry and supple bilateral lower extremities. Negative for open lesions or macerations bilateral.   Vascular: Dorsalis Pedis and Posterior Tibial pulses palpable bilateral.  Capillary fill time is immediate to all digits.  Neurological: Epicritic and protective threshold intact bilateral.   Musculoskeletal: Tenderness to palpation to the plantar aspect of the left heel along the plantar fascia. All other joints range of motion within normal limits bilateral. Strength 5/5 in all groups bilateral.   Radiographic exam: Normal  osseous mineralization. Joint spaces preserved. No fracture/dislocation/boney destruction. No other soft tissue abnormalities or radiopaque foreign bodies.   Assessment: 1. Plantar fasciitis left foot 2. Onychomycosis bilateral great toes 3. Heloma molle bilateral 4th interspace  Plan of Care:  1. Patient evaluated. Xrays reviewed.   2. Injection of 0.5cc Celestone soluspan injected into the left plantar fascia.  3. Rx for Medrol Dose Pak placed 4. Rx for Meloxicam ordered for patient. 5. Instructed patient regarding therapies and modalities at home to alleviate symptoms.  6. Order for liver function test placed. 7. Prescription for Lamisil 250 mg #90 provided to patient.  8. Return to clinic in 6 weeks.    Works at [employer].    Edrick Kins, DPM [facility]  Dr. Edrick Kins, DPM    [address], [place] [address]                Office [phone]  Fax [phone]

## 2019-02-21 ENCOUNTER — Encounter (HOSPITAL_BASED_OUTPATIENT_CLINIC_OR_DEPARTMENT_OTHER): Payer: Self-pay | Admitting: Surgery

## 2019-02-21 DIAGNOSIS — M6208 Separation of muscle (nontraumatic), other site: Secondary | ICD-10-CM | POA: Diagnosis present

## 2019-02-21 DIAGNOSIS — K432 Incisional hernia without obstruction or gangrene: Secondary | ICD-10-CM | POA: Diagnosis present

## 2019-02-21 NOTE — H&P (Signed)
General Surgery [facility]  Colbert EwingKayvonna A Krontz DOB: 03/10/1986 Undefined / Language: [place] / Race: Black or African American Female   History of Present Illness  The patient is a 33 year old female who presents with an incisional hernia.  CHIEF COMPLAINT: incisional hernia at umbilicus  Patient is self-referred. She presents with a symptomatic umbilical incisional hernia. Patient is had multiple previous surgical procedures including laparoscopic cholecystectomy, bilateral tubal ligation, and cesarean section. She developed a hernia at the level of the umbilicus following these procedures. It is been present for at least 12 years. She has had 2 pregnancies during which the hernia protruded and cause discomfort. She now notes pain with physical activity. It bothers her during her work at [employer] in [place] and she has to do lifting or straining. She has had no signs or symptoms of intestinal obstruction. She does note that the hernia has become larger over time. She presents today for evaluation for possible hernia repair.   Past Surgical History  Cesarean Section - 1  Gallbladder Surgery - Laparoscopic  Hysterectomy (not due to cancer) - Partial  Resection of Small Bowel   Diagnostic Studies History Colonoscopy  never Mammogram  never Pap Smear  1-5 years ago  Allergies No Known Drug Allergies  Allergies Reconciled   Medication History  No Current Medications Medications Reconciled  Social History Alcohol use  Occasional alcohol use. Caffeine use  Tea. Illicit drug use  Recently quit drug use. Tobacco use  Current every day smoker.  Family History Family history unknown  First Degree Relatives   Pregnancy / Birth History  Age at menarche  10 years. Gravida  5 Irregular periods  Maternal age  34-20 Para  3  Other Problems Asthma  Back Pain  Gastroesophageal Reflux Disease  Umbilical Hernia Repair    Review of Systems General Present- Fatigue. Not Present- Appetite Loss, Chills, Fever, Night Sweats, Weight Gain and Weight Loss. Skin Not Present- Change in Wart/Mole, Dryness, Hives, Jaundice, New Lesions, Non-Healing Wounds, Rash and Ulcer. HEENT Not Present- Earache, Hearing Loss, Hoarseness, Nose Bleed, Oral Ulcers, Ringing in the Ears, Seasonal Allergies, Sinus Pain, Sore Throat, Visual Disturbances, Wears glasses/contact lenses and Yellow Eyes. Respiratory Not Present- Bloody sputum, Chronic Cough, Difficulty Breathing, Snoring and Wheezing. Breast Not Present- Breast Mass, Breast Pain, Nipple Discharge and Skin Changes. Cardiovascular Not Present- Chest Pain, Difficulty Breathing Lying Down, Leg Cramps, Palpitations, Rapid Heart Rate, Shortness of Breath and Swelling of Extremities. Gastrointestinal Not Present- Abdominal Pain, Bloating, Bloody Stool, Change in Bowel Habits, Chronic diarrhea, Constipation, Difficulty Swallowing, Excessive gas, Gets full quickly at meals, Hemorrhoids, Indigestion, Nausea, Rectal Pain and Vomiting. Female Genitourinary Present- Pelvic Pain and Urgency. Not Present- Frequency, Nocturia and Painful Urination. Musculoskeletal Present- Back Pain. Not Present- Joint Pain, Joint Stiffness, Muscle Pain, Muscle Weakness and Swelling of Extremities. Neurological Not Present- Decreased Memory, Fainting, Headaches, Numbness, Seizures, Tingling, Tremor, Trouble walking and Weakness. Psychiatric Not Present- Anxiety, Bipolar, Change in Sleep Pattern, Depression, Fearful and Frequent crying. Endocrine Present- Hot flashes. Not Present- Cold Intolerance, Excessive Hunger, Hair Changes, Heat Intolerance and New Diabetes. Hematology Not Present- Blood Thinners, Easy Bruising, Excessive bleeding, Gland problems, HIV and Persistent Infections.  Vitals Weight: 187 lb Height: 64in Body Surface Area: 1.9 m Body Mass Index: 32.1 kg/m  Temp.: 98.61F (Oral)  Pulse:  79 (Regular)  BP: 130/80(Sitting, Left Arm, Standard)  Physical Exam  See vital signs recorded above  GENERAL APPEARANCE Development: normal Nutritional status: normal Gross  deformities: none  SKIN Rash, lesions, ulcers: none Induration, erythema: none Nodules: none palpable  EYES Conjunctiva and lids: normal Pupils: equal and reactive Iris: normal bilaterally  EARS, NOSE, MOUTH, THROAT External ears: no lesion or deformity External nose: no lesion or deformity Hearing: grossly normal  NECK Symmetric: yes Trachea: midline Thyroid: no palpable nodules in the thyroid bed  CHEST Respiratory effort: normal Retraction or accessory muscle use: no Breath sounds: normal bilaterally Rales, rhonchi, wheeze: none  CARDIOVASCULAR Auscultation: regular rhythm, normal rate Murmurs: none Pulses: carotid and radial pulse 2+ palpable Lower extremity edema: none Lower extremity varicosities: none  ABDOMEN Distension: none Masses: none palpable Tenderness: none Hepatosplenomegaly: not present Hernia: There is an obvious bulge at the level of the umbilicus with sit up maneuver and Valsalva. It is difficult to appreciate the fascial defect but it does augment with Valsalva and sit up maneuver. The surrounding fascia is relatively thin. 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 There are multiple surgical wounds consistent with previous laparoscopic procedures.  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 This is actually an incisional hernia due to prior surgical procedures at the level of the umbilicus. Patient notes that it is intermittently symptomatic both with physical activity at work and at home and particularly with lifting. It has become larger over time. She desires surgical repair.  On physical examination there is a hernia at the level the umbilicus. This is likely an incisional hernia related to her prior surgical procedures. There is attenuation of the surrounding abdominal wall. She does have a moderate to large rectus diastases in the upper abdomen. We discussed operative repair. I explained that we would not repair the rectus diastases. We discussed the use of mesh to reinforce the weak fascia of the abdominal wall. We discussed the location of the surgical incision. We discussed the possibility of recurrence being approximately 10%. We discussed restrictions on her activities after the procedure. The patient understands and wishes to proceed with surgery in the near future.  The risks and benefits of the procedure have been discussed at length with the patient. The patient understands the proposed procedure, potential alternative treatments, and the course of recovery to be expected. All of the patient's questions have been answered at this time. The patient wishes to proceed with surgery.  Armandina Gemma, [facility] Office: [phone]

## 2019-02-22 ENCOUNTER — Other Ambulatory Visit (HOSPITAL_COMMUNITY)
Admission: RE | Admit: 2019-02-22 | Discharge: 2019-02-22 | Disposition: A | Discharge status: Home / Self Care | Payer: Medicaid Other | Source: Ambulatory Visit | Attending: Surgery | Admitting: Surgery

## 2019-02-22 DIAGNOSIS — Z20828 Contact with and (suspected) exposure to other viral communicable diseases: Secondary | ICD-10-CM | POA: Insufficient documentation

## 2019-02-22 DIAGNOSIS — Z01812 Encounter for preprocedural laboratory examination: Secondary | ICD-10-CM | POA: Diagnosis present

## 2019-02-22 LAB — SARS CORONAVIRUS 2 (TAT 6-24 HRS): SARS Coronavirus 2: NEGATIVE

## 2019-02-23 ENCOUNTER — Encounter (HOSPITAL_BASED_OUTPATIENT_CLINIC_OR_DEPARTMENT_OTHER): Payer: Self-pay

## 2019-02-23 NOTE — Progress Notes (Signed)
SPOKE W/  Shiree     SCREENING SYMPTOMS OF COVID 19:   COUGH--NO  RUNNY NOSE--- NO  SORE THROAT---NO  NASAL CONGESTION----NO  SNEEZING----NO  SHORTNESS OF BREATH---NO  DIFFICULTY BREATHING---NO  TEMP >100.0 -----NO  UNEXPLAINED BODY ACHES------NO  CHILLS --------NO   HEADACHES ---------NO  LOSS OF SMELL/ TASTE --------NO    HAVE YOU OR ANY FAMILY MEMBER TRAVELLED PAST 14 DAYS OUT OF THE   COUNTY---Lives in [place] STATE----NO COUNTRY----NO  HAVE YOU OR ANY FAMILY MEMBER BEEN EXPOSED TO ANYONE WITH COVID 19? NO

## 2019-02-23 NOTE — Progress Notes (Signed)
PCP - N/A Cardiologist -N/A   Chest x-ray - N/A EKG - N/A Stress Test - N/A ECHO - N/A Cardiac Cath - N/A  Sleep Study -N/A  CPAP - N/A  Fasting Blood Sugar - N/A Checks Blood Sugar __N/A___ times a day  Blood Thinner Instructions:  N/A Aspirin Instructions: N/A Last Dose:  N/A  Anesthesia review: N/A  Patient denies shortness of breath, fever, cough and chest pain at PAT appointment   Patient verbalized understanding of instructions that were given to them at the PAT appointment. Patient was also instructed that they will need to review over the PAT instructions again at home before surgery. 

## 2019-02-24 ENCOUNTER — Other Ambulatory Visit: Payer: Self-pay

## 2019-02-24 NOTE — Progress Notes (Signed)
Spoke with: Steffani NPO:  After Midnight, no gum, candy, or mints   Arrival time: 0800AM Labs: N/A AM medications: None Pre op orders: Yes Ride home: Synetta Shadow (grandmother) [phone]

## 2019-02-25 ENCOUNTER — Other Ambulatory Visit: Payer: Self-pay

## 2019-02-25 ENCOUNTER — Ambulatory Visit (HOSPITAL_BASED_OUTPATIENT_CLINIC_OR_DEPARTMENT_OTHER)
Admission: RE | Admit: 2019-02-25 | Discharge: 2019-02-25 | Disposition: A | Discharge status: Home / Self Care | Payer: Medicaid Other | Attending: Surgery | Admitting: Surgery

## 2019-02-25 ENCOUNTER — Ambulatory Visit (HOSPITAL_BASED_OUTPATIENT_CLINIC_OR_DEPARTMENT_OTHER): Payer: Medicaid Other | Admitting: Anesthesiology

## 2019-02-25 ENCOUNTER — Encounter (HOSPITAL_BASED_OUTPATIENT_CLINIC_OR_DEPARTMENT_OTHER): Payer: Self-pay | Admitting: Anesthesiology

## 2019-02-25 ENCOUNTER — Encounter (HOSPITAL_BASED_OUTPATIENT_CLINIC_OR_DEPARTMENT_OTHER)
Admission: RE | Disposition: A | Discharge status: Home / Self Care | Payer: Self-pay | Source: Home / Self Care | Attending: Surgery

## 2019-02-25 DIAGNOSIS — K219 Gastro-esophageal reflux disease without esophagitis: Secondary | ICD-10-CM | POA: Insufficient documentation

## 2019-02-25 DIAGNOSIS — F17200 Nicotine dependence, unspecified, uncomplicated: Secondary | ICD-10-CM | POA: Insufficient documentation

## 2019-02-25 DIAGNOSIS — M549 Dorsalgia, unspecified: Secondary | ICD-10-CM | POA: Insufficient documentation

## 2019-02-25 DIAGNOSIS — D573 Sickle-cell trait: Secondary | ICD-10-CM | POA: Diagnosis not present

## 2019-02-25 DIAGNOSIS — R51 Headache: Secondary | ICD-10-CM | POA: Insufficient documentation

## 2019-02-25 DIAGNOSIS — M6208 Separation of muscle (nontraumatic), other site: Secondary | ICD-10-CM | POA: Diagnosis present

## 2019-02-25 DIAGNOSIS — K432 Incisional hernia without obstruction or gangrene: Secondary | ICD-10-CM | POA: Diagnosis present

## 2019-02-25 DIAGNOSIS — Z90710 Acquired absence of both cervix and uterus: Secondary | ICD-10-CM | POA: Diagnosis not present

## 2019-02-25 DIAGNOSIS — G709 Myoneural disorder, unspecified: Secondary | ICD-10-CM | POA: Insufficient documentation

## 2019-02-25 DIAGNOSIS — Z9049 Acquired absence of other specified parts of digestive tract: Secondary | ICD-10-CM | POA: Insufficient documentation

## 2019-02-25 DIAGNOSIS — I10 Essential (primary) hypertension: Secondary | ICD-10-CM | POA: Diagnosis not present

## 2019-02-25 DIAGNOSIS — J45909 Unspecified asthma, uncomplicated: Secondary | ICD-10-CM | POA: Diagnosis not present

## 2019-02-25 HISTORY — DX: Gastro-esophageal reflux disease without esophagitis: K21.9

## 2019-02-25 HISTORY — PX: INCISIONAL HERNIA REPAIR: SHX193

## 2019-02-25 LAB — POCT I-STAT, CHEM 8
BUN: 10 mg/dL (ref 6–20)
Calcium, Ion: 1.17 mmol/L (ref 1.15–1.40)
Chloride: 106 mmol/L (ref 98–111)
Creatinine, Ser: 0.6 mg/dL (ref 0.44–1.00)
Glucose, Bld: 104 mg/dL — ABNORMAL HIGH (ref 70–99)
HCT: 41 % (ref 36.0–46.0)
Hemoglobin: 13.9 g/dL (ref 12.0–15.0)
Potassium: 5.8 mmol/L — ABNORMAL HIGH (ref 3.5–5.1)
Sodium: 139 mmol/L (ref 135–145)
TCO2: 24 mmol/L (ref 22–32)

## 2019-02-25 LAB — POCT PREGNANCY, URINE: Preg Test, Ur: NEGATIVE

## 2019-02-25 SURGERY — REPAIR, HERNIA, INCISIONAL
Anesthesia: General | Site: Abdomen

## 2019-02-25 MED ORDER — ACETAMINOPHEN 500 MG PO TABS
1000.0000 mg | ORAL_TABLET | Freq: Once | ORAL | Status: AC
  Administered 2019-02-25: 1000 mg via ORAL
  Filled 2019-02-25: qty 2

## 2019-02-25 MED ORDER — FENTANYL CITRATE (PF) 100 MCG/2ML IJ SOLN
INTRAMUSCULAR | Status: AC
  Filled 2019-02-25: qty 2

## 2019-02-25 MED ORDER — CEFAZOLIN SODIUM-DEXTROSE 2-4 GM/100ML-% IV SOLN
INTRAVENOUS | Status: AC
  Filled 2019-02-25: qty 100

## 2019-02-25 MED ORDER — LACTATED RINGERS IV SOLN
INTRAVENOUS | Status: DC
  Administered 2019-02-25 (×2): via INTRAVENOUS
  Filled 2019-02-25: qty 1000

## 2019-02-25 MED ORDER — CHLORHEXIDINE GLUCONATE CLOTH 2 % EX PADS
6.0000 | MEDICATED_PAD | Freq: Once | CUTANEOUS | Status: DC
  Filled 2019-02-25: qty 6

## 2019-02-25 MED ORDER — DEXAMETHASONE SODIUM PHOSPHATE 10 MG/ML IJ SOLN
INTRAMUSCULAR | Status: AC
  Filled 2019-02-25: qty 1

## 2019-02-25 MED ORDER — TRAMADOL HCL 50 MG PO TABS: 50.0000 mg | ORAL_TABLET | Freq: Four times a day (QID) | ORAL | 0 refills | Status: DC | PRN

## 2019-02-25 MED ORDER — ONDANSETRON HCL 4 MG/2ML IJ SOLN
INTRAMUSCULAR | Status: DC | PRN
  Administered 2019-02-25: 4 mg via INTRAVENOUS

## 2019-02-25 MED ORDER — PROMETHAZINE HCL 25 MG/ML IJ SOLN
6.2500 mg | INTRAMUSCULAR | Status: DC | PRN
  Filled 2019-02-25: qty 1

## 2019-02-25 MED ORDER — BUPIVACAINE HCL (PF) 0.25 % IJ SOLN
INTRAMUSCULAR | Status: DC | PRN
  Administered 2019-02-25: 20 mL

## 2019-02-25 MED ORDER — FENTANYL CITRATE (PF) 100 MCG/2ML IJ SOLN
INTRAMUSCULAR | Status: DC | PRN
  Administered 2019-02-25: 50 ug via INTRAVENOUS
  Administered 2019-02-25 (×2): 25 ug via INTRAVENOUS

## 2019-02-25 MED ORDER — SUCCINYLCHOLINE CHLORIDE 200 MG/10ML IV SOSY
PREFILLED_SYRINGE | INTRAVENOUS | Status: AC
  Filled 2019-02-25: qty 10

## 2019-02-25 MED ORDER — PROPOFOL 10 MG/ML IV BOLUS
INTRAVENOUS | Status: DC | PRN
  Administered 2019-02-25: 200 mg via INTRAVENOUS

## 2019-02-25 MED ORDER — CEFAZOLIN SODIUM-DEXTROSE 2-4 GM/100ML-% IV SOLN
2.0000 g | INTRAVENOUS | Status: AC
  Administered 2019-02-25: 12:00:00 2 g via INTRAVENOUS
  Filled 2019-02-25: qty 100

## 2019-02-25 MED ORDER — MIDAZOLAM HCL 5 MG/5ML IJ SOLN
INTRAMUSCULAR | Status: DC | PRN
  Administered 2019-02-25: 2 mg via INTRAVENOUS

## 2019-02-25 MED ORDER — ONDANSETRON HCL 4 MG/2ML IJ SOLN
INTRAMUSCULAR | Status: AC
  Filled 2019-02-25: qty 2

## 2019-02-25 MED ORDER — DEXAMETHASONE SODIUM PHOSPHATE 4 MG/ML IJ SOLN
INTRAMUSCULAR | Status: DC | PRN
  Administered 2019-02-25: 10 mg via INTRAVENOUS

## 2019-02-25 MED ORDER — OXYCODONE-ACETAMINOPHEN 5-325 MG PO TABS
ORAL_TABLET | ORAL | Status: AC
  Filled 2019-02-25: qty 1

## 2019-02-25 MED ORDER — KETOROLAC TROMETHAMINE 30 MG/ML IJ SOLN
INTRAMUSCULAR | Status: AC
  Filled 2019-02-25: qty 1

## 2019-02-25 MED ORDER — MIDAZOLAM HCL 2 MG/2ML IJ SOLN
INTRAMUSCULAR | Status: AC
  Filled 2019-02-25: qty 2

## 2019-02-25 MED ORDER — FENTANYL CITRATE (PF) 100 MCG/2ML IJ SOLN
25.0000 ug | INTRAMUSCULAR | Status: DC | PRN
  Filled 2019-02-25: qty 1

## 2019-02-25 MED ORDER — OXYCODONE-ACETAMINOPHEN 5-325 MG PO TABS
1.0000 | ORAL_TABLET | ORAL | Status: DC | PRN
  Administered 2019-02-25: 1 via ORAL
  Filled 2019-02-25: qty 1

## 2019-02-25 MED ORDER — KETOROLAC TROMETHAMINE 30 MG/ML IJ SOLN
INTRAMUSCULAR | Status: DC | PRN
  Administered 2019-02-25: 30 mg via INTRAVENOUS

## 2019-02-25 MED ORDER — SUCCINYLCHOLINE CHLORIDE 20 MG/ML IJ SOLN
INTRAMUSCULAR | Status: DC | PRN
  Administered 2019-02-25: 40 mg via INTRAVENOUS

## 2019-02-25 MED ORDER — PROPOFOL 10 MG/ML IV BOLUS
INTRAVENOUS | Status: AC
  Filled 2019-02-25: qty 20

## 2019-02-25 MED ORDER — LIDOCAINE 2% (20 MG/ML) 5 ML SYRINGE
INTRAMUSCULAR | Status: AC
  Filled 2019-02-25: qty 5

## 2019-02-25 MED ORDER — LIDOCAINE HCL (CARDIAC) PF 100 MG/5ML IV SOSY
PREFILLED_SYRINGE | INTRAVENOUS | Status: DC | PRN
  Administered 2019-02-25: 100 mg via INTRAVENOUS

## 2019-02-25 MED ORDER — BUPIVACAINE-EPINEPHRINE 0.5% -1:200000 IJ SOLN
INTRAMUSCULAR | Status: DC | PRN
  Administered 2019-02-25: 1 mL

## 2019-02-25 MED ORDER — ACETAMINOPHEN 500 MG PO TABS
ORAL_TABLET | ORAL | Status: AC
  Filled 2019-02-25: qty 2

## 2019-02-25 SURGICAL SUPPLY — 60 items
BENZOIN TINCTURE PRP APPL 2/3 (GAUZE/BANDAGES/DRESSINGS) ×3 IMPLANT
BINDER ABDOM UNIV 12 (MISCELLANEOUS) IMPLANT
BLADE CLIPPER SENSICLIP SURGIC (BLADE) ×3 IMPLANT
BLADE HEX COATED 2.75 (ELECTRODE) ×3 IMPLANT
BLADE SURG 10 STRL SS (BLADE) ×3 IMPLANT
BLADE SURG 15 STRL LF DISP TIS (BLADE) ×1 IMPLANT
BLADE SURG 15 STRL SS (BLADE) ×2
CANISTER SUCT 3000ML PPV (MISCELLANEOUS) ×3 IMPLANT
CANISTER SUCTION 1200CC (MISCELLANEOUS) ×3 IMPLANT
CLOSURE WOUND 1/2 X4 (GAUZE/BANDAGES/DRESSINGS) ×1
CLOTH BEACON ORANGE TIMEOUT ST (SAFETY) ×3 IMPLANT
COVER BACK TABLE 60X90IN (DRAPES) ×3 IMPLANT
COVER MAYO STAND STRL (DRAPES) ×3 IMPLANT
COVER WAND RF STERILE (DRAPES) ×6 IMPLANT
DERMABOND ADVANCED (GAUZE/BANDAGES/DRESSINGS) ×2
DERMABOND ADVANCED .7 DNX12 (GAUZE/BANDAGES/DRESSINGS) ×1 IMPLANT
DISSECTOR ROUND CHERRY 3/8 STR (MISCELLANEOUS) IMPLANT
DRAPE LAPAROSCOPIC ABDOMINAL (DRAPES) ×3 IMPLANT
ELECT REM PT RETURN 9FT ADLT (ELECTROSURGICAL) ×3
ELECTRODE REM PT RTRN 9FT ADLT (ELECTROSURGICAL) ×1 IMPLANT
GAUZE SPONGE 4X4 12PLY STRL (GAUZE/BANDAGES/DRESSINGS) IMPLANT
GLOVE BIO SURGEON STRL SZ 6.5 (GLOVE) ×2 IMPLANT
GLOVE BIO SURGEON STRL SZ7 (GLOVE) ×3 IMPLANT
GLOVE BIO SURGEONS STRL SZ 6.5 (GLOVE) ×1
GLOVE BIOGEL PI IND STRL 6.5 (GLOVE) ×2 IMPLANT
GLOVE BIOGEL PI IND STRL 7.5 (GLOVE) ×1 IMPLANT
GLOVE BIOGEL PI INDICATOR 6.5 (GLOVE) ×4
GLOVE BIOGEL PI INDICATOR 7.5 (GLOVE) ×2
GLOVE SURG ORTHO 8.0 STRL STRW (GLOVE) ×3 IMPLANT
GOWN SURGICAL XLG (GOWNS) ×3 IMPLANT
KIT TURNOVER CYSTO (KITS) ×3 IMPLANT
MANIFOLD NEPTUNE II (INSTRUMENTS) IMPLANT
MESH VENTRALEX ST 2.5 CRC MED (Mesh General) ×3 IMPLANT
NEEDLE HYPO 22GX1.5 SAFETY (NEEDLE) ×3 IMPLANT
NEEDLE HYPO 25X1 1.5 SAFETY (NEEDLE) IMPLANT
NS IRRIG 500ML POUR BTL (IV SOLUTION) ×3 IMPLANT
PACK BASIN DAY SURGERY FS (CUSTOM PROCEDURE TRAY) ×3 IMPLANT
PENCIL BUTTON HOLSTER BLD 10FT (ELECTRODE) ×3 IMPLANT
SPONGE LAP 4X18 RFD (DISPOSABLE) ×3 IMPLANT
STAPLER VISISTAT 35W (STAPLE) IMPLANT
STRIP CLOSURE SKIN 1/2X4 (GAUZE/BANDAGES/DRESSINGS) ×2 IMPLANT
SUT ETHIBOND 0 (SUTURE) ×6 IMPLANT
SUT MNCRL AB 4-0 PS2 18 (SUTURE) ×3 IMPLANT
SUT NOVA 0 T19/GS 22DT (SUTURE) ×6 IMPLANT
SUT NOVA 1 T20/GS 25DT (SUTURE) ×6 IMPLANT
SUT PDS AB 1 CTX 36 (SUTURE) ×6 IMPLANT
SUT VIC AB 2-0 CT2 27 (SUTURE) IMPLANT
SUT VIC AB 3-0 54X BRD REEL (SUTURE) IMPLANT
SUT VIC AB 3-0 BRD 54 (SUTURE)
SUT VIC AB 3-0 SH 18 (SUTURE) ×3 IMPLANT
SUT VICRYL 2 0 18  UND BR (SUTURE) ×2
SUT VICRYL 2 0 18 UND BR (SUTURE) ×1 IMPLANT
SUT VICRYL 4-0 PS2 18IN ABS (SUTURE) ×3 IMPLANT
SYR CONTROL 10ML LL (SYRINGE) ×3 IMPLANT
TOWEL OR 17X26 10 PK STRL BLUE (TOWEL DISPOSABLE) ×6 IMPLANT
TRAY DSU PREP LF (CUSTOM PROCEDURE TRAY) ×3 IMPLANT
TUBE CONNECTING 12'X1/4 (SUCTIONS) ×1
TUBE CONNECTING 12X1/4 (SUCTIONS) ×2 IMPLANT
WATER STERILE IRR 500ML POUR (IV SOLUTION) ×3 IMPLANT
YANKAUER SUCT BULB TIP NO VENT (SUCTIONS) ×3 IMPLANT

## 2019-02-25 NOTE — Transfer of Care (Signed)
Immediate Anesthesia Transfer of Care Note  Patient: Christy Jones  Procedure(s) Performed: Procedure(s) (LRB): OPEN INCISIONAL HERNIA REPAIR WITH MESH PATCH (N/A)  Patient Location: PACU  Anesthesia Type: General  Level of Consciousness: awake, sedated, patient cooperative and responds to stimulation  Airway & Oxygen Therapy: Patient Spontanous Breathing and Patient connected to  [place] 02 and soft FM  Post-op Assessment: Report given to PACU RN, Post -op Vital signs reviewed and stable and Patient moving all extremities  Post vital signs: Reviewed and stable  Complications: No apparent anesthesia complications

## 2019-02-25 NOTE — Anesthesia Procedure Notes (Signed)
Procedure Name: LMA Insertion Date/Time: 02/25/2019 12:03 PM Performed by: Justice Rocher, CRNA Pre-anesthesia Checklist: Patient identified, Emergency Drugs available, Suction available and Patient being monitored Patient Re-evaluated:Patient Re-evaluated prior to induction Oxygen Delivery Method: Circle system utilized Preoxygenation: Pre-oxygenation with 100% oxygen Induction Type: IV induction Ventilation: Mask ventilation without difficulty LMA: LMA inserted LMA Size: 4.0 Number of attempts: 1 Airway Equipment and Method: Bite block Placement Confirmation: positive ETCO2 and breath sounds checked- equal and bilateral Tube secured with: Tape Dental Injury: Teeth and Oropharynx as per pre-operative assessment

## 2019-02-25 NOTE — Anesthesia Postprocedure Evaluation (Signed)
Anesthesia Post Note  Patient: Christy Jones  Procedure(s) Performed: OPEN INCISIONAL HERNIA REPAIR WITH MESH PATCH (N/A Abdomen)     Patient location during evaluation: PACU Anesthesia Type: General Level of consciousness: awake and alert Pain management: pain level controlled Vital Signs Assessment: post-procedure vital signs reviewed and stable Respiratory status: spontaneous breathing, nonlabored ventilation, respiratory function stable and patient connected to nasal cannula oxygen Cardiovascular status: blood pressure returned to baseline and stable Postop Assessment: no apparent nausea or vomiting Anesthetic complications: no    Last Vitals:  Vitals:   02/25/19 1330 02/25/19 1431  BP: 120/84 129/80  Pulse: (!) 56 73  Resp: 13 18  Temp:    SpO2: 97% 99%    Last Pain:  Vitals:   02/25/19 1431  TempSrc:   PainSc: 4                  Catalina Gravel

## 2019-02-25 NOTE — Interval H&P Note (Signed)
History and Physical Interval Note:  02/25/2019 11:49 AM  Christy Jones  has presented today for surgery, with the diagnosis of INCISIONAL HERNIA; REDUCIBLE.  The various methods of treatment have been discussed with the patient and family. After consideration of risks, benefits and other options for treatment, the patient has consented to    Procedure(s): Christy Jones (N/A) as a surgical intervention.    The patient's history has been reviewed, patient examined, no change in status, stable for surgery.  I have reviewed the patient's chart and labs.  Questions were answered to the patient's satisfaction.    Armandina Gemma, [facility] Office: [place]

## 2019-02-25 NOTE — Discharge Instructions (Signed)

## 2019-02-25 NOTE — Anesthesia Preprocedure Evaluation (Signed)
Anesthesia Evaluation  Patient identified by MRN, date of birth, ID band Patient awake    Reviewed: Allergy & Precautions, NPO status , Patient's Chart, lab work & pertinent test results  Airway Mallampati: II  TM Distance: >3 FB Neck ROM: Full    Dental  (+) Teeth Intact, Dental Advisory Given, Chipped   Pulmonary asthma , Current Smoker and Patient abstained from smoking.,    Pulmonary exam normal breath sounds clear to auscultation       Cardiovascular hypertension, Normal cardiovascular exam Rhythm:Regular Rate:Normal     Neuro/Psych  Headaches,  Neuromuscular disease negative psych ROS   GI/Hepatic Neg liver ROS, GERD  ,Incisional hernia, reducible   Endo/Other  diabetesObesity   Renal/GU negative Renal ROS     Musculoskeletal negative musculoskeletal ROS (+)   Abdominal   Peds  Hematology  (+) Blood dyscrasia, Sickle cell trait ,   Anesthesia Other Findings Day of surgery medications reviewed with the patient.  Reproductive/Obstetrics negative OB ROS Ectopic pregnancy                              Anesthesia Physical  Anesthesia Plan  ASA: II  Anesthesia Plan: General   Post-op Pain Management:    Induction: Intravenous and Rapid sequence  PONV Risk Score and Plan: 2 and Dexamethasone, Ondansetron and Midazolam  Airway Management Planned: Oral ETT  Additional Equipment:   Intra-op Plan:   Post-operative Plan: Extubation in OR  Informed Consent: I have reviewed the patients History and Physical, chart, labs and discussed the procedure including the risks, benefits and alternatives for the proposed anesthesia with the patient or authorized representative who has indicated his/her understanding and acceptance.     Dental advisory given  Plan Discussed with: CRNA  Anesthesia Plan Comments:         Anesthesia Quick Evaluation

## 2019-02-25 NOTE — Op Note (Signed)
Incisional Hernia, Open, Procedure Note  Pre-operative Diagnosis: Incisional hernia, reducible  Post-operative Diagnosis: same  Procedure: Open repair of reducible incisional hernia with Ventralex ST mesh patch (6.4 cm)  Surgeon:  Armandina Gemma, MD  Anesthesia:  General  Preparation:  Chlora-prep  Estimated Blood Loss: minimal  Complications:  none  Indications: Patient is self-referred. She presents with a symptomatic umbilical incisional hernia. Patient is had multiple previous surgical procedures including laparoscopic cholecystectomy, bilateral tubal ligation, and cesarean section. She developed a hernia at the level of the umbilicus following these procedures. It is been present for at least 12 years. She has had 2 pregnancies during which the hernia protruded and cause discomfort. She now notes pain with physical activity. It bothers her during her work at [employer] in [place] and she has to do lifting or straining. She has had no signs or symptoms of intestinal obstruction. She does note that the hernia has become larger over time. She presents today for incisional hernia repair.  Procedure Details  The patient was brought to operating room and placed in a supine position on the operating room table.  Following induction of general anesthesia, the patient was prepped and draped in the usual aseptic fashion.  After ascertaining that an adequate level of anesthesia been achieved, an infraumbilical incision was made transversely with a #15 blade.  Dissection was carried through the subcutaneous tissues and the hernia sac was identified.  Hernia sac was dissected off the posterior aspect of the umbilical skin and the entire sac was dissected out down to the fascial defect.  Umbilicus was completely elevated off of the abdominal wall.  Preperitoneal space was developed with gentle blunt dissection.  There is a very large rectus diastasis, but no other fascial defects are  identified.  A Ventralex ST 6.4 cm mesh patch was selected for the repair.  The mesh patch was placed into the preperitoneal space and deployed circumferentially.  It was secured to the overlying fascia with the closure of the fascia with interrupted 0-Ethibond simple sutures.  Local field block was placed with Marcaine.  Subcutaneous tissues were closed with interrupted 3-0 Vicryl sutures.  Skin was anesthetized with local anesthetic.  Skin edges were approximated with a running 4-0 Monocryl subcuticular suture.  Wound was washed and dried and Dermabond applied.  The patient was awakened from anesthesia and brought to the recovery room.  The patient tolerated the procedure well.  Armandina Gemma, MD [facility] Office: [phone]

## 2019-02-26 ENCOUNTER — Encounter (HOSPITAL_BASED_OUTPATIENT_CLINIC_OR_DEPARTMENT_OTHER): Payer: Self-pay | Admitting: Surgery

## 2019-03-09 ENCOUNTER — Ambulatory Visit (INDEPENDENT_AMBULATORY_CARE_PROVIDER_SITE_OTHER): Payer: Medicaid Other | Admitting: Podiatry

## 2019-03-09 ENCOUNTER — Encounter: Payer: Self-pay | Admitting: Podiatry

## 2019-03-09 ENCOUNTER — Other Ambulatory Visit: Payer: Self-pay

## 2019-03-09 DIAGNOSIS — M79676 Pain in unspecified toe(s): Secondary | ICD-10-CM

## 2019-03-09 DIAGNOSIS — M722 Plantar fascial fibromatosis: Secondary | ICD-10-CM | POA: Diagnosis not present

## 2019-03-09 DIAGNOSIS — B351 Tinea unguium: Secondary | ICD-10-CM

## 2019-03-12 NOTE — Progress Notes (Signed)
   Subjective: 33 y.o. female presenting today for follow up evaluation of plantar fasciitis of the left foot, a Heloma molle of the bilateral 4th interspace and fungal bilateral great toenails. She states the plantar fascial pain reoccurred beginning 2-3 weeks ago. The Heloma molles have improved significantly. She notes no significant change in the toenails. She has been taking Meloxicam and Lamisil as directed. She reports completing the Medrol Dose Pak which helped alleviate her pain. She denies any modifying factors at this time. Patient is here for further evaluation and treatment.   Past Medical History:  Diagnosis Date  . Asthma   . Chlamydia   . GERD (gastroesophageal reflux disease)   . Gestational diabetes    with pregnancy x2  . Headache(784.0)   . Pregnancy induced hypertension    post- partem 2012  . Sickle cell trait ([place])   . Trichomonas   . Umbilical hernia   . Urinary tract infection      Objective: Physical Exam General: The patient is alert and oriented x3 in no acute distress.  Dermatology: Hyperkeratotic, discolored, thickened, onychodystrophy of the bilateral great toenails. Skin is warm, dry and supple bilateral lower extremities. Negative for open lesions or macerations bilateral.   Vascular: Dorsalis Pedis and Posterior Tibial pulses palpable bilateral.  Capillary fill time is immediate to all digits.  Neurological: Epicritic and protective threshold intact bilateral.   Musculoskeletal: Tenderness to palpation to the plantar aspect of the left heel along the plantar fascia. All other joints range of motion within normal limits bilateral. Strength 5/5 in all groups bilateral.   Assessment: 1. Plantar fasciitis left foot 2. Onychomycosis bilateral great toes   Plan of Care:  1. Patient evaluated.  2. Injection of 0.5cc Celestone soluspan injected into the left plantar fascia.  3. Continue taking Meloxicam daily with approval of GI surgeon.  4.  Continue taking oral Lamisil as prescribed.  5. Night splint dispense.d  6. Return to clinic in 6 weeks.     Works at [employer]. Recently had hernia surgery on 02/25/2019. Returning to work 1st week of October.    Edrick Kins, DPM [facility]  Dr. Edrick Kins, DPM    [address], [place] [address]                Office [phone]  Fax [phone]

## 2019-04-27 ENCOUNTER — Ambulatory Visit (INDEPENDENT_AMBULATORY_CARE_PROVIDER_SITE_OTHER): Payer: Medicaid Other | Admitting: Podiatry

## 2019-04-27 ENCOUNTER — Other Ambulatory Visit: Payer: Self-pay

## 2019-04-27 ENCOUNTER — Encounter: Payer: Self-pay | Admitting: Podiatry

## 2019-04-27 DIAGNOSIS — B351 Tinea unguium: Secondary | ICD-10-CM

## 2019-04-27 DIAGNOSIS — M79676 Pain in unspecified toe(s): Secondary | ICD-10-CM | POA: Diagnosis not present

## 2019-04-27 DIAGNOSIS — M722 Plantar fascial fibromatosis: Secondary | ICD-10-CM

## 2019-04-27 MED ORDER — MELOXICAM 15 MG PO TABS: 15.0000 mg | ORAL_TABLET | Freq: Every day | ORAL | 1 refills | Status: DC

## 2019-04-30 NOTE — Progress Notes (Signed)
   Subjective: 33 y.o. female presenting today for follow up evaluation of left foot pain. She reports continued pain. She states the injection helped for about one week. She has been taking the Lamisil but states it makes her hair fall out. She reports her nails do appear to be clearing up though. Patient is here for further evaluation and treatment.   Past Medical History:  Diagnosis Date  . Asthma   . Chlamydia   . GERD (gastroesophageal reflux disease)   . Gestational diabetes    with pregnancy x2  . Headache(784.0)   . Pregnancy induced hypertension    post- partem 2012  . Sickle cell trait ([place])   . Trichomonas   . Umbilical hernia   . Urinary tract infection      Objective: Physical Exam General: The patient is alert and oriented x3 in no acute distress.  Dermatology: Hyperkeratotic, discolored, thickened, onychodystrophy of the bilateral great toenails. Skin is warm, dry and supple bilateral lower extremities. Negative for open lesions or macerations bilateral.   Vascular: Dorsalis Pedis and Posterior Tibial pulses palpable bilateral.  Capillary fill time is immediate to all digits.  Neurological: Epicritic and protective threshold intact bilateral.   Musculoskeletal: Tenderness to palpation to the plantar aspect of the left heel along the plantar fascia. All other joints range of motion within normal limits bilateral. Strength 5/5 in all groups bilateral.   Assessment: 1. Plantar fasciitis left foot 2. Onychomycosis bilateral great toes   Plan of Care:  1. Patient evaluated.  2. Continue taking Lamisil daily until finished.  3. Continue using night splint.  4. OTC Powerstep insoles provided.  5. Refill prescription for Meloxicam provided to patient.  6. Return to clinic in 3 months.      Works at [employer]. Recently had hernia surgery on 02/25/2019. Returning to work 1st week of October.    Edrick Kins, DPM [facility]  Dr. Edrick Kins,  DPM    [address], [place] [address]                Office [phone]  Fax [phone]

## 2019-05-31 ENCOUNTER — Encounter: Payer: Self-pay | Admitting: Emergency Medicine

## 2019-05-31 ENCOUNTER — Other Ambulatory Visit: Payer: Self-pay

## 2019-05-31 ENCOUNTER — Emergency Department
Admission: EM | Admit: 2019-05-31 | Discharge: 2019-05-31 | Disposition: A | Discharge status: Home / Self Care | Payer: Medicaid Other | Attending: Emergency Medicine | Admitting: Emergency Medicine

## 2019-05-31 ENCOUNTER — Emergency Department: Payer: Medicaid Other

## 2019-05-31 DIAGNOSIS — Z79899 Other long term (current) drug therapy: Secondary | ICD-10-CM | POA: Insufficient documentation

## 2019-05-31 DIAGNOSIS — R200 Anesthesia of skin: Secondary | ICD-10-CM | POA: Diagnosis present

## 2019-05-31 DIAGNOSIS — M5412 Radiculopathy, cervical region: Secondary | ICD-10-CM | POA: Diagnosis not present

## 2019-05-31 DIAGNOSIS — J45909 Unspecified asthma, uncomplicated: Secondary | ICD-10-CM | POA: Insufficient documentation

## 2019-05-31 DIAGNOSIS — F17210 Nicotine dependence, cigarettes, uncomplicated: Secondary | ICD-10-CM | POA: Diagnosis not present

## 2019-05-31 MED ORDER — TRAMADOL HCL 50 MG PO TABS: 50.0000 mg | ORAL_TABLET | Freq: Four times a day (QID) | ORAL | 0 refills | Status: AC | PRN

## 2019-05-31 MED ORDER — PREDNISONE 10 MG PO TABS: ORAL_TABLET | ORAL | 0 refills | Status: DC

## 2019-05-31 MED ORDER — KETOROLAC TROMETHAMINE 30 MG/ML IJ SOLN
30.0000 mg | Freq: Once | INTRAMUSCULAR | Status: AC
  Administered 2019-05-31: 30 mg via INTRAMUSCULAR
  Filled 2019-05-31: qty 1

## 2019-05-31 NOTE — Discharge Instructions (Signed)
Follow-up with your primary care provider or Dr. Roland Rack who is on-call for orthopedics if any continued problems.  Begin taking the prednisone as directed and tramadol as needed for pain.  Do not drive or operate machinery while taking the tramadol as it could cause drowsiness and increase your risk of injury.  Ice or heat to your neck as needed for discomfort.

## 2019-05-31 NOTE — ED Notes (Signed)
See triage note  Presents with h/a  States she developed h/a on Thursday  Had some n/v  Last time vomited was on Saturday  States she has tried tylenol w/o relief    Also having some numbness to left shoulder into left arm

## 2019-05-31 NOTE — ED Triage Notes (Signed)
Pt reports numbness and tingling from her left shoulder to her left hand sine Thursday. Denies injuries.

## 2019-05-31 NOTE — ED Provider Notes (Signed)
[facility] Emergency Department Provider Note   ____________________________________________   First MD Initiated Contact with Patient 05/31/19 [phone]     (approximate)  I have reviewed the triage vital signs and the nursing notes.   HISTORY  Chief Complaint Numbness   HPI Christy Jones is a 33 y.o. female presents to the ED with complaint of left shoulder pain with radiation down into her hand for the last 4 days.  She also complains of a headache that began on Thursday.  She denies any photophobia but had some nausea and vomiting with the last episode of vomiting on Saturday.  Patient states that she tried Tylenol without any relief.  Numbness to her left upper extremity is constant.  She denies any injury to her shoulder.  At this time she denies any pain.       Past Medical History:  Diagnosis Date  . Asthma   . Chlamydia   . GERD (gastroesophageal reflux disease)   . Gestational diabetes    with pregnancy x2  . Headache(784.0)   . Pregnancy induced hypertension    post- partem 2012  . Sickle cell trait (HCC)   . Trichomonas   . Umbilical hernia   . Urinary tract infection     Patient Active Problem List   Diagnosis Date Noted  . Incisional hernia, without obstruction or gangrene 02/21/2019  . Rectus diastasis 02/21/2019  . Ectopic pregnancy 06/20/2017  . Post-op pain 06/20/2017  . Cesarean delivery, delivered, current hospitalization 08/12/2016  . Term birth of newborn female 02/13/2011    Past Surgical History:  Procedure Laterality Date  . CESAREAN SECTION N/A 08/11/2016   Procedure: CESAREAN SECTION;  Surgeon: Waynard Reeds, MD;  Location: [facility] BIRTHING SUITES;  Service: Obstetrics;  Laterality: N/A;  . CHOLECYSTECTOMY N/A 07/30/2012   Procedure: LAPAROSCOPIC CHOLECYSTECTOMY WITH INTRAOPERATIVE CHOLANGIOGRAM;  Surgeon: Ernestene Mention, MD;  Location: [facility] OR;  Service: General;  Laterality: N/A;  . DIAGNOSTIC LAPAROSCOPY WITH REMOVAL  OF ECTOPIC PREGNANCY N/A 06/20/2017   Procedure: DIAGNOSTIC LAPAROSCOPY WITH REMOVAL OF ECTOPIC PREGNANCY;  Surgeon: Willodean Rosenthal, MD;  Location: [facility] ORS;  Service: Gynecology;  Laterality: N/A;  . EXPLORATORY LAPAROTOMY     removal of ectopic pregnancy  . INCISIONAL HERNIA REPAIR N/A 02/25/2019   Procedure: OPEN INCISIONAL HERNIA REPAIR WITH MESH PATCH;  Surgeon: Darnell Level, MD;  Location: [facility];  Service: General;  Laterality: N/A;    Prior to Admission medications   Medication Sig Start Date End Date Taking? Authorizing Provider  acetaminophen (TYLENOL) 500 MG tablet Take 1,000 mg by mouth every 6 (six) hours as needed for moderate pain.    [provider]  predniSONE (DELTASONE) 10 MG tablet Take 6 tablets  today, on day 2 take 5 tablets, day 3 take 4 tablets, day 4 take 3 tablets, day 5 take  2 tablets and 1 tablet the last day 05/31/19   Tommi Rumps, PA-C  terbinafine (LAMISIL) 250 MG tablet Take 1 tablet (250 mg total) by mouth daily. Patient taking differently: Take 250 mg by mouth every evening.  01/26/19   Felecia Shelling, DPM  traMADol (ULTRAM) 50 MG tablet Take 1 tablet (50 mg total) by mouth every 6 (six) hours as needed. 05/31/19   Tommi Rumps, PA-C    Allergies Lactose intolerance (gi)  Family History  Problem Relation Age of Onset  . Asthma Maternal Grandmother   . Diabetes Maternal Grandmother   . Anesthesia problems  Neg Hx     Social History Social History   Tobacco Use  . Smoking status: Current Every Day Smoker    Packs/day: 0.25    Years: 10.00    Pack years: 2.50    Types: Cigarettes  . Smokeless tobacco: Never Used  Substance Use Topics  . Alcohol use: Not Currently  . Drug use: No    Review of Systems Constitutional: No fever/chills Eyes: No visual changes. ENT: No sore throat. Cardiovascular: Denies chest pain. Respiratory: Denies shortness of breath. Gastrointestinal:  No nausea, no vomiting.   Musculoskeletal: Left upper extremity radiculopathy. Skin: Negative for rash. Neurological: Positive for headaches, negative focal weakness.  Positive numbness.  Left upper extremity. ___________________________________________   PHYSICAL EXAM:  VITAL SIGNS: ED Triage Vitals  Enc Vitals Group     BP 05/31/19 0919 128/86     Pulse Rate 05/31/19 0919 76     Resp 05/31/19 0919 19     Temp 05/31/19 0919 98.2 F (36.8 C)     Temp Source 05/31/19 0919 Oral     SpO2 05/31/19 0919 100 %     Weight 05/31/19 0906 190 lb (86.2 kg)     Height 05/31/19 0906 5\' 4"  (1.626 m)     Head Circumference --      Peak Flow --      Pain Score 05/31/19 0906 0     Pain Loc --      Pain Edu? --      Excl. in GC? --     Constitutional: Alert and oriented. Well appearing and in no acute distress. Eyes: Conjunctivae are normal.  Head: Atraumatic. Neck: No stridor.  No point tenderness on palpation of cervical spine posteriorly.  No skin discoloration or edema is present. Cardiovascular: Normal rate, regular rhythm. Grossly normal heart sounds.  Good peripheral circulation. Respiratory: Normal respiratory effort.  No retractions. Lungs CTAB. Musculoskeletal: On examination of the left upper extremity patient complains of radicular type pain from the trapezius muscle down to her fingers.  She is constantly shaking her arm to "get the feeling back".  There is muscle skeletal trauma noted in this area.  Patient is able to move upper extremity without any difficulty or restriction.  Skin is intact and without discoloration.  Pulses present.  Grip strength is equal bilaterally.  Motor sensory function intact. Neurologic:  Normal speech and language. No gross focal neurologic deficits are appreciated. No gait instability. Skin:  Skin is warm, dry and intact. No rash noted. Psychiatric: Mood and affect are normal. Speech and behavior are normal.  ____________________________________________   LABS (all labs  ordered are listed, but only abnormal results are displayed)  Labs Reviewed - No data to display RADIOLOGY  Official radiology report(s): Dg Cervical Spine 2-3 Views  Result Date: 05/31/2019 CLINICAL DATA:  Left upper extremity radiculopathy. EXAM: CERVICAL SPINE - 2-3 VIEW COMPARISON:  None. FINDINGS: Mild kyphosis in the cervical spine. Vertebral body heights and disc spaces are maintained. Mild degenerative endplate changes at C5-C6. Prevertebral soft tissues are normal. Lung apices are clear. Negative for a fracture. IMPRESSION: 1. No acute abnormality in the cervical spine. 2. Mild degenerative changes at C5-C6. Electronically Signed   By: Richarda OverlieAdam  Henn M.D.   On: 05/31/2019 09:55    ____________________________________________   PROCEDURES  Procedure(s) performed (including Critical Care):  Procedures   ____________________________________________   INITIAL IMPRESSION / ASSESSMENT AND PLAN / ED COURSE  As part of my medical decision making, I reviewed the  following data within the electronic MEDICAL RECORD NUMBER Notes from prior ED visits and  Controlled Substance Database  33 year old female presents to the ED with complaint of radiculopathy in her left upper extremity for approximately 4 days.  She denies any injury to her neck.  She is also had a headache on the left.  She has taken Tylenol without any relief.  X-rays showed degenerative changes C5-C6.  Patient was given Toradol 30 mg IM while in the ED.  She is also given a prescription for prednisone and tramadol to begin taking.  She is to follow-up with her PCP if any continued problems or Dr. Roland Rack who is on-call for orthopedics if not improving.  ____________________________________________   FINAL CLINICAL IMPRESSION(S) / ED DIAGNOSES  Final diagnoses:  Cervical radiculopathy     ED Discharge Orders         Ordered    predniSONE (DELTASONE) 10 MG tablet     05/31/19 1009    traMADol (ULTRAM) 50 MG tablet  Every  6 hours PRN     05/31/19 1019           Note:  This document was prepared using Dragon voice recognition software and may include unintentional dictation errors.    Johnn Hai, PA-C 05/31/19 1147    Blake Divine, MD 06/01/19 [phone]

## 2019-07-06 ENCOUNTER — Ambulatory Visit: Payer: Medicaid Other | Admitting: Podiatry

## 2019-07-20 ENCOUNTER — Ambulatory Visit (INDEPENDENT_AMBULATORY_CARE_PROVIDER_SITE_OTHER): Payer: Medicaid Other

## 2019-07-20 ENCOUNTER — Other Ambulatory Visit: Payer: Self-pay

## 2019-07-20 ENCOUNTER — Ambulatory Visit (INDEPENDENT_AMBULATORY_CARE_PROVIDER_SITE_OTHER): Payer: Medicaid Other | Admitting: Podiatry

## 2019-07-20 ENCOUNTER — Other Ambulatory Visit: Payer: Self-pay | Admitting: Podiatry

## 2019-07-20 DIAGNOSIS — M722 Plantar fascial fibromatosis: Secondary | ICD-10-CM

## 2019-07-20 DIAGNOSIS — M216X2 Other acquired deformities of left foot: Secondary | ICD-10-CM

## 2019-07-20 DIAGNOSIS — L600 Ingrowing nail: Secondary | ICD-10-CM | POA: Diagnosis not present

## 2019-07-20 DIAGNOSIS — M898X7 Other specified disorders of bone, ankle and foot: Secondary | ICD-10-CM

## 2019-07-20 NOTE — Patient Instructions (Signed)
Pre-Operative Instructions  Congratulations, you have decided to take an important step towards improving your quality of life.  You can be assured that the doctors and staff at [facility] will be with you every step of the way.  Here are some important things you should know:  1. Plan to be at the surgery center/hospital at least 1 (one) hour prior to your scheduled time, unless otherwise directed by the surgical center/hospital staff.  You must have a responsible adult accompany you, remain during the surgery and drive you home.  Make sure you have directions to the surgical center/hospital to ensure you arrive on time. 2. If you are having surgery at [facility] or [facility], you will need a copy of your medical history and physical form from your family physician within one month prior to the date of surgery. We will give you a form for your primary physician to complete.  3. We make every effort to accommodate the date you request for surgery.  However, there are times where surgery dates or times have to be moved.  We will contact you as soon as possible if a change in schedule is required.   4. No aspirin/ibuprofen for one week before surgery.  If you are on aspirin, any non-steroidal anti-inflammatory medications (Mobic, Aleve, Ibuprofen) should not be taken seven (7) days prior to your surgery.  You make take Tylenol for pain prior to surgery.  5. Medications - If you are taking daily heart and blood pressure medications, seizure, reflux, allergy, asthma, anxiety, pain or diabetes medications, make sure you notify the surgery center/hospital before the day of surgery so they can tell you which medications you should take or avoid the day of surgery. 6. No food or drink after midnight the night before surgery unless directed otherwise by surgical center/hospital staff. 7. No alcoholic beverages 24-hours prior to surgery.  No smoking 24-hours prior or 24-hours after  surgery. 8. Wear loose pants or shorts. They should be loose enough to fit over bandages, boots, and casts. 9. Don't wear slip-on shoes. Sneakers are preferred. 10. Bring your boot with you to the surgery center/hospital.  Also bring crutches or a walker if your physician has prescribed it for you.  If you do not have this equipment, it will be provided for you after surgery. 11. If you have not been contacted by the surgery center/hospital by the day before your surgery, call to confirm the date and time of your surgery. 12. Leave-time from work may vary depending on the type of surgery you have.  Appropriate arrangements should be made prior to surgery with your employer. 13. Prescriptions will be provided immediately following surgery by your doctor.  Fill these as soon as possible after surgery and take the medication as directed. Pain medications will not be refilled on weekends and must be approved by the doctor. 14. Remove nail polish on the operative foot and avoid getting pedicures prior to surgery. 15. Wash the night before surgery.  The night before surgery wash the foot and leg well with water and the antibacterial soap provided. Be sure to pay special attention to beneath the toenails and in between the toes.  Wash for at least three (3) minutes. Rinse thoroughly with water and dry well with a towel.  Perform this wash unless told not to do so by your physician.  Enclosed: 1 Ice pack (please put in freezer the night before surgery)   1 Hibiclens skin cleaner     Pre-op instructions  If you have any questions regarding the instructions, please do not hesitate to call our office.  [place]: [address], [place], [place] [address] -- [phone]  [place]: [address] [place], [place] [address] -- [phone]  [place]: [address], [place], [place] [address] -- [phone]   Website: [email or web address] 

## 2019-07-21 ENCOUNTER — Telehealth: Payer: Self-pay | Admitting: Podiatry

## 2019-07-21 NOTE — Telephone Encounter (Signed)
I'm calling to schedule sx with Dr. Logan Bores. I don't get off of work until about 1, so if you could call back sometime after 1 so I can answer the phone and talk to you about this. Thank you.

## 2019-07-22 NOTE — Telephone Encounter (Signed)
I attempted to call the patient back.  I left her a message to call me back tomorrow afternoon.

## 2019-07-23 NOTE — Telephone Encounter (Signed)
"  I'm sorry I missed your call.  I'd like to schedule my surgery with Dr. Logan Bores.  When can he do it?"  Dr. Logan Bores can do your surgery on August 26, 2019.  "That's too soon.  I'd like to do it the second or third week of April."  Dr. Logan Bores can do it on April 8 or 15, 2021.  "Let's schedule it for April 15."  I will get it scheduled.  Someone from the surgical center will give you a call a day or two prior to your surgery date and will give you your arrival time.

## 2019-07-27 NOTE — Progress Notes (Signed)
HPI: 34 y.o. female presenting today for follow-up evaluation regarding left foot pain/plantar fasciitis.  Patient has been using over-the-counter insoles and taking meloxicam without any significant alleviation of her symptoms.  She has now been dealing with this left heel pain for greater than 6 months now.  She most recently had an acute flareup with increased pain over the past few days.  She would like to discuss possible surgery for a more definitive solution to her plantar fascial heel pain.  She also has a new complaint today regarding ingrown toenails to the bilateral great toes lateral aspect.  She is dealt with these on and off for several years now.  Finally, the patient complains of another new complaint regarding pain to the lateral aspect of the left ankle.  She says that she has a sharp bump over this area that has been going on for 15+ years.  It rubs in most shoe gear and is aggravated by standing and walking in shoes.  Most recently she has been applying an Ace bandage to the area which gives it minimal relief.  She experiences sharp pain associated with this area.  She denies trauma to the area.  Past Medical History:  Diagnosis Date  . Asthma   . Chlamydia   . GERD (gastroesophageal reflux disease)   . Gestational diabetes    with pregnancy x2  . Headache(784.0)   . Pregnancy induced hypertension    post- partem 2012  . Sickle cell trait ([facility])   . Trichomonas   . Umbilical hernia   . Urinary tract infection      Physical Exam: General: The patient is alert and oriented x3 in no acute distress.  Dermatology: Skin is warm, dry and supple bilateral lower extremities. Negative for open lesions or macerations.  Evidence of ingrowing nail noted to the bilateral great toes lateral border with tenderness to palpation  Vascular: Palpable pedal pulses bilaterally. No edema or erythema noted. Capillary refill within normal limits.  Neurological: Epicritic and protective  threshold grossly intact bilaterally.   Musculoskeletal Exam: Range of motion within normal limits to all pedal and ankle joints bilateral. Muscle strength 5/5 in all groups bilateral.  Tenderness to palpation at the insertion of the plantar fascia to the left medial heel.  There is also a large palpable well adhered osseous lesion noted just distal to the medial malleolus.  This is at the level of the subtalar joint.  Radiographic Exam:  Normal osseous mineralization.  There is some joint space narrowing with beaking of the subtalar joint just under the medial malleolus.  Best visualized on lateral view.  There is an abnormal large osseous growth to the medial aspect of the ankle/subtalar joint.  This correlates clinically with a large palpable lesion.  Assessment: 1.  Plantar fasciitis left-chronic 2.  Ingrown toenail bilateral great toes-lateral borders 3.  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 Patient evaluated. X-Rays reviewed.  2.  Today we discussed possible surgery for the chronic plantar fasciitis that the patient is experiencing.  All possible complications and details the procedure were explained.  No guarantees were expressed or implied.  The patient would like to proceed with surgical intervention.  All patient questions answered.  The patient also states that while she is under anesthesia she would like to go ahead and have partial permanent nail avulsions performed to the bilateral great toes. 3.  Regarding the large osseous  growth, we need to order a CT scan to better visualize and identify the growth.  This is been very symptomatic despite shoe gear modifications for the past 15+ years.  Based on CT scan we may need to go ahead and add this to the surgical consent which would likely consist of exostectomy left medial foot.  This is obviously pending CT scan results 4.  Return to clinic in 3 weeks to review CT scan.  We may need to  add this to the consent       Felecia Shelling, DPM [facility]  Dr. Felecia Shelling, DPM    [address] [place], [place] [address]                Office [phone]  Fax [phone]

## 2019-07-29 ENCOUNTER — Telehealth: Payer: Self-pay

## 2019-07-29 NOTE — Telephone Encounter (Signed)
Office notes have been uploaded to [facility] for further review.

## 2019-07-29 NOTE — Telephone Encounter (Signed)
-----   Message from Felecia Shelling, DPM sent at 07/20/2019 10:00 AM EST ----- Regarding: CT left ankle Please order CT left ankle, medically necessary for surgical planning  Dx: painful exostosis left medial ankle  Thanks, Dr. Logan Bores

## 2019-07-30 ENCOUNTER — Telehealth: Payer: Self-pay

## 2019-07-30 NOTE — Telephone Encounter (Signed)
I faxed office notes to [facility] for further review

## 2019-07-30 NOTE — Telephone Encounter (Signed)
-----   Message from Brent M Evans, DPM sent at 07/20/2019 10:00 AM EST ----- Regarding: CT left ankle Please order CT left ankle, medically necessary for surgical planning  Dx: painful exostosis left medial ankle  Thanks, Dr. Evans  

## 2019-08-05 ENCOUNTER — Telehealth: Payer: Self-pay

## 2019-08-05 DIAGNOSIS — M898X7 Other specified disorders of bone, ankle and foot: Secondary | ICD-10-CM

## 2019-08-05 NOTE — Telephone Encounter (Signed)
-----   Message from Brent M Evans, DPM sent at 07/20/2019 10:00 AM EST ----- Regarding: CT left ankle Please order CT left ankle, medically necessary for surgical planning  Dx: painful exostosis left medial ankle  Thanks, Dr. Evans  

## 2019-08-05 NOTE — Telephone Encounter (Signed)
CT has been approved from 08/03/2019 to 01/25/2020 Auth # [id number] Patient notified of approval via voice mail and informed to contact scheduling to set up appt to her convenience

## 2019-08-09 ENCOUNTER — Ambulatory Visit
Admission: RE | Admit: 2019-08-09 | Discharge: 2019-08-09 | Disposition: A | Discharge status: Home / Self Care | Payer: Medicaid Other | Source: Ambulatory Visit | Attending: Podiatry | Admitting: Podiatry

## 2019-08-09 ENCOUNTER — Other Ambulatory Visit: Payer: Self-pay

## 2019-08-09 DIAGNOSIS — M898X7 Other specified disorders of bone, ankle and foot: Secondary | ICD-10-CM | POA: Diagnosis present

## 2019-08-10 ENCOUNTER — Ambulatory Visit (INDEPENDENT_AMBULATORY_CARE_PROVIDER_SITE_OTHER): Payer: Medicaid Other | Admitting: Podiatry

## 2019-08-10 ENCOUNTER — Other Ambulatory Visit: Payer: Self-pay

## 2019-08-10 DIAGNOSIS — L600 Ingrowing nail: Secondary | ICD-10-CM

## 2019-08-10 DIAGNOSIS — M722 Plantar fascial fibromatosis: Secondary | ICD-10-CM | POA: Diagnosis not present

## 2019-08-10 DIAGNOSIS — M898X7 Other specified disorders of bone, ankle and foot: Secondary | ICD-10-CM

## 2019-08-10 NOTE — Patient Instructions (Signed)
Pre-Operative Instructions  Congratulations, you have decided to take an important step towards improving your quality of life.  You can be assured that the doctors and staff at [facility] will be with you every step of the way.  Here are some important things you should know:  1. Plan to be at the surgery center/hospital at least 1 (one) hour prior to your scheduled time, unless otherwise directed by the surgical center/hospital staff.  You must have a responsible adult accompany you, remain during the surgery and drive you home.  Make sure you have directions to the surgical center/hospital to ensure you arrive on time. 2. If you are having surgery at [facility] or [facility], you will need a copy of your medical history and physical form from your family physician within one month prior to the date of surgery. We will give you a form for your primary physician to complete.  3. We make every effort to accommodate the date you request for surgery.  However, there are times where surgery dates or times have to be moved.  We will contact you as soon as possible if a change in schedule is required.   4. No aspirin/ibuprofen for one week before surgery.  If you are on aspirin, any non-steroidal anti-inflammatory medications (Mobic, Aleve, Ibuprofen) should not be taken seven (7) days prior to your surgery.  You make take Tylenol for pain prior to surgery.  5. Medications - If you are taking daily heart and blood pressure medications, seizure, reflux, allergy, asthma, anxiety, pain or diabetes medications, make sure you notify the surgery center/hospital before the day of surgery so they can tell you which medications you should take or avoid the day of surgery. 6. No food or drink after midnight the night before surgery unless directed otherwise by surgical center/hospital staff. 7. No alcoholic beverages 24-hours prior to surgery.  No smoking 24-hours prior or 24-hours after  surgery. 8. Wear loose pants or shorts. They should be loose enough to fit over bandages, boots, and casts. 9. Don't wear slip-on shoes. Sneakers are preferred. 10. Bring your boot with you to the surgery center/hospital.  Also bring crutches or a walker if your physician has prescribed it for you.  If you do not have this equipment, it will be provided for you after surgery. 11. If you have not been contacted by the surgery center/hospital by the day before your surgery, call to confirm the date and time of your surgery. 12. Leave-time from work may vary depending on the type of surgery you have.  Appropriate arrangements should be made prior to surgery with your employer. 13. Prescriptions will be provided immediately following surgery by your doctor.  Fill these as soon as possible after surgery and take the medication as directed. Pain medications will not be refilled on weekends and must be approved by the doctor. 14. Remove nail polish on the operative foot and avoid getting pedicures prior to surgery. 15. Wash the night before surgery.  The night before surgery wash the foot and leg well with water and the antibacterial soap provided. Be sure to pay special attention to beneath the toenails and in between the toes.  Wash for at least three (3) minutes. Rinse thoroughly with water and dry well with a towel.  Perform this wash unless told not to do so by your physician.  Enclosed: 1 Ice pack (please put in freezer the night before surgery)   1 Hibiclens skin cleaner     Pre-op instructions  If you have any questions regarding the instructions, please do not hesitate to call our office.  [place]: [address], [place], [place] [address] -- [phone]  [place]: [address] [place], [place] [address] -- [phone]  [place]: [address], [place], [place] [address] -- [phone]   Website: [email or web address] 

## 2019-08-17 NOTE — Progress Notes (Signed)
   HPI: 34 y.o. female presenting today for follow-up evaluation regarding left foot pain/plantar fasciitis.  Patient is currently scheduled for surgery on 10/07/2019.  Last visit on 07/20/2019 we did order CT scan to evaluate the bony growth to the medial aspect of the left ankle.  This is been very symptomatic for several years despite conservative treatment.  She presents today to review CT scan results and possibly add this to the surgical consent to remove the bony growth.  Past Medical History:  Diagnosis Date  . Asthma   . Chlamydia   . GERD (gastroesophageal reflux disease)   . Gestational diabetes    with pregnancy x2  . Headache(784.0)   . Pregnancy induced hypertension    post- partem 2012  . Sickle cell trait (HCC)   . Trichomonas   . Umbilical hernia   . Urinary tract infection      Physical Exam: General: The patient is alert and oriented x3 in no acute distress.  Dermatology: Skin is warm, dry and supple bilateral lower extremities. Negative for open lesions or macerations.  Evidence of ingrowing nail noted to the bilateral great toes lateral border with tenderness to palpation  Vascular: Palpable pedal pulses bilaterally. No edema or erythema noted. Capillary refill within normal limits.  Neurological: Epicritic and protective threshold grossly intact bilaterally.   Musculoskeletal Exam: Range of motion within normal limits to all pedal and ankle joints bilateral. Muscle strength 5/5 in all groups bilateral.  Tenderness to palpation at the insertion of the plantar fascia to the left medial heel.  There is also a large palpable well adhered osseous lesion noted just distal to the medial malleolus.  This is at the level of the subtalar joint.  CT impression 08/09/2019: 1. Large bony process projecting off the medial aspect of the posterior talocalcaneal facet. The talar and calcaneal components appear to be pseudo articulating. I do not actually see any bony fusion  between these 2 processes but there could be fibrous union. 2. No other significant bony findings.  Assessment: 1.  Plantar fasciitis left-chronic 2.  Ingrown toenail bilateral great toes-lateral borders 3.  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 Patient evaluated. X-Rays reviewed.  2.  The surgical consent was modified today.  Authorization for surgery was achieved today.  Surgery will consist of endoscopic plantar fasciotomy left.  Partial permanent nail avulsions bilateral great toes lateral borders.  Exostectomy talus left.  Exostectomy calcaneus left. 3.  Cam boot dispensed today 4.  Return to clinic 1 week postop  *Works at [employer], DPM [facility]  Dr. Felecia Shelling, DPM    [address] [place], [place] [address]                Office [phone]  Fax [phone]

## 2019-10-07 ENCOUNTER — Encounter: Payer: Self-pay | Admitting: Podiatry

## 2019-10-07 ENCOUNTER — Other Ambulatory Visit: Payer: Self-pay | Admitting: Podiatry

## 2019-10-07 DIAGNOSIS — M722 Plantar fascial fibromatosis: Secondary | ICD-10-CM | POA: Diagnosis not present

## 2019-10-07 DIAGNOSIS — L600 Ingrowing nail: Secondary | ICD-10-CM | POA: Diagnosis not present

## 2019-10-07 DIAGNOSIS — M898X7 Other specified disorders of bone, ankle and foot: Secondary | ICD-10-CM

## 2019-10-07 DIAGNOSIS — M216X1 Other acquired deformities of right foot: Secondary | ICD-10-CM | POA: Diagnosis not present

## 2019-10-07 MED ORDER — OXYCODONE-ACETAMINOPHEN 5-325 MG PO TABS: 1.0000 | ORAL_TABLET | Freq: Four times a day (QID) | ORAL | 0 refills | Status: DC | PRN

## 2019-10-07 NOTE — Progress Notes (Signed)
PRN postop 

## 2019-10-11 ENCOUNTER — Telehealth: Payer: Self-pay

## 2019-10-11 NOTE — Telephone Encounter (Signed)
Patient called requesting a refill of her Oxycodone.  Her DOS was 10/07/2019.  Please send in if you approve. Thanks!

## 2019-10-12 ENCOUNTER — Other Ambulatory Visit: Payer: Self-pay | Admitting: Podiatry

## 2019-10-12 MED ORDER — OXYCODONE-ACETAMINOPHEN 5-325 MG PO TABS: 1.0000 | ORAL_TABLET | Freq: Four times a day (QID) | ORAL | 0 refills | Status: DC | PRN

## 2019-10-12 NOTE — Progress Notes (Signed)
PRN postop 

## 2019-10-12 NOTE — Telephone Encounter (Signed)
Rx sent 

## 2019-10-12 NOTE — Telephone Encounter (Signed)
Patient notified that pain medication has been sent to her pharmacy

## 2019-10-15 ENCOUNTER — Ambulatory Visit (INDEPENDENT_AMBULATORY_CARE_PROVIDER_SITE_OTHER): Payer: Medicaid Other | Admitting: Podiatry

## 2019-10-15 ENCOUNTER — Other Ambulatory Visit: Payer: Self-pay

## 2019-10-15 ENCOUNTER — Ambulatory Visit (INDEPENDENT_AMBULATORY_CARE_PROVIDER_SITE_OTHER): Payer: Medicaid Other

## 2019-10-15 ENCOUNTER — Encounter: Payer: Self-pay | Admitting: *Deleted

## 2019-10-15 ENCOUNTER — Encounter: Payer: Self-pay | Admitting: Podiatry

## 2019-10-15 VITALS — Temp 97.9°F

## 2019-10-15 DIAGNOSIS — Z9889 Other specified postprocedural states: Secondary | ICD-10-CM

## 2019-10-15 DIAGNOSIS — M722 Plantar fascial fibromatosis: Secondary | ICD-10-CM | POA: Diagnosis not present

## 2019-10-15 DIAGNOSIS — M898X7 Other specified disorders of bone, ankle and foot: Secondary | ICD-10-CM

## 2019-10-19 NOTE — Progress Notes (Signed)
   Subjective:  Patient presents today status post permanent partial nail avulsion of the lateral borders of the bilateral great toes; EPF left; tarsal exostectomy left. DOS: 10/07/2019. She states she is doing well. She has been taking Percocet for pain which helps alleviate her symptoms. She denies any modifying factors. She has been using the CAM boot as instructed. Patient is here for further evaluation and treatment.   Past Medical History:  Diagnosis Date  . Asthma   . Chlamydia   . GERD (gastroesophageal reflux disease)   . Gestational diabetes    with pregnancy x2  . Headache(784.0)   . Pregnancy induced hypertension    post- partem 2012  . Sickle cell trait (HCC)   . Trichomonas   . Umbilical hernia   . Urinary tract infection       Objective/Physical Exam Neurovascular status intact.  Skin incisions appear to be well coapted with sutures and staples intact. No sign of infectious process noted. No dehiscence. No active bleeding noted. Moderate edema noted to the surgical extremity. Nail and respective nail fold appears to be healing appropriately. Open wound to the associated nail fold with a granular wound base and moderate amount of fibrotic tissue. Minimal drainage noted. Mild erythema around the periungual region likely due to phenol chemical matricectomy.  Radiographic Exam:  Osteotomies sites appear to be stable with routine healing.  Assessment: 1. s/p permanent partial nail avulsion of the lateral borders of the bilateral great toes; EPF left; tarsal exostectomy left. DOS: 10/07/2019.    Plan of Care:  1. Patient was evaluated. X-rays reviewed 2. Dressing changed.  3. Continue weightbearing in CAM boot.  4. Continue taking Percocet 5/325 mg as needed for pain.  5. debridement of open wound was performed to the periungual border of the respective toe using a currette. Antibiotic ointment and Band-Aid was applied. 6. Return to clinic in one week.    Felecia Shelling, DPM [facility]  Dr. Felecia Shelling, DPM    [address]                                        [place], [place] [address]                Office [phone]  Fax [phone]

## 2019-10-22 ENCOUNTER — Encounter: Payer: Medicaid Other | Admitting: Podiatry

## 2019-10-26 ENCOUNTER — Ambulatory Visit (INDEPENDENT_AMBULATORY_CARE_PROVIDER_SITE_OTHER): Payer: Medicaid Other | Admitting: Podiatry

## 2019-10-26 ENCOUNTER — Other Ambulatory Visit: Payer: Self-pay

## 2019-10-26 DIAGNOSIS — M898X7 Other specified disorders of bone, ankle and foot: Secondary | ICD-10-CM

## 2019-10-26 DIAGNOSIS — M722 Plantar fascial fibromatosis: Secondary | ICD-10-CM

## 2019-10-26 DIAGNOSIS — Z9889 Other specified postprocedural states: Secondary | ICD-10-CM

## 2019-10-26 MED ORDER — OXYCODONE-ACETAMINOPHEN 5-325 MG PO TABS: 1.0000 | ORAL_TABLET | Freq: Four times a day (QID) | ORAL | 0 refills | Status: AC | PRN

## 2019-11-01 NOTE — Progress Notes (Signed)
   Subjective:  Patient presents today status post permanent partial nail avulsion of the lateral borders of the bilateral great toes; EPF left; tarsal exostectomy left. DOS: 10/07/2019. She reports continued throbbing pain that is increased by going up and down her stairs in her house. She has been icing and elevating the foot for treatment. She has been using the CAM boot as directed. Patient is here for further evaluation and treatment.   Past Medical History:  Diagnosis Date  . Asthma   . Chlamydia   . GERD (gastroesophageal reflux disease)   . Gestational diabetes    with pregnancy x2  . Headache(784.0)   . Pregnancy induced hypertension    post- partem 2012  . Sickle cell trait (HCC)   . Trichomonas   . Umbilical hernia   . Urinary tract infection       Objective/Physical Exam Neurovascular status intact.  Skin incisions appear to be well coapted with sutures and staples intact. No sign of infectious process noted. No dehiscence. No active bleeding noted. Moderate edema noted to the surgical extremity. Nail and respective nail fold appears to be healing appropriately. Open wound to the associated nail fold with a granular wound base and moderate amount of fibrotic tissue. Minimal drainage noted. Mild erythema around the periungual region likely due to phenol chemical matricectomy.  Assessment: 1. s/p permanent partial nail avulsion of the lateral borders of the bilateral great toes; EPF left; tarsal exostectomy left. DOS: 10/07/2019.    Plan of Care:  1. Patient was evaluated.  2. Staples removed.  3. Continue using CAM boot. Weightbearing as tolerated.  4. Physical therapy ordered from [facility].  5. Return to clinic in 4 weeks.    Felecia Shelling, DPM [facility]  Dr. Felecia Shelling, DPM    [address]                                        [place], [place] [address]                Office [phone]  Fax [phone]

## 2019-11-05 ENCOUNTER — Encounter: Payer: Medicaid Other | Admitting: Podiatry

## 2019-11-08 ENCOUNTER — Telehealth: Payer: Self-pay | Admitting: *Deleted

## 2019-11-08 DIAGNOSIS — Z9889 Other specified postprocedural states: Secondary | ICD-10-CM

## 2019-11-08 DIAGNOSIS — M722 Plantar fascial fibromatosis: Secondary | ICD-10-CM

## 2019-11-08 NOTE — Telephone Encounter (Signed)
"  Angie, I am calling about my physical therapy.  I called about the referral for physical therapy.  They do not take Medicaid.  They said [facility] does.  I'm not really sure.  If physical therapy is something that I have to do, they must accept Medicaid."

## 2019-11-09 NOTE — Telephone Encounter (Signed)
Referral has been sent to [facility] main rehab and patient has been notified.

## 2019-11-09 NOTE — Addendum Note (Signed)
Addended by: Geraldine Contras D on: 11/09/2019 10:06 AM   Modules accepted: Orders

## 2019-11-26 ENCOUNTER — Encounter: Payer: Medicaid Other | Admitting: Podiatry

## 2019-12-09 ENCOUNTER — Other Ambulatory Visit: Payer: Self-pay

## 2019-12-09 ENCOUNTER — Encounter: Payer: Self-pay | Admitting: Physical Therapy

## 2019-12-09 ENCOUNTER — Ambulatory Visit: Payer: Medicaid Other | Attending: Podiatry | Admitting: Physical Therapy

## 2019-12-09 DIAGNOSIS — M6281 Muscle weakness (generalized): Secondary | ICD-10-CM | POA: Insufficient documentation

## 2019-12-09 DIAGNOSIS — M25672 Stiffness of left ankle, not elsewhere classified: Secondary | ICD-10-CM | POA: Diagnosis present

## 2019-12-09 DIAGNOSIS — R262 Difficulty in walking, not elsewhere classified: Secondary | ICD-10-CM

## 2019-12-09 DIAGNOSIS — M25572 Pain in left ankle and joints of left foot: Secondary | ICD-10-CM | POA: Diagnosis not present

## 2019-12-09 NOTE — Therapy (Signed)
[place] [facility] [address] [place], [address] Phone: [phone]   Fax:  [phone]  Physical Therapy Evaluation  Patient Details  Name: Christy Jones MRN: [id number] Date of Birth: 05/08/1986 Referring Provider (PT): Felecia Shelling, [place]   Encounter Date: 12/09/2019   PT End of Session - 12/09/19 2024    Visit Number 1    Number of Visits 18    Date for PT Re-Evaluation 03/02/20    Authorization Type Medicaid reporting period from 12/09/2019    Authorization - Visit Number 1    Authorization - Number of Visits 1    Progress Note Due on Visit 4    PT Start Time 1300    PT Stop Time 1400    PT Time Calculation (min) 60 min    Activity Tolerance Patient tolerated treatment well;Patient limited by pain    Behavior During Therapy [facility] for tasks assessed/performed;Anxious   Patient very apprehensive to touch and movement          Past Medical History:  Diagnosis Date   Asthma    Chlamydia    GERD (gastroesophageal reflux disease)    Gestational diabetes    with pregnancy x2   Headache(784.0)    Pregnancy induced hypertension    post- partem 2012   Sickle cell trait (HCC)    Trichomonas    Umbilical hernia    Urinary tract infection     Past Surgical History:  Procedure Laterality Date   CESAREAN SECTION N/A 08/11/2016   Procedure: CESAREAN SECTION;  Surgeon: Waynard Reeds, MD;  Location: [facility] BIRTHING SUITES;  Service: Obstetrics;  Laterality: N/A;   CHOLECYSTECTOMY N/A 07/30/2012   Procedure: LAPAROSCOPIC CHOLECYSTECTOMY WITH INTRAOPERATIVE CHOLANGIOGRAM;  Surgeon: Ernestene Mention, MD;  Location: [facility] OR;  Service: General;  Laterality: N/A;   DIAGNOSTIC LAPAROSCOPY WITH REMOVAL OF ECTOPIC PREGNANCY N/A 06/20/2017   Procedure: DIAGNOSTIC LAPAROSCOPY WITH REMOVAL OF ECTOPIC PREGNANCY;  Surgeon: Willodean Rosenthal, MD;  Location: [facility] ORS;  Service: Gynecology;  Laterality: N/A;    EXPLORATORY LAPAROTOMY     removal of ectopic pregnancy   INCISIONAL HERNIA REPAIR N/A 02/25/2019   Procedure: OPEN INCISIONAL HERNIA REPAIR WITH MESH PATCH;  Surgeon: Darnell Level, MD;  Location: [facility];  Service: General;  Laterality: N/A;    There were no vitals filed for this visit.    Subjective Assessment - 12/09/19 1323    Subjective Patient is here for physical therapy s/p L endoscopic plantar fasciotomy and L tarsal exostectomy with permanent partial nail avulsion of the lateral border of the bilateral great toes on 10/07/2019. She thinks her condition started from working standing on concrete slabs for years. She didn't have major issues until last year. One day she went to get it checked out to see if there is something really wrong or her feet just hurt. She was told she has arthritis in her feet and there was a bone in her L ankle (apparently injured at a young age) and when the bone healed it healed wrong and was sticking out at the posterior medial malleolus, so during the surgery Dr. Logan Bores shaved it down. Prior to surgery she tried various things for her plantar fasciitis. She got two cortisone shots that worked for 1-2 weeks before the pain would come back worse. She also got shoe inserts that helped for about a week and then her foott continued to hurt. Eventually surgery was recommended. Dr. Logan Bores initially  thougth she could come out of the boot in 2 months, but also said to get back to normal would need to do physical therapy. She had to delay her next post-op appointment because she had not yet started PT. She was told to expect to be out of work 2 or more months. She at first said she thought she was supposed to use crutches the whole time she had the boot on. But she was instructed to put more weight through her foot at one point and is now unclear or unsure about crutch use. She is currently supposed to wear the CAM boot. She takes her boot off occasionally when  her child is not wild. Dr. Logan BoresEvans said it was okay to take boot off at night. She did this once and she woke up to throbbing pain. She does take off the boot sometimes and walk around with the crutches. It hurts some when she walks like this but it calms down after a while.  Does have a history of back pain, mostly localized in the back. Radiated to the glute on both sides (can make her feel like she cannot move) as recent as about a week ago but no more distal than that. Leg was numb 3-4 days post op and she does not want to take a nerve block for surgery again. Sometime last week during the rain. No hx of trauma to spine or surgery. Patient does report some tingling in the top of her left toes 1-3 at times.    Pertinent History Patient is a 34 y.o. female who presents to outpatient physical therapy with a referral for medical diagnosis left plantar fasciitis, s/p left foot surgery (endoscopic plantar fasciotomy and tarsal exostectomy 10/07/19). This patient's chief complaints consist of left foot and ankle pain, stiffness, poor weight bearing tolerance leading to the following functional deficits: difficulty with all weight bearing activities including household and community ambulation, [place], [place], visiting friends, social activities, playing for and caring for children, standing, working, English as a second language teacherbowling.. Relevant past medical history and comorbidities include severe uterine prolapse (sx planned soon, no lifting over 15 lbs), hernia repair in the last year, current smoker, cholecystectomy, GERD, headache, neck pain, asthma .  Patient denies hx of cancer, stroke, seizures, lung problem, major cardiac events, diabetes, unexplained weight loss, changes in bowel or bladder problems.    Limitations Standing;Walking;House hold activities;Other (comment)   work, anything that requires weight bearing, bowling   Diagnostic tests Radiograph 10/15/19: "Osteotomies sites appear to be stable with routine healing"     Patient Stated Goals to get better    Currently in Pain? Yes    Pain Score 4    worst: 01-30-09/10; Best: 4/10   Pain Location Ankle    Pain Orientation Left   near incision sites and tingling over dorsal aspect of foot and first three toes at times   Pain Descriptors / Indicators Burning;Aching;Other (Comment);Tingling   throbbing   Pain Type Surgical pain;Chronic pain    Pain Radiating Towards intermittant tingling over dorsal medial aspect of foot and first three toes    Pain Onset More than a month ago    Pain Frequency Constant    Aggravating Factors  more activity, weight bearing    Pain Relieving Factors moving foot around in the boot, getting out of weight bearing, ice pack    Effect of Pain on Daily Activities Functional Limitations: household and community ambulation, [place], stairs, visiting friends, social activities,  playing for and caring for children, standing, working, bowling.              [facility] PT Assessment - 12/09/19 0001      Assessment   Medical Diagnosis left plantar fasciitis, s/p left foot surgery (endoscopic plantar fasciotomy and tarsal exostectomy 10/07/19)    Referring Provider (PT) Edrick Kins, DPM    Onset Date/Surgical Date 10/07/19    Hand Dominance Left    Next MD Visit 12/31/2019      Precautions   Precautions Other (comment)    Precaution Comments unclear, but pt still in CAM boot, unsure of crutches.       Restrictions   Weight Bearing Restrictions --   unclear, appears to be WBAT in CAM boot possibly   Other Position/Activity Restrictions no lifting over 15 lbs due to uterine prolapse      Balance Screen   Has the patient fallen in the past 6 months No    Has the patient had a decrease in activity level because of a fear of falling?  No    Is the patient reluctant to leave their home because of a fear of falling?  No      Home Environment   Living Environment Private residence    Living Arrangements Children   77 year old son    Type of Home Apartment    Home Access Level entry    Home Layout Two level    Alternate Level Stairs-Number of Steps 14    Alternate Level Stairs-Rails Right    Home Equipment Crutches   CAM boot     Prior Function   Level of Independence Independent    Vocation Full time employment    Vocation Requirements works at [employer] in [place] area (they want her to come back),, standing/walking entire shift, currently not working    Leisure 35 year old so full time and two other children with her 50% of time, family activities, go to [employer], bowling      Cognition   Overall Cognitive Status Within Functional Limits for tasks assessed      Observation/Other Assessments   Focus on Therapeutic Outcomes (FOTO)  54             OBJECTIVE  OBSERVATION/INSPECTION  Tremor: none  Muscle bulk: significant atrophy noted in left calf and less so in thigh.   Edema: mild edema present in left ankle region with more prominent edema at plantar surface of foot just distal to calcaneous.   Skin: The incision sites appear to be healing well with no excessive redness, warmth, drainage or signs of infection present.    Bed mobility: supine <> sit and rolling WFL with extra time to keep anything from touching foot.   Transfers: sit <> stand WFL while wearing CAM boot.   Gait: ambulated in to clinic wearing CAM boot with partial weight bearing using B axial crutches. Ambulated a few steps in the room and back out of clinic with CAM boot while carrying crutches with antalgic gait favoring the L LE.   Stairs: deferred  CIRCUMFERENCE MEASUREMENTS Ankle figure 8:  R = 53 cm L = 52 cm  Calf circumference (largest spot):  R = 39 cm L = 34 cm  NEUROLOGICAL Able to feel light touch over dorsal medial L foot.   PERIPHERAL JOINT MOTION (in degrees)  Active Range of Motion (AROM) *Indicates pain 12/09/19 Date Date  Joint/Motion R/L R/L R/L  Ankle/Foot  Dorsiflexion (knee flex) 10/-8* / /   Plantarflexion 60/30* / /  Everison 12/0* / /  Inversion 30/0* / /  Great toe extension 70/52* / /  Comments: Hip and knee ROM appear to be [facility] for basic tasks bilaterally.   Passive Range of Motion (PROM) *Indicates pain 12/09/19 Date Date  Joint/Motion R/L R/L R/L  Ankle/Foot     Dorsiflexion (knee flex) 10/ / /  Great toe extension 90/ / /  Comments: Some testing deferred due to hypersensitivity and apprehension with touch at the left ankle/foot. B hip and knees and R ankle/foot appear WFL for basic tasks  MUSCLE PERFORMANCE (MMT):  *Indicates pain 12/09/19 Date Date  Joint/Motion R/L R/L R/L  Hip     Flexion 5/5 / /  Extension (knee ext) 5/5 / /  Abduction 5/4+ / /  Adduction 4+/4 / /  Knee     Extension 5/4+ / /  Flexion 5/4** / /  Ankle/Foot     Dorsiflexion  / / /  Great toe extension / / /  Eversion / / /  Plantarflexion / / /  Inversion / / /  Pronation / / /  Great toe flexion / / /  Comments: R ankle WFL, L ankle deferred due to hypersensitivity and apprehension. Pain over contact point at left calf with knee flexion testing.   ACCESSORY MOTION:  - Deferred due to hypersensitivity and apprehension  PALPATION: - Patient highly apprehensive to light touch over incision sites and surrounding skin at the L ankle and foot.   EDUCATION/COGNITION: Patient is alert and oriented X 4.  Objective measurements completed on examination: See above findings.    TREATMENT:   Therapeutic exercise: to centralize symptoms and improve ROM, strength, muscular endurance, and activity tolerance required for successful completion of functional activities.  - seated L ankle AROM with alphabet (pt mostly moving from knee and toes).  - seated ankle pumps x 20 (improved ankle ROM) - education on importance of desensitization and spending time touching surgical site and surrounding regions frequently.  - Education on diagnosis, prognosis, POC, anatomy and physiology of current  condition.  - Education on HEP including handout    HOME EXERCISE PROGRAM  Access Code: [email or web address] URL: [email or web address] Date: 12/09/2019 Prepared by: Norton Blizzard  Exercises Seated Ankle Alphabet - 2 x daily - 2 sets Supine Single Leg Ankle Pumps - 2 x daily - 2 sets - 20 reps Towel Desensitization - 2 x daily     PT Education - 12/09/19 2024    Education Details Exercise purpose/form. Self management techniques. Education on diagnosis, prognosis, POC, anatomy and physiology of current condition Education on HEP including handout    Person(s) Educated Patient    Methods Explanation;Demonstration;Tactile cues;Verbal cues;Handout    Comprehension Verbalized understanding;Returned demonstration;Verbal cues required;Tactile cues required;Need further instruction            PT Short Term Goals - 12/09/19 2028      PT SHORT TERM GOAL #1   Title Be independent with initial home exercise program for self-management of symptoms.    Baseline Initial HEP provided at IE (12/09/2019);    Time 2    Period Weeks    Status New    Target Date 12/23/19             PT Long Term Goals - 12/09/19 2028      PT LONG TERM GOAL #1   Title Be independent with a long-term home exercise program for  self-management of symptoms.    Baseline Initial HEP provided at IE (12/09/2019);    Time 12    Period Weeks    Status New   TARGET DATE FOR ALL LONG TERM GOALS: 03/02/2020     PT LONG TERM GOAL #2   Title Demonstrate improved FOTO score to 67 or greater to demonstrate improvement in overall condition and self-reported functional ability.    Baseline 54 (12/09/2019);    Time 12    Period Weeks    Status New      PT LONG TERM GOAL #3   Title Patient will demonstrate L ankle PROM equal or greater than R to allow successfull return to ambulatory actiivties without boot or assistive device.    Baseline very limited compared to right (12/09/2019);    Time 12    Period Weeks     Status New      PT LONG TERM GOAL #4   Title Patient will demonstrate equal or greater than 4+/5 MMT left ankle strength to allow patient to ambulate with normal gait and complete work and functional activities that require weight bearing.    Baseline unable to test due to hypersensitivity and fear (12/09/2019);    Time 12    Period Weeks    Status New      PT LONG TERM GOAL #5   Title Complete community, work and/or recreational activities without limitation due to current condition.    Baseline difficulty with all weight bearing activities including household and community ambulation, going to [employer], stairs, visiting friends, social activities, playing for and caring for children, standing, working, bowling; currently not working (12/09/2019);    Time 12    Period Weeks    Status New                  Plan - 12/09/19 2048    Clinical Impression Statement Patient is a 34 y.o. female referred to outpatient physical therapy with a medical diagnosis of left plantar fasciitis, s/p left foot surgery (endoscopic plantar fasciotomy and tarsal exostectomy 10/07/19) who presents with signs and symptoms consistent with pain, stiffness, hypersensitivity, weakness, and abnormal gait following left endoscopic plantar fasciotomy and tarsal exostectomy on 10/07/2019 and chronic L plantar fasciopathy. Patient presents with significant pain, hyperalgesia, allodynia, joint stiffness, muscle performance (strength/power/endurance), motor control, balance, gait, sensory, proprioceptive, and fascial impairments that are limiting ability to complete usual activities including all weight bearing activities such as household and community ambulation, going to [employer], stairs, visiting friends, social activities, playing for and caring for children, standing, working, and bowling without difficulty. Patient will benefit from skilled physical therapy intervention to address current body structure impairments and  activity limitations to improve function and work towards goals set in current POC in order to return to prior level of function or maximal functional improvement.    Personal Factors and Comorbidities Age;Behavior Pattern;Comorbidity 3+    Comorbidities Relevant past medical history and comorbidities include severe uterine prolapse (sx planned soon, no lifting over 15 lbs), hernia repair in the last year, current smoker, cholecystectomy, GERD, headache, neck pain, asthma .    Examination-Activity Limitations Bed Mobility;Bathing;Lift;Stand;Locomotion Level;Toileting;Bend;Transfers;Caring for Others;Carry;Sit;Dressing;Squat;Hygiene/Grooming;Stairs    Examination-Participation Restrictions Yard Work;Cleaning;Meal Prep;Community Activity;Driving;Interpersonal Relationship;Laundry;Volunteer;Other   work, English as a second language teacher, social participation, playing with and caring for children   Stability/Clinical Decision Making Evolving/Moderate complexity    Clinical Decision Making Moderate    Rehab Potential Good    PT Frequency 2x / week    PT  Duration 12 weeks    PT Treatment/Interventions ADLs/Self Care Home Management;Aquatic Therapy;Biofeedback;Cryotherapy;Electrical Stimulation;Moist Heat;DME Instruction;Gait training;Stair training;Functional mobility training;Therapeutic activities;Therapeutic exercise;Balance training;Neuromuscular re-education;Patient/family education;Orthotic Fit/Training;Manual techniques;Compression bandaging;Scar mobilization;Passive range of motion;Dry needling;Splinting;Visual/perceptual remediation/compensation;Spinal Manipulations;Taping;Joint Manipulations    PT Next Visit Plan update HEP, intrinsic foot exercises, ROM, strengthening as tolerated and within known surgeon's precautions    PT Home Exercise Plan Medbridge Access Code: [email or web address]    Consulted and Agree with Plan of Care Patient           Patient will benefit from skilled therapeutic intervention in order to improve the  following deficits and impairments:  Abnormal gait, Decreased skin integrity, Decreased knowledge of use of DME, Increased fascial restricitons, Pain, Improper body mechanics, Decreased scar mobility, Decreased mobility, Decreased coordination, Decreased activity tolerance, Decreased endurance, Decreased range of motion, Decreased strength, Hypomobility, Impaired perceived functional ability, Impaired flexibility, Increased edema, Difficulty walking, Decreased balance  Visit Diagnosis: Pain in left ankle and joints of left foot  Stiffness of left ankle, not elsewhere classified  Muscle weakness (generalized)  Difficulty in walking, not elsewhere classified     Problem List Patient Active Problem List   Diagnosis Date Noted   Incisional hernia, without obstruction or gangrene 02/21/2019   Rectus diastasis 02/21/2019   Ectopic pregnancy 06/20/2017   Post-op pain 06/20/2017   Cesarean delivery, delivered, current hospitalization 08/12/2016   Term birth of newborn female 02/13/2011   Luretha Murphy. Ilsa Iha, PT, DPT 12/09/19, 8:53 PM  [facility] PHYS[facility] SPORTS MEDICINE [address], [place], [address] Phone: [phone]   Fax:  [phone]  Name: MORGHAN KESTER MRN: [id number] Date of Birth: 20-Mar-1986

## 2019-12-16 ENCOUNTER — Encounter: Payer: Self-pay | Admitting: Physical Therapy

## 2019-12-16 ENCOUNTER — Other Ambulatory Visit: Payer: Self-pay

## 2019-12-16 ENCOUNTER — Ambulatory Visit: Payer: Medicaid Other | Admitting: Physical Therapy

## 2019-12-16 DIAGNOSIS — R262 Difficulty in walking, not elsewhere classified: Secondary | ICD-10-CM

## 2019-12-16 DIAGNOSIS — M25572 Pain in left ankle and joints of left foot: Secondary | ICD-10-CM | POA: Diagnosis not present

## 2019-12-16 DIAGNOSIS — M25672 Stiffness of left ankle, not elsewhere classified: Secondary | ICD-10-CM

## 2019-12-16 DIAGNOSIS — M6281 Muscle weakness (generalized): Secondary | ICD-10-CM

## 2019-12-16 NOTE — Therapy (Signed)
[facility] [address] [place], [address] Phone: [phone]   Fax:  [phone]  Physical Therapy Treatment  Patient Details  Name: Christy Jones MRN: [id number] Date of Birth: 1985-12-11 Referring Provider (PT): Felecia Shelling, [place]   Encounter Date: 12/16/2019   PT End of Session - 12/16/19 1104    Visit Number 2    Number of Visits 18    Date for PT Re-Evaluation 03/02/20    Authorization Type Medicaid reporting period from 12/09/2019    Authorization Time Period [employer] auth 3 PT visits 12/16/19 - 12/25/19    Authorization - Visit Number 1    Authorization - Number of Visits 3    Progress Note Due on Visit 4    PT Start Time 1045    PT Stop Time 1115    PT Time Calculation (min) 30 min    Activity Tolerance Patient tolerated treatment well;Patient limited by pain    Behavior During Therapy [facility] for tasks assessed/performed;Anxious           Past Medical History:  Diagnosis Date  . Asthma   . Chlamydia   . GERD (gastroesophageal reflux disease)   . Gestational diabetes    with pregnancy x2  . Headache(784.0)   . Pregnancy induced hypertension    post- partem 2012  . Sickle cell trait (HCC)   . Trichomonas   . Umbilical hernia   . Urinary tract infection     Past Surgical History:  Procedure Laterality Date  . CESAREAN SECTION N/A 08/11/2016   Procedure: CESAREAN SECTION;  Surgeon: Waynard Reeds, MD;  Location: [facility] BIRTHING SU[facility];  Service: Obstetrics;  Laterality: N/A;  . CHOLECYSTECTOMY N/A 07/30/2012   Procedure: LAPAROSCOPIC CHOLECYSTECTOMY WITH INTRAOPERATIVE CHOLANGIOGRAM;  Surgeon: Ernestene Mention, MD;  Location: [facility] OR;  Service: General;  Laterality: N/A;  . DIAGNOSTIC LAPAROSCOPY WITH REMOVAL OF ECTOPIC PREGNANCY N/A 06/20/2017   Procedure: DIAGNOSTIC LAPAROSCOPY WITH REMOVAL OF ECTOPIC PREGNANCY;  Surgeon: Willodean Rosenthal, MD;  Location: [facility] ORS;  Service: Gynecology;   Laterality: N/A;  . EXPLORATORY LAPAROTOMY     removal of ectopic pregnancy  . INCISIONAL HERNIA REPAIR N/A 02/25/2019   Procedure: OPEN INCISIONAL HERNIA REPAIR WITH MESH PATCH;  Surgeon: Darnell Level, MD;  Location: [facility];  Service: General;  Laterality: N/A;    There were no vitals filed for this visit.   Subjective Assessment - 12/16/19 1047    Subjective Patient reports she has 5/10 pain upon arrival in the L medial ankle upon arrival. Is wearing her CAM boot but no crutches today. States she was very sore followign her last PT session and is sore for hours after the alphabet exercise. Ankle pumps is not as bad.    Pertinent History Patient is a 34 y.o. female who presents to outpatient physical therapy with a referral for medical diagnosis left plantar fasciitis, s/p left foot surgery (endoscopic plantar fasciotomy and tarsal exostectomy 10/07/19). This patient's chief complaints consist of left foot and ankle pain, stiffness, poor weight bearing tolerance leading to the following functional deficits: difficulty with all weight bearing activities including household and community ambulation, going to [employer], stairs, visiting friends, social activities, playing for and caring for children, standing, working, English as a second language teacher.. Relevant past medical history and comorbidities include severe uterine prolapse (sx planned soon, no lifting over 15 lbs), hernia repair in the last year, current smoker, cholecystectomy, GERD, headache, neck pain, asthma .  Patient  denies hx of cancer, stroke, seizures, lung problem, major cardiac events, diabetes, unexplained weight loss, changes in bowel or bladder problems.    Limitations Standing;Walking;House hold activities;Other (comment)   work, anything that requires weight bearing, bowling   Diagnostic tests Radiograph 10/15/19: "Osteotomies sites appear to be stable with routine healing"    Patient Stated Goals to get better    Currently in Pain? Yes     Pain Score 5     Pain Location Ankle    Pain Orientation Left    Pain Onset More than a month ago             TREATMENT:   Therapeutic exercise:to centralize symptoms and improve ROM, strength, muscular endurance, and activity tolerance required for successful completion of functional activities. - NuStep level 1-3 using bilateral lower extremities. Seat setting 9. For improved extremity mobility, muscular endurance, and activity tolerance; and to induce the analgesic effect of aerobic exercise, stimulate improved joint nutrition, and prepare body structures and systems for following interventions. x 10  Minutes during subjective exam.  - sit <> stand with 3/4inch platform under R LE to improve L quad strength. 2x10, added 5# DB held in both hands as a counter weight to improve form. Patient very very hesitant at first but gained confidence with repetition.     Boot Doffed:  - seated ankle pumps x 20 (improved ankle ROM) - seated isometric L ankle PF, 3x45 seconds - seated isometric B ankle inversion pressing towel between forefeet. 2x45 seconds.  - Education on HEP including handout    HOME EXERCISE PROGRAM Access Code: [email or web address] URL: [email or web address] Date: 12/16/2019 Prepared by: Norton Blizzard  Exercises Supine Single Leg Ankle Pumps - 2 x daily - 2 sets - 20 reps Isometric Plantarflexion with Towel - 5 reps - 45 seconds hold - 1 minute time between sets Towel Desensitization - 2 x daily       PT Education - 12/16/19 1259    Education Details Exercise purpose/form. Self management techniques.    Person(s) Educated Patient    Methods Explanation;Demonstration;Verbal cues;Handout;Tactile cues    Comprehension Verbalized understanding;Returned demonstration;Verbal cues required;Tactile cues required;Need further instruction            PT Short Term Goals - 12/09/19 2028      PT SHORT TERM GOAL #1   Title Be independent with initial home exercise  program for self-management of symptoms.    Baseline Initial HEP provided at IE (12/09/2019);    Time 2    Period Weeks    Status New    Target Date 12/23/19             PT Long Term Goals - 12/09/19 2028      PT LONG TERM GOAL #1   Title Be independent with a long-term home exercise program for self-management of symptoms.    Baseline Initial HEP provided at IE (12/09/2019);    Time 12    Period Weeks    Status New   TARGET DATE FOR ALL LONG TERM GOALS: 03/02/2020     PT LONG TERM GOAL #2   Title Demonstrate improved FOTO score to 67 or greater to demonstrate improvement in overall condition and self-reported functional ability.    Baseline 54 (12/09/2019);    Time 12    Period Weeks    Status New      PT LONG TERM GOAL #3   Title Patient will demonstrate L ankle PROM equal or greater than R  to allow successfull return to ambulatory actiivties without boot or assistive device.    Baseline very limited compared to right (12/09/2019);    Time 12    Period Weeks    Status New      PT LONG TERM GOAL #4   Title Patient will demonstrate equal or greater than 4+/5 MMT left ankle strength to allow patient to ambulate with normal gait and complete work and functional activities that require weight bearing.    Baseline unable to test due to hypersensitivity and fear (12/09/2019);    Time 12    Period Weeks    Status New      PT LONG TERM GOAL #5   Title Complete community, work and/or recreational activities without limitation due to current condition.    Baseline difficulty with all weight bearing activities including household and community ambulation, going to [employer], stairs, visiting friends, social activities, playing for and caring for children, standing, working, bowling; currently not working (12/09/2019);    Time 12    Period Weeks    Status New                  Plan - 12/16/19 1305    Clinical Impression Statement Patient tolerated treatment well overall and  reports feeling slightly better at the end of the session. Patient came 15 min late and time was unable to be extended due to scheduling restrictions. Discontinued alphabet exercise due to excessive pain and started isometric PF and inversion that was better tolerated. Have not yet heard back from physician about when he is agreeable to weaning from boot.  Patient would benefit from continued management of limiting condition by skilled physical therapist to address remaining impairments and functional limitations to work towards stated goals and return to PLOF or maximal functional independence.    Personal Factors and Comorbidities Age;Behavior Pattern;Comorbidity 3+    Comorbidities Relevant past medical history and comorbidities include severe uterine prolapse (sx planned soon, no lifting over 15 lbs), hernia repair in the last year, current smoker, cholecystectomy, GERD, headache, neck pain, asthma .    Examination-Activity Limitations Bed Mobility;Bathing;Lift;Stand;Locomotion Level;Toileting;Bend;Transfers;Caring for Others;Carry;Sit;Dressing;Squat;Hygiene/Grooming;Stairs    Examination-Participation Restrictions Yard Work;Cleaning;Meal Prep;Community Activity;Driving;Interpersonal Relationship;Laundry;Volunteer;Other   work, English as a second language teacher, social participation, playing with and caring for children   Stability/Clinical Decision Making Evolving/Moderate complexity    Rehab Potential Good    PT Frequency 2x / week    PT Duration 12 weeks    PT Treatment/Interventions ADLs/Self Care Home Management;Aquatic Therapy;Biofeedback;Cryotherapy;Electrical Stimulation;Moist Heat;DME Instruction;Gait training;Stair training;Functional mobility training;Therapeutic activities;Therapeutic exercise;Balance training;Neuromuscular re-education;Patient/family education;Orthotic Fit/Training;Manual techniques;Compression bandaging;Scar mobilization;Passive range of motion;Dry needling;Splinting;Visual/perceptual  remediation/compensation;Spinal Manipulations;Taping;Joint Manipulations    PT Next Visit Plan intrinsic foot exercises, ROM, strengthening as tolerated and within known surgeon's precautions    PT Home Exercise Plan Medbridge Access Code: [email or web address]    Consulted and Agree with Plan of Care Patient           Patient will benefit from skilled therapeutic intervention in order to improve the following deficits and impairments:  Abnormal gait, Decreased skin integrity, Decreased knowledge of use of DME, Increased fascial restricitons, Pain, Improper body mechanics, Decreased scar mobility, Decreased mobility, Decreased coordination, Decreased activity tolerance, Decreased endurance, Decreased range of motion, Decreased strength, Hypomobility, Impaired perceived functional ability, Impaired flexibility, Increased edema, Difficulty walking, Decreased balance  Visit Diagnosis: Pain in left ankle and joints of left foot  Stiffness of left ankle, not elsewhere classified  Muscle weakness (generalized)  Difficulty in walking, not elsewhere  classified     Problem List Patient Active Problem List   Diagnosis Date Noted  . Incisional hernia, without obstruction or gangrene 02/21/2019  . Rectus diastasis 02/21/2019  . Ectopic pregnancy 06/20/2017  . Post-op pain 06/20/2017  . Cesarean delivery, delivered, current hospitalization 08/12/2016  . Term birth of newborn female 02/13/2011    Everlean Alstrom. Graylon Good, PT, DPT 12/16/19, 1:06 PM  [place] [facility] [address], [place], [address] Phone: [phone]   Fax:  [phone]  Name: ELLANORA RAYBORN MRN: [id number] Date of Birth: [birth year]

## 2019-12-20 ENCOUNTER — Other Ambulatory Visit: Payer: Self-pay

## 2019-12-20 ENCOUNTER — Ambulatory Visit: Payer: Medicaid Other | Admitting: Physical Therapy

## 2019-12-20 ENCOUNTER — Encounter: Payer: Self-pay | Admitting: Physical Therapy

## 2019-12-20 DIAGNOSIS — M25672 Stiffness of left ankle, not elsewhere classified: Secondary | ICD-10-CM

## 2019-12-20 DIAGNOSIS — R262 Difficulty in walking, not elsewhere classified: Secondary | ICD-10-CM

## 2019-12-20 DIAGNOSIS — M6281 Muscle weakness (generalized): Secondary | ICD-10-CM

## 2019-12-20 DIAGNOSIS — M25572 Pain in left ankle and joints of left foot: Secondary | ICD-10-CM

## 2019-12-20 NOTE — Therapy (Signed)
[facility] [address] [place], [address] Phone: [phone]   Fax:  [phone]  Physical Therapy Treatment  Patient Details  Name: Christy Jones MRN: [id number] Date of Birth: 1985-08-27 Referring Provider (PT): Edrick Kins, [place]   Encounter Date: 12/20/2019   PT End of Session - 12/21/19 1802    Visit Number 3    Number of Visits 18    Date for PT Re-Evaluation 03/02/20    Authorization Type Medicaid reporting period from 12/09/2019    Authorization Time Period [employer] auth 3 PT visits 12/16/19 - 12/25/19    Authorization - Visit Number 2    Authorization - Number of Visits 3    Progress Note Due on Visit 4    PT Start Time 9678    PT Stop Time 1210    PT Time Calculation (min) 47 min    Activity Tolerance Patient tolerated treatment well;Patient limited by pain    Behavior During Therapy [facility] for tasks assessed/performed;Anxious           Past Medical History:  Diagnosis Date  . Asthma   . Chlamydia   . GERD (gastroesophageal reflux disease)   . Gestational diabetes    with pregnancy x2  . Headache(784.0)   . Pregnancy induced hypertension    post- partem 2012  . Sickle cell trait ([place])   . Trichomonas   . Umbilical hernia   . Urinary tract infection     Past Surgical History:  Procedure Laterality Date  . CESAREAN SECTION N/A 08/11/2016   Procedure: CESAREAN SECTION;  Surgeon: Vanessa Kick, MD;  Location: [facility];  Service: Obstetrics;  Laterality: N/A;  . CHOLECYSTECTOMY N/A 07/30/2012   Procedure: LAPAROSCOPIC CHOLECYSTECTOMY WITH INTRAOPERATIVE CHOLANGIOGRAM;  Surgeon: Adin Hector, MD;  Location: [facility];  Service: General;  Laterality: N/A;  . DIAGNOSTIC LAPAROSCOPY WITH REMOVAL OF ECTOPIC PREGNANCY N/A 06/20/2017   Procedure: DIAGNOSTIC LAPAROSCOPY WITH REMOVAL OF ECTOPIC PREGNANCY;  Surgeon: Lavonia Drafts, MD;  Location: [facility];  Service: Gynecology;   Laterality: N/A;  . EXPLORATORY LAPAROTOMY     removal of ectopic pregnancy  . INCISIONAL HERNIA REPAIR N/A 02/25/2019   Procedure: OPEN INCISIONAL HERNIA REPAIR WITH MESH PATCH;  Surgeon: Armandina Gemma, MD;  Location: [facility];  Service: General;  Laterality: N/A;    There were no vitals filed for this visit.   Subjective Assessment - 12/20/19 1126    Subjective Pateint reports she is feeling well today and was able to sleep without her boot last night. She was able to sleep well and it felt good. She was able to put the boot back on without extremem discomfort she has had in the past. Reports her pain is in the left plantar surface of the foot just distal to the calcaneus 8/10. States she thinks her exercises are going well. Reports she continues to take a few steps out of the boot and is really lookoing forward to getting it off. Have not heard back from attempts to contact physician about when he would prefer to wean out of the boot. Patient apologized for being late last visit. She thought her appointment was 15 min after her scheduled time and didn't realize she was late until the visit ended.    Pertinent History Patient is a 34 y.o. female who presents to outpatient physical therapy with a referral for medical diagnosis left plantar fasciitis, s/p left foot surgery (endoscopic plantar  fasciotomy and tarsal exostectomy 10/07/19). This patient's chief complaints consist of left foot and ankle pain, stiffness, poor weight bearing tolerance leading to the following functional deficits: difficulty with all weight bearing activities including household and community ambulation, going to [employer], stairs, visiting friends, social activities, playing for and caring for children, standing, working, Environmental consultant.. Relevant past medical history and comorbidities include severe uterine prolapse (sx planned soon, no lifting over 15 lbs), hernia repair in the last year, current smoker,  cholecystectomy, GERD, headache, neck pain, asthma .  Patient denies hx of cancer, stroke, seizures, lung problem, major cardiac events, diabetes, unexplained weight loss, changes in bowel or bladder problems.    Limitations Standing;Walking;House hold activities;Other (comment)   work, anything that requires weight bearing, bowling   Diagnostic tests Radiograph 10/15/19: "Osteotomies sites appear to be stable with routine healing"    Patient Stated Goals to get better    Currently in Pain? Yes    Pain Score 8     Pain Onset More than a month ago           TREATMENT:  Therapeutic exercise:to centralize symptoms and improve ROM, strength, muscular endurance, and activity tolerance required for successful completion of functional activities. - NuStep level 3 using bilateral  Upper and lower extremities. Seat/handle setting 9. For improved extremity mobility, muscular endurance, and activity tolerance; and to induce the analgesic effect of aerobic exercise, stimulate improved joint nutrition, and prepare body structures and systems for following interventions. x 5  Minutes during subjective exam.  77 spm.   Boot Doffed:  - sit <> stand from 18.5 plinth to introduce functional weight bearing through L LE and continue with LE strength. 2x15.  - seated L great toe extension stretch with legs in figure 4 position, instructed to go to end range strain, hold 5 seconds and release. X 10. Patient reports discomfort and pulling at end range that is no worse following.  - Toe Yoga: great toe extension with small toes flexion pressure into floor, repeated 2 sec holds; small toes extension with great toe flexion pressure into floor, repeated 3 sec holds. Ball of foot and heel maintains contact with floor. To improve intrinsic foot muscle activation and strength in order to better support arch and intrinsic foot structures.  - Toe splays, repeated 2 sec holds. Ball of foot and heel maintains contact with  floor. To improve intrinsic foot muscle activation and strength in order to better support arch and intrinsic foot structures.  - Seated left ankle BAPS board, level 2, plantarflexion/dorsiflexion, inversion/eversion, circles clockwise, circles counter clockwise, x20 reps each plus time for instruction and transitions. Pt required cuing to keep knee still and instruction on how to perform exercise.  - Education on HEP including handout   HOME EXERCISE PROGRAM Access Code: [email or web address] URL: [email or web address] Date: 12/20/2019 Prepared by: [name]  Exercises Isometric Plantarflexion with Towel - 5 reps - 45 seconds hold - 1 minute time between sets Seated Heel Toe Raises - 2 x daily - 2 sets - 20 reps Towel Desensitization - 2 x daily Seated Great Toe Extension - 2 x daily - 2 sets - 20 reps - 2 seconds hold - 2 minutes time Seated Lesser Toes Extension - 2 x daily - 2 sets - 20 reps - 2 seconds hold - 2 minutes time Toe Spreading - 2 x daily - 2 sets - 20 reps - 2 seconds hold - time 2 minutes Seated Self Great Toe Stretch - 2 x  daily - 10 reps - 5 seconds hold Sit to Stand without Arm Support - 1 x daily - 2 sets - 15 reps      PT Education - 12/20/19 1128    Education Details Exercise purpose/form. Self management techniques.    Person(s) Educated Patient    Methods Explanation;Demonstration;Tactile cues;Verbal cues    Comprehension Verbalized understanding;Returned demonstration;Verbal cues required;Tactile cues required;Need further instruction            PT Short Term Goals - 12/21/19 1809      PT SHORT TERM GOAL #1   Title Be independent with initial home exercise program for self-management of symptoms.    Baseline Initial HEP provided at [facility] (12/09/2019);    Time 2    Period Weeks    Status Achieved    Target Date 12/23/19             PT Long Term Goals - 12/21/19 1809      PT LONG TERM GOAL #1   Title Be independent with a long-term home  exercise program for self-management of symptoms.    Baseline Initial HEP provided at [facility] (12/09/2019); currently participating in recovery appropriate HEP (12/20/2019);    Time 12    Period Weeks    Status Partially Met   TARGET DATE FOR ALL LONG TERM GOALS: 03/02/2020     PT LONG TERM GOAL #2   Title Demonstrate improved FOTO score to 67 or greater to demonstrate improvement in overall condition and self-reported functional ability.    Baseline 54 (12/09/2019); too early to measure reliably (12/20/2019);    Time 12    Period Weeks    Status On-going      PT LONG TERM GOAL #3   Title Patient will demonstrate L ankle PROM equal or greater than R to allow successfull return to ambulatory actiivties without boot or assistive device.    Baseline very limited compared to right (12/09/2019); improving per observation (12/20/2019);    Time 12    Period Weeks    Status Partially Met      PT LONG TERM GOAL #4   Title Patient will demonstrate equal or greater than 4+/5 MMT left ankle strength to allow patient to ambulate with normal gait and complete work and functional activities that require weight bearing.    Baseline unable to test due to hypersensitivity and fear (12/09/2019); improving tolerance to isometric exercise (12/20/2019);    Time 12    Period Weeks    Status New      PT LONG TERM GOAL #5   Title Complete community, work and/or recreational activities without limitation due to current condition.    Baseline difficulty with all weight bearing activities including household and community ambulation, going to [employer], stairs, visiting friends, social activities, playing for and caring for children, standing, working, bowling; currently not working (12/09/2019); continues to have similar limitations but is progressing appropriately at this point (12/20/2019);    Time 12    Period Weeks    Status On-going                 Plan - 12/21/19 1809    Clinical Impression Statement Patient  has attended 3 physical therapy sessions and is making appropriate progress towards goals at this point. She has demonstrated good attendance except being slightly late for one appointment due to misunderstanding of time. She demonstrates improving activity tolerance, decreased anxiety about moving and touching foot/ankle resulting in improved ROM. Patient has  not yet returned to prior level of function and requires continued PT as outlined in original plan of care.  Patient presents with continued significant pain, hyperalgesia, allodynia, joint stiffness, muscle performance (strength/power/endurance), motor control, balance, gait, sensory, proprioceptive, and fascial impairments that are limiting ability to complete usual activities including all weight bearing activities such as household and community ambulation, going to [employer], stairs, visiting friends, social activities, playing for and caring for children, standing, working, and bowling without difficulty. Patient will benefit from skilled physical therapy intervention to address current body structure impairments and activity limitations to improve function and work towards goals set in current POC in order to return to prior level of function or maximal functional improvement.    Personal Factors and Comorbidities Age;Behavior Pattern;Comorbidity 3+    Comorbidities Relevant past medical history and comorbidities include severe uterine prolapse (sx planned soon, no lifting over 15 lbs), hernia repair in the last year, current smoker, cholecystectomy, GERD, headache, neck pain, asthma .    Examination-Activity Limitations Bed Mobility;Bathing;Lift;Stand;Locomotion Level;Toileting;Bend;Transfers;Caring for Others;Carry;Sit;Dressing;Squat;Hygiene/Grooming;Stairs    Examination-Participation Restrictions Yard Work;Cleaning;Meal Prep;Community Activity;Driving;Interpersonal Relationship;Laundry;Volunteer;Other   work, Environmental consultant, social participation,  playing with and caring for children   Stability/Clinical Decision Making Evolving/Moderate complexity    Rehab Potential Good    PT Frequency 2x / week    PT Duration 12 weeks    PT Treatment/Interventions ADLs/Self Care Home Management;Aquatic Therapy;Biofeedback;Cryotherapy;Electrical Stimulation;Moist Heat;DME Instruction;Gait training;Stair training;Functional mobility training;Therapeutic activities;Therapeutic exercise;Balance training;Neuromuscular re-education;Patient/family education;Orthotic Fit/Training;Manual techniques;Compression bandaging;Scar mobilization;Passive range of motion;Dry needling;Splinting;Visual/perceptual remediation/compensation;Spinal Manipulations;Taping;Joint Manipulations    PT Next Visit Plan intrinsic foot exercises, ROM, strengthening as tolerated and within known surgeon's precautions    PT Home Exercise Plan Medbridge Access Code: [email or web address]    Consulted and Agree with Plan of Care Patient           Patient will benefit from skilled therapeutic intervention in order to improve the following deficits and impairments:  Abnormal gait, Decreased skin integrity, Decreased knowledge of use of DME, Increased fascial restricitons, Pain, Improper body mechanics, Decreased scar mobility, Decreased mobility, Decreased coordination, Decreased activity tolerance, Decreased endurance, Decreased range of motion, Decreased strength, Hypomobility, Impaired perceived functional ability, Impaired flexibility, Increased edema, Difficulty walking, Decreased balance  Visit Diagnosis: Pain in left ankle and joints of left foot  Stiffness of left ankle, not elsewhere classified  Muscle weakness (generalized)  Difficulty in walking, not elsewhere classified     Problem List Patient Active Problem List   Diagnosis Date Noted  . Incisional hernia, without obstruction or gangrene 02/21/2019  . Rectus diastasis 02/21/2019  . Ectopic pregnancy 06/20/2017  . Post-op pain  06/20/2017  . Cesarean delivery, delivered, current hospitalization 08/12/2016  . Term birth of newborn female 02/13/2011    Everlean Alstrom. Graylon Good, PT, DPT 12/21/19, 6:11 PM  [facility] [address] [place], [address] Phone: [phone]   Fax:  [phone]  Name: Christy Jones MRN: [id number] Date of Birth: [birth year]

## 2019-12-21 ENCOUNTER — Telehealth: Payer: Self-pay | Admitting: *Deleted

## 2019-12-21 NOTE — Telephone Encounter (Signed)
"  I'm trying to get hold of Dr. Logan Bores about Christy Jones.  I'm seeing her for rehab after a surgery that Dr. Logan Bores did.  She's still in the boot.  I can't tell when he wants her out of the boot.  I want to start transitioning her if I can.  I called last Thursday and haven't been able to get through or get a call back.  I tried sending a message as well.  Please give me a call back and let me know about that."

## 2019-12-21 NOTE — Telephone Encounter (Signed)
Patient can be full weight bearing. Transition out of CAM boot beginning today, 12/21/2019.

## 2019-12-22 ENCOUNTER — Other Ambulatory Visit: Payer: Self-pay

## 2019-12-22 ENCOUNTER — Encounter: Payer: Self-pay | Admitting: Physical Therapy

## 2019-12-22 ENCOUNTER — Ambulatory Visit: Payer: Medicaid Other | Admitting: Physical Therapy

## 2019-12-22 DIAGNOSIS — M6281 Muscle weakness (generalized): Secondary | ICD-10-CM

## 2019-12-22 DIAGNOSIS — R262 Difficulty in walking, not elsewhere classified: Secondary | ICD-10-CM

## 2019-12-22 DIAGNOSIS — M25572 Pain in left ankle and joints of left foot: Secondary | ICD-10-CM | POA: Diagnosis not present

## 2019-12-22 DIAGNOSIS — M25672 Stiffness of left ankle, not elsewhere classified: Secondary | ICD-10-CM

## 2019-12-22 NOTE — Therapy (Signed)
[facility] [address] [place], [address] Phone: [phone]   Fax:  [phone]  Physical Therapy Treatment / Progress Note Reporting Period: 12/09/2019 - 12/22/2019  Patient Details  Name: Christy Jones MRN: [id number] Date of Birth: 1985-06-29 Referring Provider (PT): Edrick Kins, [place]   Encounter Date: 12/22/2019   PT End of Session - 12/22/19 1316    Visit Number 4    Number of Visits 18    Date for PT Re-Evaluation 03/02/20    Authorization Type Medicaid reporting period from 12/09/2019    Authorization Time Period [employer] auth 3 PT visits 12/16/19 - 12/25/19    Authorization - Visit Number 3    Authorization - Number of Visits 3    Progress Note Due on Visit 4    PT Start Time 1300    PT Stop Time 1340    PT Time Calculation (min) 40 min    Activity Tolerance Patient tolerated treatment well;Patient limited by pain    Behavior During Therapy [facility] for tasks assessed/performed;Anxious           Past Medical History:  Diagnosis Date  . Asthma   . Chlamydia   . GERD (gastroesophageal reflux disease)   . Gestational diabetes    with pregnancy x2  . Headache(784.0)   . Pregnancy induced hypertension    post- partem 2012  . Sickle cell trait ([place])   . Trichomonas   . Umbilical hernia   . Urinary tract infection     Past Surgical History:  Procedure Laterality Date  . CESAREAN SECTION N/A 08/11/2016   Procedure: CESAREAN SECTION;  Surgeon: Vanessa Kick, MD;  Location: [facility];  Service: Obstetrics;  Laterality: N/A;  . CHOLECYSTECTOMY N/A 07/30/2012   Procedure: LAPAROSCOPIC CHOLECYSTECTOMY WITH INTRAOPERATIVE CHOLANGIOGRAM;  Surgeon: Adin Hector, MD;  Location: [facility];  Service: General;  Laterality: N/A;  . DIAGNOSTIC LAPAROSCOPY WITH REMOVAL OF ECTOPIC PREGNANCY N/A 06/20/2017   Procedure: DIAGNOSTIC LAPAROSCOPY WITH REMOVAL OF ECTOPIC PREGNANCY;  Surgeon: Lavonia Drafts,  MD;  Location: [facility];  Service: Gynecology;  Laterality: N/A;  . EXPLORATORY LAPAROTOMY     removal of ectopic pregnancy  . INCISIONAL HERNIA REPAIR N/A 02/25/2019   Procedure: OPEN INCISIONAL HERNIA REPAIR WITH MESH PATCH;  Surgeon: Armandina Gemma, MD;  Location: [facility];  Service: General;  Laterality: N/A;    There were no vitals filed for this visit.   Subjective Assessment - 12/22/19 1304    Subjective Patient reports she is feeling well today. Communication in chart on 12/21/2019 from Dr. [name] states: "Patient can be full weight bearing. Transition out of CAM boot beginning today, 12/21/2019" Patient reports no pain upon arrival and that she felt some discomfort with the BAPS board last session but it did not last after. She has started walking around wtihout her boot this morning. She has a soft boot that she sometimes uses that so she is not always in her slides.    Pertinent History Patient is a 34 y.o. female who presents to outpatient physical therapy with a referral for medical diagnosis left plantar fasciitis, s/p left foot surgery (endoscopic plantar fasciotomy and tarsal exostectomy 10/07/19). This patient's chief complaints consist of left foot and ankle pain, stiffness, poor weight bearing tolerance leading to the following functional deficits: difficulty with all weight bearing activities including household and community ambulation, going to [employer], stairs, visiting friends, social activities, playing for and  caring for children, standing, working, bowling.. Relevant past medical history and comorbidities include severe uterine prolapse (sx planned soon, no lifting over 15 lbs), hernia repair in the last year, current smoker, cholecystectomy, GERD, headache, neck pain, asthma .  Patient denies hx of cancer, stroke, seizures, lung problem, major cardiac events, diabetes, unexplained weight loss, changes in bowel or bladder problems.    Limitations  Standing;Walking;House hold activities;Other (comment)   work, anything that requires weight bearing, bowling   Diagnostic tests Radiograph 10/15/19: "Osteotomies sites appear to be stable with routine healing"    Patient Stated Goals to get better    Currently in Pain? No/denies    Pain Onset More than a month ago          OBJECTIVE:  s/p left foot surgery 10/07/2019 (endoscopic plantar fasciotomy and tarsal exostectomy - along medial malleolous)  12/21/2019 (from Dr. [name]): "Patient can be full weight bearing. Transition out of CAM boot beginning today, 12/21/2019"  TREATMENT:  Therapeutic exercise:to centralize symptoms and improve ROM, strength, muscular endurance, and activity tolerance required for successful completion of functional activities.  Boot Doffed/shoe donned:  -NuStep level4using bilateral  Upper and lower extremities. Seat/handle setting 9. Very slow at first due to hurting in dorsiflexion (modifed stirrup to keep foot more forward).  For improved extremity mobility, muscular endurance, and activity tolerance; and to induce the analgesic effect of aerobic exercise, stimulate improved joint nutrition, and prepare body structures and systems for following interventions. x6Minutes during subjective exam. 56 spm.   Shoe/Boot Doffed:  Standing at TM bars with B UE support:  - isometric B heel raise (minimal lift off the floor). 5x45 seconds (first three with shoe on). Painful during but subsides with rest.  - sit <> stand x1 rep at 18 inch chair.   Seated:  - Toe Yoga: great toe extension with small toes flexion pressure into floor, repeated 2 sec holds; small toes extension with great toe flexion pressure into floor, repeated 2 sec holds. Ball of foot and heel maintains contact with floor. To improve intrinsic foot muscle activation and strength in order to better support arch and intrinsic foot structures. X 10 each direction.  - arch raise/short foot x 20 both  sides  Standing:  - arch raise/short foot x 10 both sides with extensive cuing to connect to motor control needed to perform motion.    - Education on HEP including handout   Gait training: to improve gait mechanics and ambulation ability. B shoes donned.  - forward and backward stepping with other foot on ground and unilateral UE support. x10 each side to improve gait.  - ambulation 2x20 feet with focus on improved gait mechanics.   HOME EXERCISE PROGRAM Access Code: [email or web address] URL: [email or web address] Date: 12/22/2019 Prepared by: [name]  Exercises Isometric Heel Raise at Wall - 1 x daily - 5 reps - 45 seconds hold Seated Heel Toe Raises - 2 x daily - 2 sets - 20 reps Towel Desensitization - 2 x daily Seated Great Toe Extension - 2 x daily - 2 sets - 20 reps - 2 seconds hold - 2 minutes time Seated Lesser Toes Extension - 2 x daily - 2 sets - 20 reps - 2 seconds hold - 2 minutes time Toe Spreading - 2 x daily - 2 sets - 20 reps - 2 seconds hold - time 2 minutes Seated Self Great Toe Stretch - 2 x daily - 10 reps - 5 seconds hold Sit to Stand without Arm  Support - 1 x daily - 2 sets - 15 reps Arch Lifting - 1-2 x daily - 20 reps        PT Education - 12/22/19 1315    Education Details Exercise purpose/form. Self management techniques. HEP    Person(s) Educated Patient    Methods Explanation;Demonstration;Tactile cues;Verbal cues    Comprehension Verbalized understanding;Returned demonstration;Verbal cues required;Tactile cues required;Need further instruction            PT Short Term Goals - 12/21/19 1809      PT SHORT TERM GOAL #1   Title Be independent with initial home exercise program for self-management of symptoms.    Baseline Initial HEP provided at IE (12/09/2019);    Time 2    Period Weeks    Status Achieved    Target Date 12/23/19             PT Long Term Goals - 12/22/19 1611      PT LONG TERM GOAL #1   Title Be independent  with a long-term home exercise program for self-management of symptoms.    Baseline Initial HEP provided at IE (12/09/2019); currently participating in recovery appropriate HEP (12/20/2019; 12/22/2019);    Time 12    Period Weeks    Status Partially Met   TARGET DATE FOR ALL LONG TERM GOALS: 03/02/2020     PT LONG TERM GOAL #2   Title Demonstrate improved FOTO score to 67 or greater to demonstrate improvement in overall condition and self-reported functional ability.    Baseline 54 (12/09/2019); too early to be appropriate for re-measure for this condition (12/20/2019);    Time 12    Period Weeks    Status On-going      PT LONG TERM GOAL #3   Title Patient will demonstrate L ankle PROM equal or greater than R to allow successfull return to ambulatory actiivties without boot or assistive device.    Baseline very limited compared to right (12/09/2019); improving per observation (12/20/2019; 12/22/2019);    Time 12    Period Weeks    Status Partially Met      PT LONG TERM GOAL #4   Title Patient will demonstrate equal or greater than 4+/5 MMT left ankle strength to allow patient to ambulate with normal gait and complete work and functional activities that require weight bearing.    Baseline unable to test due to hypersensitivity and fear (12/09/2019); improving tolerance to isometric exercise (12/20/2019; 12/22/2019);    Time 12    Period Weeks    Status New      PT LONG TERM GOAL #5   Title Complete community, work and/or recreational activities without limitation due to current condition.    Baseline difficulty with all weight bearing activities including household and community ambulation, going to [employer], stairs, visiting friends, social activities, playing for and caring for children, standing, working, bowling; currently not working (12/09/2019); continues to have similar limitations but is progressing appropriately at this point (12/20/2019; 12/22/2019);    Time 12    Period Weeks    Status  On-going                 Plan - 12/22/19 1610    Clinical Impression Statement Patient tolerated treatment well and was able to start progressing out of the boot, which she is very excited about. She had most of her pain with dorsiflexion stretch but was able to tolerate walking with shoes on and isometric heel raise well. Progressed  intrinsic foot strengthening to improve dynamic arch support. Patient has attended 4 physical therapy sessions and is making appropriate progress towards goals at this point. She has demonstrated good attendance except being slightly late for one appointment due to misunderstanding of time. She demonstrates improving activity tolerance, decreased anxiety about moving and touching foot/ankle resulting in improved ROM and has started to wean from the boot. Patient has not yet returned to prior level of function and requires continued PT as outlined in original plan of care. Patient presents with continued significant pain, hyperalgesia, allodynia, joint stiffness, muscle performance (strength/power/endurance), motor control, balance, gait, sensory, proprioceptive, and fascial impairments that are limiting ability to complete usual activities including all weight bearing activities such as household and community ambulation, going to [employer], stairs, visiting friends, social activities, playing for and caring for children, standing, working, and bowling without difficulty. Patient will benefit from skilled physical therapy intervention to address current body structure impairments and activity limitations to improve function and work towards goals set in current POC in order to return to prior level of function or maximal functional improvement.    Personal Factors and Comorbidities Age;Behavior Pattern;Comorbidity 3+    Comorbidities Relevant past medical history and comorbidities include severe uterine prolapse (sx planned soon, no lifting over 15 lbs), hernia repair in  the last year, current smoker, cholecystectomy, GERD, headache, neck pain, asthma .    Examination-Activity Limitations Bed Mobility;Bathing;Lift;Stand;Locomotion Level;Toileting;Bend;Transfers;Caring for Others;Carry;Sit;Dressing;Squat;Hygiene/Grooming;Stairs    Examination-Participation Restrictions Yard Work;Cleaning;Meal Prep;Community Activity;Driving;Interpersonal Relationship;Laundry;Volunteer;Other   work, Environmental consultant, social participation, playing with and caring for children   Stability/Clinical Decision Making Evolving/Moderate complexity    Rehab Potential Good    PT Frequency 2x / week    PT Duration 12 weeks    PT Treatment/Interventions ADLs/Self Care Home Management;Aquatic Therapy;Biofeedback;Cryotherapy;Electrical Stimulation;Moist Heat;DME Instruction;Gait training;Stair training;Functional mobility training;Therapeutic activities;Therapeutic exercise;Balance training;Neuromuscular re-education;Patient/family education;Orthotic Fit/Training;Manual techniques;Compression bandaging;Scar mobilization;Passive range of motion;Dry needling;Splinting;Visual/perceptual remediation/compensation;Spinal Manipulations;Taping;Joint Manipulations    PT Next Visit Plan intrinsic foot exercises, ROM, strengthening as tolerated and within known surgeon's precautions    PT Home Exercise Plan Medbridge Access Code: [email or web address]    Consulted and Agree with Plan of Care Patient           Patient will benefit from skilled therapeutic intervention in order to improve the following deficits and impairments:  Abnormal gait, Decreased skin integrity, Decreased knowledge of use of DME, Increased fascial restricitons, Pain, Improper body mechanics, Decreased scar mobility, Decreased mobility, Decreased coordination, Decreased activity tolerance, Decreased endurance, Decreased range of motion, Decreased strength, Hypomobility, Impaired perceived functional ability, Impaired flexibility, Increased edema, Difficulty  walking, Decreased balance  Visit Diagnosis: Pain in left ankle and joints of left foot  Stiffness of left ankle, not elsewhere classified  Muscle weakness (generalized)  Difficulty in walking, not elsewhere classified     Problem List Patient Active Problem List   Diagnosis Date Noted  . Incisional hernia, without obstruction or gangrene 02/21/2019  . Rectus diastasis 02/21/2019  . Ectopic pregnancy 06/20/2017  . Post-op pain 06/20/2017  . Cesarean delivery, delivered, current hospitalization 08/12/2016  . Term birth of newborn female 02/13/2011   Everlean Alstrom. Graylon Good, PT, DPT 12/22/19, 4:13 PM  [facility] [address] [place], [address] Phone: [phone]   Fax:  [phone]  Name: Christy Jones MRN: [id number] Date of Birth: Dec 08, 1985

## 2019-12-23 NOTE — Telephone Encounter (Signed)
I called and left a message for Christy Jones, informing her that Dr. Logan Bores said Christy Jones can transition out of the cam boot or discontinue the cam boot and be full weight bearing.

## 2019-12-29 ENCOUNTER — Encounter: Payer: Self-pay | Admitting: Physical Therapy

## 2019-12-29 ENCOUNTER — Ambulatory Visit: Payer: Medicaid Other | Admitting: Physical Therapy

## 2019-12-29 ENCOUNTER — Ambulatory Visit: Payer: Medicaid Other | Attending: Podiatry | Admitting: Physical Therapy

## 2019-12-29 ENCOUNTER — Other Ambulatory Visit: Payer: Self-pay

## 2019-12-29 DIAGNOSIS — M25572 Pain in left ankle and joints of left foot: Secondary | ICD-10-CM

## 2019-12-29 DIAGNOSIS — M6281 Muscle weakness (generalized): Secondary | ICD-10-CM | POA: Insufficient documentation

## 2019-12-29 DIAGNOSIS — M25672 Stiffness of left ankle, not elsewhere classified: Secondary | ICD-10-CM | POA: Diagnosis present

## 2019-12-29 DIAGNOSIS — R262 Difficulty in walking, not elsewhere classified: Secondary | ICD-10-CM | POA: Insufficient documentation

## 2019-12-29 NOTE — Therapy (Signed)
[facility] [address] [place], [address] Phone: [phone]   Fax:  [phone]  Physical Therapy Treatment / Progress Note Reporting Period 12/09/2019 - 12/29/2019  Patient Details  Name: Christy Jones MRN: [id number] Date of Birth: Jun 26, 1985 Referring Provider (PT): Edrick Kins, [place]   Encounter Date: 12/29/2019   PT End of Session - 12/29/19 1046    Visit Number 5    Number of Visits 18    Date for PT Re-Evaluation 03/02/20    Authorization Type Medicaid reporting period from 12/09/2019    Authorization Time Period --    Authorization - Visit Number --    Authorization - Number of Visits --    Progress Note Due on Visit 10    PT Start Time 1037    PT Stop Time 1120    PT Time Calculation (min) 43 min    Activity Tolerance Patient tolerated treatment well;Patient limited by pain    Behavior During Therapy [facility] for tasks assessed/performed           Past Medical History:  Diagnosis Date  . Asthma   . Chlamydia   . GERD (gastroesophageal reflux disease)   . Gestational diabetes    with pregnancy x2  . Headache(784.0)   . Pregnancy induced hypertension    post- partem 2012  . Sickle cell trait ([place])   . Trichomonas   . Umbilical hernia   . Urinary tract infection     Past Surgical History:  Procedure Laterality Date  . CESAREAN SECTION N/A 08/11/2016   Procedure: CESAREAN SECTION;  Surgeon: Vanessa Kick, MD;  Location: [facility];  Service: Obstetrics;  Laterality: N/A;  . CHOLECYSTECTOMY N/A 07/30/2012   Procedure: LAPAROSCOPIC CHOLECYSTECTOMY WITH INTRAOPERATIVE CHOLANGIOGRAM;  Surgeon: Adin Hector, MD;  Location: [facility];  Service: General;  Laterality: N/A;  . DIAGNOSTIC LAPAROSCOPY WITH REMOVAL OF ECTOPIC PREGNANCY N/A 06/20/2017   Procedure: DIAGNOSTIC LAPAROSCOPY WITH REMOVAL OF ECTOPIC PREGNANCY;  Surgeon: Lavonia Drafts, MD;  Location: [facility];  Service:  Gynecology;  Laterality: N/A;  . EXPLORATORY LAPAROTOMY     removal of ectopic pregnancy  . INCISIONAL HERNIA REPAIR N/A 02/25/2019   Procedure: OPEN INCISIONAL HERNIA REPAIR WITH MESH PATCH;  Surgeon: Armandina Gemma, MD;  Location: [facility];  Service: General;  Laterality: N/A;    There were no vitals filed for this visit.   Subjective Assessment - 12/29/19 1044    Subjective Patient reports she has been mostly keeing her boot off when at home and wearing it when she goes out except today coming to PT she left the boot in the car. Has had some swelling in the toes and took off the compression sleeve because she thought this was making it worse. Did have a lot of pain one day after trying to go without the boot outside on an uneven surface. Can get her foot into one of three pairs of shoes she has. HEP is going well. Will see surgeon on Friday.    Pertinent History Patient is a 34 y.o. female who presents to outpatient physical therapy with a referral for medical diagnosis left plantar fasciitis, s/p left foot surgery (endoscopic plantar fasciotomy and tarsal exostectomy 10/07/19). This patient's chief complaints consist of left foot and ankle pain, stiffness, poor weight bearing tolerance leading to the following functional deficits: difficulty with all weight bearing activities including household and community ambulation, going to [employer], stairs, visiting  friends, social activities, playing for and caring for children, standing, working, bowling.. Relevant past medical history and comorbidities include severe uterine prolapse (sx planned soon, no lifting over 15 lbs), hernia repair in the last year, current smoker, cholecystectomy, GERD, headache, neck pain, asthma .  Patient denies hx of cancer, stroke, seizures, lung problem, major cardiac events, diabetes, unexplained weight loss, changes in bowel or bladder problems.    Limitations Standing;Walking;House hold activities;Other  (comment)   work, anything that requires weight bearing, bowling   Diagnostic tests Radiograph 10/15/19: "Osteotomies sites appear to be stable with routine healing"    Patient Stated Goals to get better    Currently in Pain? Yes    Pain Score 6     Pain Location Ankle    Pain Orientation Left;Medial    Pain Onset More than a month ago            OBJECTIVE  OBSERVATION/INSPECTION  Tremor: none  Muscle bulk: significant atrophy noted in left calf and less so in thigh.   Edema: mild edema present in left ankle region with more prominent edema at plantar surface of foot just distal to calcaneous.   Skin: The incision sites appear to be healing well with no excessive redness, warmth, drainage or signs of infection present.    Transfers: sit <> stand mod I due to increased time while wearing shoes or just socks, slow and careful, shifts weight away from left   Gait: ambulated in to clinic wearing shoes and full wei ght bearing. Antalgic gait favoring L LE. Poor L dorsiflexion and roll over at toe off.   Stairs: deferred  CIRCUMFERENCE MEASUREMENTS Calf circumference (largest spot):  R = 39 cm L = 34.5 cm  NEUROLOGICAL Able to feel light touch over dorsal medial L foot.   PERIPHERAL JOINT MOTION (in degrees)  Active Range of Motion (AROM) *Indicates pain 12/09/19 12/29/2019 Date  Joint/Motion R/L R/L R/L  Ankle/Foot     Dorsiflexion (knee flex) 10/-8* /0 /  Plantarflexion 60/30* /35 /  [place]ison 12/0* /8 /  Inversion 30/0* /10 /  Great toe extension 70/52* / /  Comments: Hip and knee ROM appear to be WFL for basic tasks bilaterally.   Passive Range of Motion (PROM) *Indicates pain 12/09/19 12/29/2019 Date  Joint/Motion R/L R/L R/L  Ankle/Foot     Dorsiflexion (knee flex) 10/ /8 /  Great toe extension 90/ /90 /  Comments:  12/09/2019: Some testing deferred due to hypersensitivity and apprehension with touch at the left ankle/foot. B hip and knees and R  ankle/foot appear WFL for basic tasks   MUSCLE PERFORMANCE (MMT):  *Indicates pain 12/09/19 12/29/2019 Date  Joint/Motion R/L R/L R/L  Hip     Flexion 5/5 / /  Extension (knee ext) 5/5 / /  Abduction 5/4+ / /  Adduction 4+/4 / /  Knee     Extension 5/4+ / /  Flexion 5/4** / /  Ankle/Foot (seated)     Dorsiflexion  / 5/4+ /  Great toe extension / 5/4+* /  Eversion / 5/4 /  Plantarflexion / 5/4+ /  Inversion / 4/3* /  Pronation / 4+/4 /  Great toe flexion / / /  Comments:  12/09/2019: R ankle WFL, L ankle deferred due to hypersensitivity and apprehension. Pain over contact point at left calf with knee flexion testing.    EDUCATION/COGNITION: Patient is alert and oriented X 4.  Objective measurements completed on examination: See above findings.          OBJECTIVE:  s/p left foot surgery 10/07/2019 (endoscopic plantar fasciotomy and tarsal exostectomy - along medial malleolous)  12/21/2019 (from Dr. Evans): "Patient can be full weight bearing. Transition out of CAM boot beginning today, 12/21/2019" TREATMENT:  Therapeutic exercise:to centralize symptoms and improve ROM, strength, muscular endurance, and activity tolerance required for successful completion of functional activities.  Shoe/Boot Doffed:   - Seated left ankle BAPS board, level 3, plantarflexion/dorsiflexion, inversion/eversion, circles clockwise, circles counter clockwise, 20 reps min each directions. Pt required cuing to keep knee still and instruction on how to perform exercise.  - measurements to assess progress (see above).  - seated PROM L great toe flexion and ankle PF stretch, patient providing force with own hands, 5 second holds x 15.  - attempted standing and seated L ankle PF and toe PF stretch with top of foot/toe against floor but too uncomfortable due to toenail excision site.  - L gastroc stretch/ankle DF mobilization with BUE on treadmill bar and knee extended. 5 second hold, x15.    - L ankle mobility/soleus stretch in step standing with L ankle on step and controlling movement with leg and B UE support at the stairs. 5 second hold x 15.  - Education on HEP including handout   Patient donned boot.    HOME EXERCISE PROGRAM Access Code: [email or web address] URL: [email or web address] Date: 12/29/2019 Prepared by: Sara Snyder  Exercises Isometric Heel Raise at Wall - 1 x daily - 5 reps - 45 seconds hold Standing Gastroc Stretch - 2 x daily - 1-2 sets - 10 reps - 5 seconds hold Standing Ankle Dorsiflexion Stretch on Chair - 2 x daily - 1-2 sets - 10 reps - 5 seconds hold Seated Heel Toe Raises - 2 x daily - 2 sets - 20 reps Seated Toe Flexion Extension PROM - 2 x daily - 10 reps - 5 seconds hold Seated Great Toe Extension - 2 x daily - 2 sets - 20 reps - 2 seconds hold Seated Lesser Toes Extension - 2 x daily - 2 sets - 20 reps - 2 seconds hold Toe Spreading - 2 x daily - 2 sets - 20 reps - 2 seconds hold Arch Lifting - 1-2 x daily - 20 reps       PT Education - 12/29/19 1139    Education Details Exercise purpose/form. Self management techniques. HEP    Person(s) Educated Patient    Methods Explanation;Demonstration;Tactile cues;Verbal cues;Handout    Comprehension Verbalized understanding;Returned demonstration;Verbal cues required;Tactile cues required;Need further instruction            PT Short Term Goals - 12/21/19 1809      PT SHORT TERM GOAL #1   Title Be independent with initial home exercise program for self-management of symptoms.    Baseline Initial HEP provided at IE (12/09/2019);    Time 2    Period Weeks    Status Achieved    Target Date 12/23/19             PT Long Term Goals - 12/29/19 1153      PT LONG TERM GOAL #1   Title Be independent with a long-term home exercise program for self-management of symptoms.    Baseline Initial HEP provided at IE (12/09/2019); currently participating in recovery appropriate HEP (12/20/2019;  12/22/2019; 12/29/2019);    Time 12    Period Weeks    Status Partially Met   TARGET DATE FOR ALL LONG TERM GOALS: 03/02/2020     PT LONG TERM   GOAL #2   Title Demonstrate improved FOTO score to 67 or greater to demonstrate improvement in overall condition and self-reported functional ability.    Baseline 54 (12/09/2019); too early to be appropriate for re-measure for this condition (12/20/2019);    Time 12    Period Weeks    Status On-going      PT LONG TERM GOAL #3   Title Patient will demonstrate L ankle PROM equal or greater than R to allow successfull return to ambulatory actiivties without boot or assistive device.    Baseline very limited compared to right (12/09/2019); improving per observation (12/20/2019; 12/22/2019); improving - see objective exam (12/29/2019);    Time 12    Period Weeks    Status Partially Met      PT LONG TERM GOAL #4   Title Patient will demonstrate equal or greater than 4+/5 MMT left ankle strength to allow patient to ambulate with normal gait and complete work and functional activities that require weight bearing.    Baseline unable to test due to hypersensitivity and fear (12/09/2019); improving tolerance to isometric exercise (12/20/2019; 12/22/2019); improving - see objective exam (12/29/2019);    Time 12    Period Weeks    Status Partially Met      PT LONG TERM GOAL #5   Title Complete community, work and/or recreational activities without limitation due to current condition.    Baseline difficulty with all weight bearing activities including household and community ambulation, going to the beach, stairs, visiting friends, social activities, playing for and caring for children, standing, working, bowling; currently not working (12/09/2019); continues to have similar limitations but is progressing appropriately at this point (12/20/2019; 12/22/2019); weaining out of boot - continues to have similar restrictions at this point (12/29/2019);    Time 12    Period Weeks     Status On-going                 Plan - 12/29/19 1152    Clinical Impression Statement Patient tolerated treatment well overall with some difficulty due to discomfort, especially with weight bearing without shoe. Able to tolerate ankle DF stretches which were added to HEP to improve motion for normal gait. Patient has attended 5 physical therapy treatment sessions this episode of care and is making appropriate progress towards goals at this point. Demonstrates decreased hypersensitivity and kinesiophobia,  Improved ROM, strength, and weight bearing tolerance. Is working on weaning from the boot. Continues to report significant pain, stiffness, weakness, and impaired weight bearing tolerance with altered gait. Patient would benefit from continued management of limiting condition by skilled physical therapist to address remaining impairments and functional limitations to work towards stated goals and return to PLOF or maximal functional independence.    Personal Factors and Comorbidities Age;Behavior Pattern;Comorbidity 3+    Comorbidities Relevant past medical history and comorbidities include severe uterine prolapse (sx planned soon, no lifting over 15 lbs), hernia repair in the last year, current smoker, cholecystectomy, GERD, headache, neck pain, asthma .    Examination-Activity Limitations Bed Mobility;Bathing;Lift;Stand;Locomotion Level;Toileting;Bend;Transfers;Caring for Others;Carry;Sit;Dressing;Squat;Hygiene/Grooming;Stairs    Examination-Participation Restrictions Yard Work;Cleaning;Meal Prep;Community Activity;Driving;Interpersonal Relationship;Laundry;Volunteer;Other   work, bowling, social participation, playing with and caring for children   Stability/Clinical Decision Making Evolving/Moderate complexity    Rehab Potential Good    PT Frequency 2x / week    PT Duration 12 weeks    PT Treatment/Interventions ADLs/Self Care Home Management;Aquatic  Therapy;Biofeedback;Cryotherapy;Electrical Stimulation;Moist Heat;DME Instruction;Gait training;Stair training;Functional mobility training;Therapeutic activities;Therapeutic exercise;Balance training;Neuromuscular re-education;Patient/family education;Orthotic Fit/Training;Manual   techniques;Compression bandaging;Scar mobilization;Passive range of motion;Dry needling;Splinting;Visual/perceptual remediation/compensation;Spinal Manipulations;Taping;Joint Manipulations    PT Next Visit Plan intrinsic foot exercises, ROM, strengthening as tolerated and within known surgeon's precautions    PT Home Exercise Plan Medbridge Access Code: [email or web address]    Consulted and Agree with Plan of Care Patient           Patient will benefit from skilled therapeutic intervention in order to improve the following deficits and impairments:  Abnormal gait, Decreased skin integrity, Decreased knowledge of use of DME, Increased fascial restricitons, Pain, Improper body mechanics, Decreased scar mobility, Decreased mobility, Decreased coordination, Decreased activity tolerance, Decreased endurance, Decreased range of motion, Decreased strength, Hypomobility, Impaired perceived functional ability, Impaired flexibility, Increased edema, Difficulty walking, Decreased balance  Visit Diagnosis: Pain in left ankle and joints of left foot  Stiffness of left ankle, not elsewhere classified  Muscle weakness (generalized)  Difficulty in walking, not elsewhere classified     Problem List Patient Active Problem List   Diagnosis Date Noted  . Incisional hernia, without obstruction or gangrene 02/21/2019  . Rectus diastasis 02/21/2019  . Ectopic pregnancy 06/20/2017  . Post-op pain 06/20/2017  . Cesarean delivery, delivered, current hospitalization 08/12/2016  . Term birth of newborn female 02/13/2011    Sara R. Snyder, PT, DPT 12/29/19, 11:55 AM  [facility]  [facility]ICINE [address] [place], [place], [address] Phone: [phone]   Fax:  [phone]  Name: Katharina A Moder MRN: [id number] Date of Birth: 06/14/1986   

## 2019-12-31 ENCOUNTER — Other Ambulatory Visit: Payer: Self-pay

## 2019-12-31 ENCOUNTER — Ambulatory Visit (INDEPENDENT_AMBULATORY_CARE_PROVIDER_SITE_OTHER): Payer: Medicaid Other | Admitting: Podiatry

## 2019-12-31 DIAGNOSIS — M722 Plantar fascial fibromatosis: Secondary | ICD-10-CM

## 2019-12-31 DIAGNOSIS — M898X7 Other specified disorders of bone, ankle and foot: Secondary | ICD-10-CM

## 2019-12-31 DIAGNOSIS — L600 Ingrowing nail: Secondary | ICD-10-CM

## 2019-12-31 DIAGNOSIS — Z9889 Other specified postprocedural states: Secondary | ICD-10-CM

## 2019-12-31 NOTE — Progress Notes (Signed)
   Subjective:  Patient presents today status post permanent partial nail avulsion of the lateral borders of the bilateral great toes; EPF left; tarsal exostectomy left. DOS: 10/07/2019.  Patient states that the physical therapy is helping significantly.  She is currently ambulating in the cam boot.  She continues to notice some tenderness and swelling to the foot.  She also ices and elevates the foot as needed.  Otherwise no new complaints at this time.  Past Medical History:  Diagnosis Date  . Asthma   . Chlamydia   . GERD (gastroesophageal reflux disease)   . Gestational diabetes    with pregnancy x2  . Headache(784.0)   . Pregnancy induced hypertension    post- partem 2012  . Sickle cell trait ([facility])   . Trichomonas   . Umbilical hernia   . Urinary tract infection       Objective/Physical Exam Neurovascular status intact.  Skin incisions appear to be well coapted and healed.  No sign of infectious process noted.  There continues to be some moderate edema noted to the surgical extremity. Range of motion is within normal limits bilateral feet.  Muscle strength 5/5 all compartments.  Negative for any significant pain to palpation throughout the left foot and ankle.  Assessment: 1. s/p permanent partial nail avulsion of the lateral borders of the bilateral great toes; EPF left; tarsal exostectomy left. DOS: 10/07/2019.    Plan of Care:  1. Patient was evaluated.  2.  Continue physical therapy with Roseanne Reno PT 3.  Discontinue cam boot.  Transition into good supportive sneakers 4.  Compression anklet dispensed.  Wear daily 5.  Return to clinic in 4 weeks  *Works at [employer].  Explained that we will continue to keep the patient out of work and reevaluate monthly  Felecia Shelling, DPM [facility]  Dr. Felecia Shelling, DPM    [address]                                        [place], [place] [address]                Office [phone]  Fax [phone]

## 2020-01-03 ENCOUNTER — Ambulatory Visit: Payer: Medicaid Other | Admitting: Physical Therapy

## 2020-01-03 ENCOUNTER — Encounter: Payer: Self-pay | Admitting: Physical Therapy

## 2020-01-03 ENCOUNTER — Other Ambulatory Visit: Payer: Self-pay

## 2020-01-03 DIAGNOSIS — M25572 Pain in left ankle and joints of left foot: Secondary | ICD-10-CM

## 2020-01-03 DIAGNOSIS — M6281 Muscle weakness (generalized): Secondary | ICD-10-CM

## 2020-01-03 DIAGNOSIS — R262 Difficulty in walking, not elsewhere classified: Secondary | ICD-10-CM

## 2020-01-03 DIAGNOSIS — M25672 Stiffness of left ankle, not elsewhere classified: Secondary | ICD-10-CM

## 2020-01-03 NOTE — Therapy (Signed)
[facility] [address] [place], [address] Phone: [phone]   Fax:  [phone]  Physical Therapy Treatment  Patient Details  Name: Christy Jones MRN: [id number] Date of Birth: 07-23-1985 Referring Provider (PT): Edrick Kins, [place]   Encounter Date: 01/03/2020   PT End of Session - 01/03/20 1359    Visit Number 6    Number of Visits 18    Date for PT Re-Evaluation 03/02/20    Authorization Type Medicaid reporting period from 12/09/2019    Progress Note Due on Visit 10    PT Start Time 1350    PT Stop Time 1430    PT Time Calculation (min) 40 min    Activity Tolerance Patient tolerated treatment well;Patient limited by pain    Behavior During Therapy [facility] for tasks assessed/performed           Past Medical History:  Diagnosis Date  . Asthma   . Chlamydia   . GERD (gastroesophageal reflux disease)   . Gestational diabetes    with pregnancy x2  . Headache(784.0)   . Pregnancy induced hypertension    post- partem 2012  . Sickle cell trait ([place])   . Trichomonas   . Umbilical hernia   . Urinary tract infection     Past Surgical History:  Procedure Laterality Date  . CESAREAN SECTION N/A 08/11/2016   Procedure: CESAREAN SECTION;  Surgeon: Vanessa Kick, MD;  Location: [facility];  Service: Obstetrics;  Laterality: N/A;  . CHOLECYSTECTOMY N/A 07/30/2012   Procedure: LAPAROSCOPIC CHOLECYSTECTOMY WITH INTRAOPERATIVE CHOLANGIOGRAM;  Surgeon: Adin Hector, MD;  Location: [facility];  Service: General;  Laterality: N/A;  . DIAGNOSTIC LAPAROSCOPY WITH REMOVAL OF ECTOPIC PREGNANCY N/A 06/20/2017   Procedure: DIAGNOSTIC LAPAROSCOPY WITH REMOVAL OF ECTOPIC PREGNANCY;  Surgeon: Lavonia Drafts, MD;  Location: [facility];  Service: Gynecology;  Laterality: N/A;  . EXPLORATORY LAPAROTOMY     removal of ectopic pregnancy  . INCISIONAL HERNIA REPAIR N/A 02/25/2019   Procedure: OPEN INCISIONAL HERNIA  REPAIR WITH MESH PATCH;  Surgeon: Armandina Gemma, MD;  Location: [facility];  Service: General;  Laterality: N/A;    There were no vitals filed for this visit.   Subjective Assessment - 01/03/20 1352    Subjective Patient reports there has been added stress in her family recently with a sick family member. She may have trouble attending PT if the person who watches her son has to cancel last minute related to this family member being sick. She saw her surgeon who wanted her to walk in without the boot at her next follow up. He wanted her to continue PT even if she feels like she doesn't need it due to long term benefits.  State she was pretty sore folloiwng last treatment session.    Pertinent History Patient is a 34 y.o. female who presents to outpatient physical therapy with a referral for medical diagnosis left plantar fasciitis, s/p left foot surgery (endoscopic plantar fasciotomy and tarsal exostectomy 10/07/19). This patient's chief complaints consist of left foot and ankle pain, stiffness, poor weight bearing tolerance leading to the following functional deficits: difficulty with all weight bearing activities including household and community ambulation, going to [employer], stairs, visiting friends, social activities, playing for and caring for children, standing, working, Environmental consultant.. Relevant past medical history and comorbidities include severe uterine prolapse (sx planned soon, no lifting over 15 lbs), hernia repair in the last year, current smoker,  cholecystectomy, GERD, headache, neck pain, asthma .  Patient denies hx of cancer, stroke, seizures, lung problem, major cardiac events, diabetes, unexplained weight loss, changes in bowel or bladder problems.    Limitations Standing;Walking;House hold activities;Other (comment)   work, anything that requires weight bearing, bowling   Diagnostic tests Radiograph 10/15/19: "Osteotomies sites appear to be stable with routine healing"     Patient Stated Goals to get better    Currently in Pain? No/denies    Pain Onset More than a month ago           OBJECTIVE:  s/p left foot surgery4/15/2021(endoscopic plantar fasciotomy and tarsal exostectomy - along medial malleolous)  7/12//2021: Patient reports Dr. [name] wants her to walk into the clinic out of a boot when he sees her again.   TREATMENT:  Therapeutic exercise:to centralize symptoms and improve ROM, strength, muscular endurance, and activity tolerance required for successful completion of functional activities.  Shoe/Boot Doffed:   - Seated left ankle BAPS board, level 3, plantarflexion/dorsiflexion, inversion/eversion, circles clockwise, circles counter clockwise, 20 reps min each directions. Pt required cuing to keep knee still and instruction on how to perform exercise. Attempted level 4 and unable to make contact with floor.  - seated ankle 4-way band exercises with yellow theraband tied in a loop around forefoot: 1x20 each, performed very slowly by patient. Plantar flexion, dorsiflexion, eversion, inversion(in figure 4 position).  - attempted standing B heel raise x 3, pt reports pain and that she has not been doing well at home with isometric holds, discontinued.  - attempted single leg stance, but quite painful and difficult on L LE so moved to tandem stance 3x 30 seconds each side.  - Education on HEP with update in specific instructions and weekly frequency including handout   Patient donned boot.   Pt required multimodal cuing for proper technique and to facilitate improved neuromuscular control, strength, range of motion, and functional ability resulting in improved performance and form.     HOME EXERCISE PROGRAM Access Code: [email or web address] URL: [email or web address] Date: 01/03/2020 Prepared by: [name]  Exercises Seated Toe Flexion Extension PROM - 1 x daily - 10 reps - 5 seconds hold Standing Gastroc Stretch - 1 x daily -  1 sets - 10 reps - 5 seconds hold Standing Ankle Dorsiflexion Stretch on Chair - 1 x daily - 1 sets - 10 reps - 5 seconds hold Tandem Stance - 4 x weekly - 3 reps - 30 seconds hold Seated Figure 4 Ankle Inversion with Resistance - 4 x weekly - 3 sets - 20 reps - 1 second hold Seated Ankle Eversion with Resistance - 4 x weekly - 3 sets - 20 reps - 1 second hold Seated Ankle Plantarflexion with Resistance - 4 x weekly - 3 sets - 20 reps - 1 second hold Seated Ankle Dorsiflexion with Resistance - 4 x weekly - 3 sets - 20 reps - 1 second hold Seated Great Toe Extension - 3 x weekly - 2 sets - 20 reps - 2 seconds hold Seated Lesser Toes Extension - 3 x weekly - 2 sets - 20 reps - 2 seconds hold Toe Spreading - 3 x weekly - 2 sets - 20 reps - 2 seconds hold Arch Lifting - 3 x weekly - 20 reps - 2 sets - 2 seconds hold       PT Education - 01/03/20 1359    Education Details Exercise purpose/form. Self management techniques    Person(s) Educated Patient  Methods Explanation;Demonstration;Tactile cues;Verbal cues    Comprehension Verbalized understanding;Returned demonstration;Verbal cues required;Tactile cues required;Need further instruction            PT Short Term Goals - 12/21/19 1809      PT SHORT TERM GOAL #1   Title Be independent with initial home exercise program for self-management of symptoms.    Baseline Initial HEP provided at IE (12/09/2019);    Time 2    Period Weeks    Status Achieved    Target Date 12/23/19             PT Long Term Goals - 12/29/19 1153      PT LONG TERM GOAL #1   Title Be independent with a long-term home exercise program for self-management of symptoms.    Baseline Initial HEP provided at IE (12/09/2019); currently participating in recovery appropriate HEP (12/20/2019; 12/22/2019; 12/29/2019);    Time 12    Period Weeks    Status Partially Met   TARGET DATE FOR ALL LONG TERM GOALS: 03/02/2020     PT LONG TERM GOAL #2   Title Demonstrate improved  FOTO score to 67 or greater to demonstrate improvement in overall condition and self-reported functional ability.    Baseline 54 (12/09/2019); too early to be appropriate for re-measure for this condition (12/20/2019);    Time 12    Period Weeks    Status On-going      PT LONG TERM GOAL #3   Title Patient will demonstrate L ankle PROM equal or greater than R to allow successfull return to ambulatory actiivties without boot or assistive device.    Baseline very limited compared to right (12/09/2019); improving per observation (12/20/2019; 12/22/2019); improving - see objective exam (12/29/2019);    Time 12    Period Weeks    Status Partially Met      PT LONG TERM GOAL #4   Title Patient will demonstrate equal or greater than 4+/5 MMT left ankle strength to allow patient to ambulate with normal gait and complete work and functional activities that require weight bearing.    Baseline unable to test due to hypersensitivity and fear (12/09/2019); improving tolerance to isometric exercise (12/20/2019; 12/22/2019); improving - see objective exam (12/29/2019);    Time 12    Period Weeks    Status Partially Met      PT LONG TERM GOAL #5   Title Complete community, work and/or recreational activities without limitation due to current condition.    Baseline difficulty with all weight bearing activities including household and community ambulation, going to [employer], stairs, visiting friends, social activities, playing for and caring for children, standing, working, bowling; currently not working (12/09/2019); continues to have similar limitations but is progressing appropriately at this point (12/20/2019; 12/22/2019); weaining out of boot - continues to have similar restrictions at this point (12/29/2019);    Time 12    Period Weeks    Status On-going                 Plan - 01/03/20 1642    Clinical Impression Statement Patient tolerated treatment well overall with some difficulty due to discomfort in the  plantar aspect of the L foot and ankle. Introduced band strengthening this session with good tolerance, so updated HEP. Patient continues to be apprehensive and needs increased time secondary to slow velocity of movement while completing exercises and during transitions. Patient would benefit from continued management of limiting condition by skilled physical therapist to address  remaining impairments and functional limitations to work towards stated goals and return to PLOF or maximal functional independence.    Personal Factors and Comorbidities Age;Behavior Pattern;Comorbidity 3+    Comorbidities Relevant past medical history and comorbidities include severe uterine prolapse (sx planned soon, no lifting over 15 lbs), hernia repair in the last year, current smoker, cholecystectomy, GERD, headache, neck pain, asthma .    Examination-Activity Limitations Bed Mobility;Bathing;Lift;Stand;Locomotion Level;Toileting;Bend;Transfers;Caring for Others;Carry;Sit;Dressing;Squat;Hygiene/Grooming;Stairs    Examination-Participation Restrictions Yard Work;Cleaning;Meal Prep;Community Activity;Driving;Interpersonal Relationship;Laundry;Volunteer;Other   work, Environmental consultant, social participation, playing with and caring for children   Stability/Clinical Decision Making Evolving/Moderate complexity    Rehab Potential Good    PT Frequency 2x / week    PT Duration 12 weeks    PT Treatment/Interventions ADLs/Self Care Home Management;Aquatic Therapy;Biofeedback;Cryotherapy;Electrical Stimulation;Moist Heat;DME Instruction;Gait training;Stair training;Functional mobility training;Therapeutic activities;Therapeutic exercise;Balance training;Neuromuscular re-education;Patient/family education;Orthotic Fit/Training;Manual techniques;Compression bandaging;Scar mobilization;Passive range of motion;Dry needling;Splinting;Visual/perceptual remediation/compensation;Spinal Manipulations;Taping;Joint Manipulations    PT Next Visit Plan  intrinsic foot exercises, ROM, strengthening as tolerated and within known surgeon's precautions    PT Home Exercise Plan Medbridge Access Code: [email or web address]    Consulted and Agree with Plan of Care Patient           Patient will benefit from skilled therapeutic intervention in order to improve the following deficits and impairments:  Abnormal gait, Decreased skin integrity, Decreased knowledge of use of DME, Increased fascial restricitons, Pain, Improper body mechanics, Decreased scar mobility, Decreased mobility, Decreased coordination, Decreased activity tolerance, Decreased endurance, Decreased range of motion, Decreased strength, Hypomobility, Impaired perceived functional ability, Impaired flexibility, Increased edema, Difficulty walking, Decreased balance  Visit Diagnosis: Pain in left ankle and joints of left foot  Stiffness of left ankle, not elsewhere classified  Muscle weakness (generalized)  Difficulty in walking, not elsewhere classified     Problem List Patient Active Problem List   Diagnosis Date Noted  . Incisional hernia, without obstruction or gangrene 02/21/2019  . Rectus diastasis 02/21/2019  . Ectopic pregnancy 06/20/2017  . Post-op pain 06/20/2017  . Cesarean delivery, delivered, current hospitalization 08/12/2016  . Term birth of newborn female 02/13/2011    Everlean Alstrom. Graylon Good, PT, DPT 01/03/20, 4:42 PM  [facility] [address] [place], [address] Phone: [phone]   Fax:  [phone]  Name: Christy Jones MRN: [id number] Date of Birth: 1985/10/28

## 2020-01-05 ENCOUNTER — Other Ambulatory Visit: Payer: Self-pay

## 2020-01-05 ENCOUNTER — Ambulatory Visit: Payer: Medicaid Other | Admitting: Physical Therapy

## 2020-01-05 ENCOUNTER — Encounter: Payer: Self-pay | Admitting: Physical Therapy

## 2020-01-05 DIAGNOSIS — M25572 Pain in left ankle and joints of left foot: Secondary | ICD-10-CM | POA: Diagnosis not present

## 2020-01-05 DIAGNOSIS — M6281 Muscle weakness (generalized): Secondary | ICD-10-CM

## 2020-01-05 DIAGNOSIS — M25672 Stiffness of left ankle, not elsewhere classified: Secondary | ICD-10-CM

## 2020-01-05 DIAGNOSIS — R262 Difficulty in walking, not elsewhere classified: Secondary | ICD-10-CM

## 2020-01-05 NOTE — Therapy (Signed)
[facility] [address], [place], [address] Phone: [phone]   Fax:  [phone]  Physical Therapy Treatment  Patient Details  Name: Christy Jones MRN: [id number] Date of Birth: [birth year] Referring Provider (PT): Edrick Kins, [place]   Encounter Date: 01/05/2020   PT End of Session - 01/05/20 1607    Visit Number 7    Number of Visits 18    Date for PT Re-Evaluation 03/02/20    Authorization Type Medicaid reporting period from 12/09/2019    Progress Note Due on Visit 10    PT Start Time 1350    PT Stop Time 1428    PT Time Calculation (min) 38 min    Activity Tolerance Patient tolerated treatment well;Patient limited by pain    Behavior During Therapy [facility] for tasks assessed/performed           Past Medical History:  Diagnosis Date  . Asthma   . Chlamydia   . GERD (gastroesophageal reflux disease)   . Gestational diabetes    with pregnancy x2  . Headache(784.0)   . Pregnancy induced hypertension    post- partem 2012  . Sickle cell trait ([place])   . Trichomonas   . Umbilical hernia   . Urinary tract infection     Past Surgical History:  Procedure Laterality Date  . CESAREAN SECTION N/A 08/11/2016   Procedure: CESAREAN SECTION;  Surgeon: Vanessa Kick, MD;  Location: [facility];  Service: Obstetrics;  Laterality: N/A;  . CHOLECYSTECTOMY N/A 07/30/2012   Procedure: LAPAROSCOPIC CHOLECYSTECTOMY WITH INTRAOPERATIVE CHOLANGIOGRAM;  Surgeon: Adin Hector, MD;  Location: [facility];  Service: General;  Laterality: N/A;  . DIAGNOSTIC LAPAROSCOPY WITH REMOVAL OF ECTOPIC PREGNANCY N/A 06/20/2017   Procedure: DIAGNOSTIC LAPAROSCOPY WITH REMOVAL OF ECTOPIC PREGNANCY;  Surgeon: Lavonia Drafts, MD;  Location: [facility];  Service: Gynecology;  Laterality: N/A;  . EXPLORATORY LAPAROTOMY     removal of ectopic pregnancy  . INCISIONAL HERNIA REPAIR N/A 02/25/2019   Procedure: OPEN INCISIONAL HERNIA  REPAIR WITH MESH PATCH;  Surgeon: Armandina Gemma, MD;  Location: [facility];  Service: General;  Laterality: N/A;    There were no vitals filed for this visit.   Subjective Assessment - 01/05/20 1354    Subjective Patient reports her left ankle and foot is really hurting and has been hurting since her last PT session. She rates her pain as 6/10 and the swelling prevented her from donning her shoe (so she came in with slippers).    Pertinent History Patient is a 34 y.o. female who presents to outpatient physical therapy with a referral for medical diagnosis left plantar fasciitis, s/p left foot surgery (endoscopic plantar fasciotomy and tarsal exostectomy 10/07/19). This patient's chief complaints consist of left foot and ankle pain, stiffness, poor weight bearing tolerance leading to the following functional deficits: difficulty with all weight bearing activities including household and community ambulation, going to [employer], stairs, visiting friends, social activities, playing for and caring for children, standing, working, Environmental consultant.. Relevant past medical history and comorbidities include severe uterine prolapse (sx planned soon, no lifting over 15 lbs), hernia repair in the last year, current smoker, cholecystectomy, GERD, headache, neck pain, asthma .  Patient denies hx of cancer, stroke, seizures, lung problem, major cardiac events, diabetes, unexplained weight loss, changes in bowel or bladder problems.    Limitations Standing;Walking;House hold activities;Other (comment)   work, anything that requires weight bearing, bowling  Diagnostic tests Radiograph 10/15/19: "Osteotomies sites appear to be stable with routine healing"    Patient Stated Goals to get better    Currently in Pain? Yes    Pain Score 6     Pain Location Ankle    Pain Orientation Left    Pain Onset More than a month ago            OBJECTIVE:  s/p left foot surgery4/15/2021(endoscopic plantar fasciotomy  and tarsal exostectomy - along medial malleolous)  7/12//2021: Patient reports Dr. [name] wants her to walk into the clinic out of a boot when he sees her again.   TREATMENT:  Manual therapy: to reduce pain and tissue tension, improve range of motion, neuromodulation, in order to promote improved ability to complete functional activities.  Shoe/Boot Doffed:  PRONE  - light to deep STM over left triceps surae gradually moving down toward the Achilles region and the plantar surface of the foot. Then moved with light distal to proximal stroking to the entire foot, ankle, and lower leg to encourage clearance of edema.  - gentle scar massage over incision at medial left ankle as tolerated.  - Joint mobilizations grade I-IV at L talocrual anterior and posterior glides, subtalar lateral and medial glides.  - PROM with overpressure to tolerance in ankle plantarflexion, dorsiflexion, inversion, and eversion.   SUPINE - joint mobilization to L ankle posterior talocrual glide grade II-IV with ankle dorsiflexion PROM with overpressure as tolerated.  - L ankle dorsiflexion PROM stretch with overpressure as tolerated without joint mob.   Patient donned boot.    HOME EXERCISE PROGRAM Access Code: [email or web address] URL: [email or web address] Date: 01/03/2020 Prepared by: [name]  Exercises Seated Toe Flexion Extension PROM - 1 x daily - 10 reps - 5 seconds hold Standing Gastroc Stretch - 1 x daily - 1 sets - 10 reps - 5 seconds hold Standing Ankle Dorsiflexion Stretch on Chair - 1 x daily - 1 sets - 10 reps - 5 seconds hold Tandem Stance - 4 x weekly - 3 reps - 30 seconds hold Seated Figure 4 Ankle Inversion with Resistance - 4 x weekly - 3 sets - 20 reps - 1 second hold Seated Ankle Eversion with Resistance - 4 x weekly - 3 sets - 20 reps - 1 second hold Seated Ankle Plantarflexion with Resistance - 4 x weekly - 3 sets - 20 reps - 1 second hold Seated Ankle Dorsiflexion with  Resistance - 4 x weekly - 3 sets - 20 reps - 1 second hold Seated Great Toe Extension - 3 x weekly - 2 sets - 20 reps - 2 seconds hold Seated Lesser Toes Extension - 3 x weekly - 2 sets - 20 reps - 2 seconds hold Toe Spreading - 3 x weekly - 2 sets - 20 reps - 2 seconds hold Arch Lifting - 3 x weekly - 20 reps - 2 sets - 2 seconds hold    PT Education - 01/05/20 1607    Education Details intervention purpose/form. Self management techniques    Person(s) Educated Patient    Methods Explanation;Demonstration;Tactile cues;Verbal cues    Comprehension Verbalized understanding;Returned demonstration;Verbal cues required;Tactile cues required;Need further instruction            PT Short Term Goals - 12/21/19 1809      PT SHORT TERM GOAL #1   Title Be independent with initial home exercise program for self-management of symptoms.    Baseline Initial HEP provided at [facility] (12/09/2019);  Time 2    Period Weeks    Status Achieved    Target Date 12/23/19             PT Long Term Goals - 12/29/19 1153      PT LONG TERM GOAL #1   Title Be independent with a long-term home exercise program for self-management of symptoms.    Baseline Initial HEP provided at [facility] (12/09/2019); currently participating in recovery appropriate HEP (12/20/2019; 12/22/2019; 12/29/2019);    Time 12    Period Weeks    Status Partially Met   TARGET DATE FOR ALL LONG TERM GOALS: 03/02/2020     PT LONG TERM GOAL #2   Title Demonstrate improved FOTO score to 67 or greater to demonstrate improvement in overall condition and self-reported functional ability.    Baseline 54 (12/09/2019); too early to be appropriate for re-measure for this condition (12/20/2019);    Time 12    Period Weeks    Status On-going      PT LONG TERM GOAL #3   Title Patient will demonstrate L ankle PROM equal or greater than R to allow successfull return to ambulatory actiivties without boot or assistive device.    Baseline very limited compared to  right (12/09/2019); improving per observation (12/20/2019; 12/22/2019); improving - see objective exam (12/29/2019);    Time 12    Period Weeks    Status Partially Met      PT LONG TERM GOAL #4   Title Patient will demonstrate equal or greater than 4+/5 MMT left ankle strength to allow patient to ambulate with normal gait and complete work and functional activities that require weight bearing.    Baseline unable to test due to hypersensitivity and fear (12/09/2019); improving tolerance to isometric exercise (12/20/2019; 12/22/2019); improving - see objective exam (12/29/2019);    Time 12    Period Weeks    Status Partially Met      PT LONG TERM GOAL #5   Title Complete community, work and/or recreational activities without limitation due to current condition.    Baseline difficulty with all weight bearing activities including household and community ambulation, going to [employer], stairs, visiting friends, social activities, playing for and caring for children, standing, working, bowling; currently not working (12/09/2019); continues to have similar limitations but is progressing appropriately at this point (12/20/2019; 12/22/2019); weaining out of boot - continues to have similar restrictions at this point (12/29/2019);    Time 12    Period Weeks    Status On-going                 Plan - 01/05/20 1618    Clinical Impression Statement Patient tolerated treatment well and demonstrated improved ambulation and reported no pain by end of session. Patient had difficulty tolerating scar massage over distal aspect of incision and was limited by pain with strong overpressure at end range ankle dorsiflexion and plantarflexion. Does show improving ROM overall and reported little pain with passive interventions. Patient continues to have low tolerance for strengthening, which affects her ability to ambulate freely. Patient would benefit from continued management of limiting condition by skilled physical therapist  to address remaining impairments and functional limitations to work towards stated goals and return to PLOF or maximal functional independence.    Personal Factors and Comorbidities Age;Behavior Pattern;Comorbidity 3+    Comorbidities Relevant past medical history and comorbidities include severe uterine prolapse (sx planned soon, no lifting over 15 lbs), hernia repair in the last year,  current smoker, cholecystectomy, GERD, headache, neck pain, asthma .    Examination-Activity Limitations Bed Mobility;Bathing;Lift;Stand;Locomotion Level;Toileting;Bend;Transfers;Caring for Others;Carry;Sit;Dressing;Squat;Hygiene/Grooming;Stairs    Examination-Participation Restrictions Yard Work;Cleaning;Meal Prep;Community Activity;Driving;Interpersonal Relationship;Laundry;Volunteer;Other   work, Environmental consultant, social participation, playing with and caring for children   Stability/Clinical Decision Making Evolving/Moderate complexity    Rehab Potential Good    PT Frequency 2x / week    PT Duration 12 weeks    PT Treatment/Interventions ADLs/Self Care Home Management;Aquatic Therapy;Biofeedback;Cryotherapy;Electrical Stimulation;Moist Heat;DME Instruction;Gait training;Stair training;Functional mobility training;Therapeutic activities;Therapeutic exercise;Balance training;Neuromuscular re-education;Patient/family education;Orthotic Fit/Training;Manual techniques;Compression bandaging;Scar mobilization;Passive range of motion;Dry needling;Splinting;Visual/perceptual remediation/compensation;Spinal Manipulations;Taping;Joint Manipulations    PT Next Visit Plan intrinsic foot exercises, ROM, strengthening as tolerated and within known surgeon's precautions    PT Home Exercise Plan Medbridge Access Code: [email or web address]    Consulted and Agree with Plan of Care Patient           Patient will benefit from skilled therapeutic intervention in order to improve the following deficits and impairments:  Abnormal gait, Decreased skin  integrity, Decreased knowledge of use of DME, Increased fascial restricitons, Pain, Improper body mechanics, Decreased scar mobility, Decreased mobility, Decreased coordination, Decreased activity tolerance, Decreased endurance, Decreased range of motion, Decreased strength, Hypomobility, Impaired perceived functional ability, Impaired flexibility, Increased edema, Difficulty walking, Decreased balance  Visit Diagnosis: Pain in left ankle and joints of left foot  Stiffness of left ankle, not elsewhere classified  Muscle weakness (generalized)  Difficulty in walking, not elsewhere classified     Problem List Patient Active Problem List   Diagnosis Date Noted  . Incisional hernia, without obstruction or gangrene 02/21/2019  . Rectus diastasis 02/21/2019  . Ectopic pregnancy 06/20/2017  . Post-op pain 06/20/2017  . Cesarean delivery, delivered, current hospitalization 08/12/2016  . Term birth of newborn female 02/13/2011   Everlean Alstrom. Graylon Good, PT, DPT 01/05/20, 4:18 PM  [facility] [address] [place], [address] Phone: [phone]   Fax:  [phone]  Name: PRABHLEEN MONTEMAYOR MRN: [id number] Date of Birth: [birth year]

## 2020-01-10 ENCOUNTER — Ambulatory Visit: Payer: Medicaid Other | Admitting: Physical Therapy

## 2020-01-12 ENCOUNTER — Ambulatory Visit: Payer: Medicaid Other | Admitting: Physical Therapy

## 2020-01-19 ENCOUNTER — Other Ambulatory Visit: Payer: Self-pay

## 2020-01-19 ENCOUNTER — Encounter: Payer: Self-pay | Admitting: Physical Therapy

## 2020-01-19 ENCOUNTER — Ambulatory Visit: Payer: Medicaid Other | Admitting: Physical Therapy

## 2020-01-19 DIAGNOSIS — M25572 Pain in left ankle and joints of left foot: Secondary | ICD-10-CM | POA: Diagnosis not present

## 2020-01-19 DIAGNOSIS — M25672 Stiffness of left ankle, not elsewhere classified: Secondary | ICD-10-CM

## 2020-01-19 DIAGNOSIS — M6281 Muscle weakness (generalized): Secondary | ICD-10-CM

## 2020-01-19 DIAGNOSIS — R262 Difficulty in walking, not elsewhere classified: Secondary | ICD-10-CM

## 2020-01-19 NOTE — Therapy (Signed)
[facility] [address] [place], [address] Phone: [phone]   Fax:  [phone]  Physical Therapy Treatment  Patient Details  Name: Christy Jones MRN: [id number] Date of Birth: 1986-01-18 Referring Provider (PT): Edrick Kins, [place]   Encounter Date: 01/19/2020   PT End of Session - 01/19/20 1416    Visit Number 8    Number of Visits 18    Date for PT Re-Evaluation 03/02/20    Authorization Type Medicaid reporting period from 12/09/2019    Authorization Time Period Medicaid auth visits 01/05/20 - 02/11/20 [id number] (unattended estim was denied)    Authorization - Visit Number 4    Progress Note Due on Visit 10    PT Start Time 1350    PT Stop Time 1430    PT Time Calculation (min) 40 min    Activity Tolerance Patient tolerated treatment well;Patient limited by pain    Behavior During Therapy [facility] for tasks assessed/performed           Past Medical History:  Diagnosis Date  . Asthma   . Chlamydia   . GERD (gastroesophageal reflux disease)   . Gestational diabetes    with pregnancy x2  . Headache(784.0)   . Pregnancy induced hypertension    post- partem 2012  . Sickle cell trait ([place])   . Trichomonas   . Umbilical hernia   . Urinary tract infection     Past Surgical History:  Procedure Laterality Date  . CESAREAN SECTION N/A 08/11/2016   Procedure: CESAREAN SECTION;  Surgeon: Vanessa Kick, MD;  Location: [facility];  Service: Obstetrics;  Laterality: N/A;  . CHOLECYSTECTOMY N/A 07/30/2012   Procedure: LAPAROSCOPIC CHOLECYSTECTOMY WITH INTRAOPERATIVE CHOLANGIOGRAM;  Surgeon: Adin Hector, MD;  Location: [facility];  Service: General;  Laterality: N/A;  . DIAGNOSTIC LAPAROSCOPY WITH REMOVAL OF ECTOPIC PREGNANCY N/A 06/20/2017   Procedure: DIAGNOSTIC LAPAROSCOPY WITH REMOVAL OF ECTOPIC PREGNANCY;  Surgeon: Lavonia Drafts, MD;  Location: [facility];  Service: Gynecology;  Laterality: N/A;  .  EXPLORATORY LAPAROTOMY     removal of ectopic pregnancy  . INCISIONAL HERNIA REPAIR N/A 02/25/2019   Procedure: OPEN INCISIONAL HERNIA REPAIR WITH MESH PATCH;  Surgeon: Armandina Gemma, MD;  Location: [facility];  Service: General;  Laterality: N/A;    There were no vitals filed for this visit.   Subjective Assessment - 01/19/20 1350    Subjective Patient reports her grandmother passed away and she has been away from PT for a couple of weeks and up doing a lot of extra things like shopping, etc. without the boot and her foot has swelled up several times. She did wear the boot a little bit for support during that time. She came in with her shoes on. She rates her pain at 5-6/10 in the left ankle and heel. the pain has been pretty constant in the ankle. She states her foot felt a little better following last session but later on that night it hurt more.    Pertinent History Patient is a 34 y.o. female who presents to outpatient physical therapy with a referral for medical diagnosis left plantar fasciitis, s/p left foot surgery (endoscopic plantar fasciotomy and tarsal exostectomy 10/07/19). This patient's chief complaints consist of left foot and ankle pain, stiffness, poor weight bearing tolerance leading to the following functional deficits: difficulty with all weight bearing activities including household and community ambulation, going to [employer], stairs, visiting friends, social  activities, playing for and caring for children, standing, working, bowling.. Relevant past medical history and comorbidities include severe uterine prolapse (sx planned soon, no lifting over 15 lbs), hernia repair in the last year, current smoker, cholecystectomy, GERD, headache, neck pain, asthma .  Patient denies hx of cancer, stroke, seizures, lung problem, major cardiac events, diabetes, unexplained weight loss, changes in bowel or bladder problems.    Limitations Standing;Walking;House hold activities;Other  (comment)   work, anything that requires weight bearing, bowling   Diagnostic tests Radiograph 10/15/19: "Osteotomies sites appear to be stable with routine healing"    Patient Stated Goals to get better    Currently in Pain? Yes    Pain Score 5     Pain Location Ankle    Pain Orientation Left    Pain Onset More than a month ago           OBJECTIVE:  s/p left foot surgery4/15/2021(endoscopic plantar fasciotomy and tarsal exostectomy - along medial malleolous)  7/12//2021: Patient reports Dr. [name] wants her to walk into the clinic out of a boot when he sees her again.  TREATMENT:  Manual therapy: to reduce pain and tissue tension, improve range of motion, neuromodulation, in order to promote improved ability to complete functional activities.  Shoe/Boot Doffed:  PRONE  - light to deep STM over left triceps surae gradually moving down toward the Achilles region and the plantar surface of the foot.  - scar massage over incision at medial left ankle as tolerated.  - Joint mobilizations grade I-IV at L talocrual anterior and posterior glides, subtalar lateral glides.  - PROM with overpressure to tolerance in ankle plantarflexion, dorsiflexion, inversion.   SUPINE - joint mobilization to L ankle posterior talocrual glide grade II-IV with ankle dorsiflexion PROM with overpressure as tolerated.  - L ankle dorsiflexion PROM stretch with overpressure as tolerated without joint mob.   Therapeutic exercise: to centralize symptoms and improve ROM, strength, muscular endurance, and activity tolerance required for successful completion of functional activities.  - long sitting ankle 4-way band exercises with yellow theraband tied in a loop around forefoot: 1x20 each, performed very slowly by patient. Plantar flexion, dorsiflexion, eversion, inversion(PT holding band).  - Education on HEP including handout    HOME EXERCISE PROGRAM Access Code: [email or web address] URL:  [email or web address] Date: 01/19/2020 Prepared by: [name]  Exercises Standing Gastroc Stretch - 1 x daily - 1 sets - 3 reps - 30 seconds hold Standing Ankle Dorsiflexion Stretch on Chair - 1 x daily - 1 sets - 3 reps - 30 seconds hold Seated Figure 4 Ankle Inversion with Resistance - 4 x weekly - 3 sets - 20 reps - 1 second hold Seated Ankle Eversion with Resistance - 4 x weekly - 3 sets - 20 reps - 1 second hold Seated Ankle Plantarflexion with Resistance - 4 x weekly - 3 sets - 20 reps - 1 second hold Seated Ankle Dorsiflexion with Resistance - 4 x weekly - 3 sets - 20 reps - 1 second hold    PT Education - 01/19/20 1422    Education Details intervention purpose/form. Self management techniques    Person(s) Educated Patient    Methods Explanation;Demonstration;Tactile cues;Verbal cues    Comprehension Verbalized understanding;Returned demonstration;Verbal cues required;Tactile cues required;Need further instruction            PT Short Term Goals - 12/21/19 1809      PT SHORT TERM GOAL #1   Title Be independent with initial home exercise program  for self-management of symptoms.    Baseline Initial HEP provided at [facility] (12/09/2019);    Time 2    Period Weeks    Status Achieved    Target Date 12/23/19             PT Long Term Goals - 12/29/19 1153      PT LONG TERM GOAL #1   Title Be independent with a long-term home exercise program for self-management of symptoms.    Baseline Initial HEP provided at [facility] (12/09/2019); currently participating in recovery appropriate HEP (12/20/2019; 12/22/2019; 12/29/2019);    Time 12    Period Weeks    Status Partially Met   TARGET DATE FOR ALL LONG TERM GOALS: 03/02/2020     PT LONG TERM GOAL #2   Title Demonstrate improved FOTO score to 67 or greater to demonstrate improvement in overall condition and self-reported functional ability.    Baseline 54 (12/09/2019); too early to be appropriate for re-measure for this condition  (12/20/2019);    Time 12    Period Weeks    Status On-going      PT LONG TERM GOAL #3   Title Patient will demonstrate L ankle PROM equal or greater than R to allow successfull return to ambulatory actiivties without boot or assistive device.    Baseline very limited compared to right (12/09/2019); improving per observation (12/20/2019; 12/22/2019); improving - see objective exam (12/29/2019);    Time 12    Period Weeks    Status Partially Met      PT LONG TERM GOAL #4   Title Patient will demonstrate equal or greater than 4+/5 MMT left ankle strength to allow patient to ambulate with normal gait and complete work and functional activities that require weight bearing.    Baseline unable to test due to hypersensitivity and fear (12/09/2019); improving tolerance to isometric exercise (12/20/2019; 12/22/2019); improving - see objective exam (12/29/2019);    Time 12    Period Weeks    Status Partially Met      PT LONG TERM GOAL #5   Title Complete community, work and/or recreational activities without limitation due to current condition.    Baseline difficulty with all weight bearing activities including household and community ambulation, going to [employer], stairs, visiting friends, social activities, playing for and caring for children, standing, working, bowling; currently not working (12/09/2019); continues to have similar limitations but is progressing appropriately at this point (12/20/2019; 12/22/2019); weaining out of boot - continues to have similar restrictions at this point (12/29/2019);    Time 12    Period Weeks    Status On-going                 Plan - 01/19/20 1453    Clinical Impression Statement Patient tolerated treatment fair overall. Has been dealing with death in family and has not been to PT or doing HEP for the last two weeks combined with increased weight bearing activity out of boot. Mild swelling today, improving PROM, limited gains in strength. Requires extra time to  complete exercises due to slow velocity of movement. Patient would benefit from continued management of limiting condition by skilled physical therapist to address remaining impairments and functional limitations to work towards stated goals and return to PLOF or maximal functional independence.    Personal Factors and Comorbidities Age;Behavior Pattern;Comorbidity 3+    Comorbidities Relevant past medical history and comorbidities include severe uterine prolapse (sx planned soon, no lifting over 15 lbs), hernia repair  in the last year, current smoker, cholecystectomy, GERD, headache, neck pain, asthma .    Examination-Activity Limitations Bed Mobility;Bathing;Lift;Stand;Locomotion Level;Toileting;Bend;Transfers;Caring for Others;Carry;Sit;Dressing;Squat;Hygiene/Grooming;Stairs    Examination-Participation Restrictions Yard Work;Cleaning;Meal Prep;Community Activity;Driving;Interpersonal Relationship;Laundry;Volunteer;Other   work, Environmental consultant, social participation, playing with and caring for children   Stability/Clinical Decision Making Evolving/Moderate complexity    Rehab Potential Good    PT Frequency 2x / week    PT Duration 12 weeks    PT Treatment/Interventions ADLs/Self Care Home Management;Aquatic Therapy;Biofeedback;Cryotherapy;Electrical Stimulation;Moist Heat;DME Instruction;Gait training;Stair training;Functional mobility training;Therapeutic activities;Therapeutic exercise;Balance training;Neuromuscular re-education;Patient/family education;Orthotic Fit/Training;Manual techniques;Compression bandaging;Scar mobilization;Passive range of motion;Dry needling;Splinting;Visual/perceptual remediation/compensation;Spinal Manipulations;Taping;Joint Manipulations    PT Next Visit Plan intrinsic foot exercises, ROM, strengthening as tolerated and within known surgeon's precautions    PT Home Exercise Plan Medbridge Access Code: [email or web address]    Consulted and Agree with Plan of Care Patient            Patient will benefit from skilled therapeutic intervention in order to improve the following deficits and impairments:  Abnormal gait, Decreased skin integrity, Decreased knowledge of use of DME, Increased fascial restricitons, Pain, Improper body mechanics, Decreased scar mobility, Decreased mobility, Decreased coordination, Decreased activity tolerance, Decreased endurance, Decreased range of motion, Decreased strength, Hypomobility, Impaired perceived functional ability, Impaired flexibility, Increased edema, Difficulty walking, Decreased balance  Visit Diagnosis: Pain in left ankle and joints of left foot  Stiffness of left ankle, not elsewhere classified  Muscle weakness (generalized)  Difficulty in walking, not elsewhere classified     Problem List Patient Active Problem List   Diagnosis Date Noted  . Incisional hernia, without obstruction or gangrene 02/21/2019  . Rectus diastasis 02/21/2019  . Ectopic pregnancy 06/20/2017  . Post-op pain 06/20/2017  . Cesarean delivery, delivered, current hospitalization 08/12/2016  . Term birth of newborn female 02/13/2011    Everlean Alstrom. Graylon Good, PT, DPT 01/19/20, 2:54 PM  [facility] [address], [place], [address] Phone: [phone]   Fax:  [phone]  Name: Christy Jones MRN: [id number] Date of Birth: [birth year]

## 2020-01-24 ENCOUNTER — Other Ambulatory Visit: Payer: Self-pay

## 2020-01-24 ENCOUNTER — Ambulatory Visit: Payer: Medicaid Other | Attending: Podiatry | Admitting: Physical Therapy

## 2020-01-24 ENCOUNTER — Encounter: Payer: Self-pay | Admitting: Physical Therapy

## 2020-01-24 DIAGNOSIS — M25572 Pain in left ankle and joints of left foot: Secondary | ICD-10-CM | POA: Insufficient documentation

## 2020-01-24 DIAGNOSIS — M25672 Stiffness of left ankle, not elsewhere classified: Secondary | ICD-10-CM | POA: Insufficient documentation

## 2020-01-24 DIAGNOSIS — M6281 Muscle weakness (generalized): Secondary | ICD-10-CM | POA: Insufficient documentation

## 2020-01-24 DIAGNOSIS — R262 Difficulty in walking, not elsewhere classified: Secondary | ICD-10-CM | POA: Insufficient documentation

## 2020-01-24 NOTE — Therapy (Signed)
[facility] [address], [place], [address] Phone: [phone]   Fax:  [phone]  Physical Therapy Treatment  Patient Details  Name: Christy Jones MRN: [id number] Date of Birth: 1986-05-10 Referring Provider (PT): Edrick Kins, [place]   Encounter Date: 01/24/2020   PT End of Session - 01/24/20 1528    Visit Number 9    Number of Visits 18    Date for PT Re-Evaluation 03/02/20    Authorization Type Medicaid reporting period from 12/09/2019    Authorization Time Period Medicaid auth visits 01/05/20 - 02/11/20 [id number] (unattended estim was denied)    Authorization - Visit Number 5    Progress Note Due on Visit 10    PT Start Time 1515    PT Stop Time 9758    PT Time Calculation (min) 40 min    Activity Tolerance Patient tolerated treatment well;Patient limited by pain    Behavior During Therapy [facility] for tasks assessed/performed           Past Medical History:  Diagnosis Date   Asthma    Chlamydia    GERD (gastroesophageal reflux disease)    Gestational diabetes    with pregnancy x2   Headache(784.0)    Pregnancy induced hypertension    post- partem 2012   Sickle cell trait ([place])    Trichomonas    Umbilical hernia    Urinary tract infection     Past Surgical History:  Procedure Laterality Date   CESAREAN SECTION N/A 08/11/2016   Procedure: CESAREAN SECTION;  Surgeon: Vanessa Kick, MD;  Location: [place];  Service: Obstetrics;  Laterality: N/A;   CHOLECYSTECTOMY N/A 07/30/2012   Procedure: LAPAROSCOPIC CHOLECYSTECTOMY WITH INTRAOPERATIVE CHOLANGIOGRAM;  Surgeon: Adin Hector, MD;  Location: [place];  Service: General;  Laterality: N/A;   DIAGNOSTIC LAPAROSCOPY WITH REMOVAL OF ECTOPIC PREGNANCY N/A 06/20/2017   Procedure: DIAGNOSTIC LAPAROSCOPY WITH REMOVAL OF ECTOPIC PREGNANCY;  Surgeon: Lavonia Drafts, MD;  Location: [facility];  Service: Gynecology;  Laterality: N/A;    EXPLORATORY LAPAROTOMY     removal of ectopic pregnancy   INCISIONAL HERNIA REPAIR N/A 02/25/2019   Procedure: [place];  Surgeon: Armandina Gemma, MD;  Location: [place];  Service: General;  Laterality: N/A;    There were no vitals filed for this visit.   Subjective Assessment - 01/24/20 1519    Subjective Patient reports she had more pain following last treatement session until Friday. She reports 5/10 pain in her left ankle/foot more in the bottom of the foot. She feels this the most after she gets up following prolonged sitting. She has been very busy chasing her 35 year old child. She did her stretches but not the band exercises. She can go step over step up stairs but has trouble with step over step down stairs. Arrives in slip on sandles with nearly normal basic walking gait for slow speeds.    Pertinent History Patient is a 33 y.o. female who presents to outpatient physical therapy with a referral for medical diagnosis left plantar fasciitis, s/p left foot surgery (endoscopic plantar fasciotomy and tarsal exostectomy 10/07/19). This patient's chief complaints consist of left foot and ankle pain, stiffness, poor weight bearing tolerance leading to the following functional deficits: difficulty with all weight bearing activities including household and community ambulation, going to [employer], stairs, visiting friends, social activities, playing for and caring for children, standing, working, Environmental consultant.Marland Kitchen  Relevant past medical history and comorbidities include severe uterine prolapse (sx planned soon, no lifting over 15 lbs), hernia repair in the last year, current smoker, cholecystectomy, GERD, headache, neck pain, asthma .  Patient denies hx of cancer, stroke, seizures, lung problem, major cardiac events, diabetes, unexplained weight loss, changes in bowel or bladder problems.    Limitations Standing;Walking;House hold activities;Other (comment)    work, anything that requires weight bearing, bowling   Diagnostic tests Radiograph 10/15/19: "Osteotomies sites appear to be stable with routine healing"    Patient Stated Goals to get better    Currently in Pain? Yes    Pain Score 5     Pain Orientation Left    Pain Onset More than a month ago           OBJECTIVE:  s/p left foot surgery4/15/2021(endoscopic plantar fasciotomy and tarsal exostectomy - along medial malleolous)  7/12//2021: Patient reports Dr. [name] wants her to walk into the clinic out of a boot when he sees her again.  TREATMENT:  Therapeutic exercise:to centralize symptoms and improve ROM, strength, muscular endurance, and activity tolerance required for successful completion of functional activities.  - Seated left ankle BAPS board, level 3, plantarflexion/dorsiflexion, inversion/eversion, circles clockwise, circles counter clockwise, 20 reps each. Pt required cuing to keep knee still and instruction on how to perform exercise.  - short sitting ankle 4-way band exercises with yellow theraband tied in a loop around forefoot (Except PF with green theraband held at two ends): 1x20 each, performed very slowly by patient. Plantar flexion, dorsiflexion, eversion, inversion(PT holding band).  - side stepping with yellow theraband under forefeet. Back and forth x 2 min - SLS foot rounds CW and CCW with foot slider, x20 each direction each side. Unilateral UE support to touchdown support as able. Reports pain in sinus tarsi region of L foot.   HOME EXERCISE PROGRAM Access Code: [email or web address] URL: [email or web address] Date: 01/19/2020 Prepared by: [name]  Exercises Standing Gastroc Stretch - 1 x daily - 1 sets - 3 reps - 30 seconds hold Standing Ankle Dorsiflexion Stretch on Chair - 1 x daily - 1 sets - 3 reps - 30 seconds hold Seated Figure 4 Ankle Inversion with Resistance - 4 x weekly - 3 sets - 20 reps - 1 second hold Seated Ankle Eversion with  Resistance - 4 x weekly - 3 sets - 20 reps - 1 second hold Seated Ankle Plantarflexion with Resistance - 4 x weekly - 3 sets - 20 reps - 1 second hold Seated Ankle Dorsiflexion with Resistance - 4 x weekly - 3 sets - 20 reps - 1 second hold     PT Education - 01/24/20 1528    Education Details intervention purpose/form. Self management techniques    Person(s) Educated Patient    Methods Explanation;Demonstration;Tactile cues;Verbal cues    Comprehension Verbalized understanding;Returned demonstration;Verbal cues required;Tactile cues required;Need further instruction            PT Short Term Goals - 12/21/19 1809      PT SHORT TERM GOAL #1   Title Be independent with initial home exercise program for self-management of symptoms.    Baseline Initial HEP provided at [facility] (12/09/2019);    Time 2    Period Weeks    Status Achieved    Target Date 12/23/19             PT Long Term Goals - 12/29/19 1153      PT LONG TERM GOAL #1  Title Be independent with a long-term home exercise program for self-management of symptoms.    Baseline Initial HEP provided at [facility] (12/09/2019); currently participating in recovery appropriate HEP (12/20/2019; 12/22/2019; 12/29/2019);    Time 12    Period Weeks    Status Partially Met   TARGET DATE FOR ALL LONG TERM GOALS: 03/02/2020     PT LONG TERM GOAL #2   Title Demonstrate improved FOTO score to 67 or greater to demonstrate improvement in overall condition and self-reported functional ability.    Baseline 54 (12/09/2019); too early to be appropriate for re-measure for this condition (12/20/2019);    Time 12    Period Weeks    Status On-going      PT LONG TERM GOAL #3   Title Patient will demonstrate L ankle PROM equal or greater than R to allow successfull return to ambulatory actiivties without boot or assistive device.    Baseline very limited compared to right (12/09/2019); improving per observation (12/20/2019; 12/22/2019); improving - see objective  exam (12/29/2019);    Time 12    Period Weeks    Status Partially Met      PT LONG TERM GOAL #4   Title Patient will demonstrate equal or greater than 4+/5 MMT left ankle strength to allow patient to ambulate with normal gait and complete work and functional activities that require weight bearing.    Baseline unable to test due to hypersensitivity and fear (12/09/2019); improving tolerance to isometric exercise (12/20/2019; 12/22/2019); improving - see objective exam (12/29/2019);    Time 12    Period Weeks    Status Partially Met      PT LONG TERM GOAL #5   Title Complete community, work and/or recreational activities without limitation due to current condition.    Baseline difficulty with all weight bearing activities including household and community ambulation, going to [employer], stairs, visiting friends, social activities, playing for and caring for children, standing, working, bowling; currently not working (12/09/2019); continues to have similar limitations but is progressing appropriately at this point (12/20/2019; 12/22/2019); weaining out of boot - continues to have similar restrictions at this point (12/29/2019);    Time 12    Period Weeks    Status On-going                 Plan - 01/24/20 1836    Clinical Impression Statement Patient tolerated treatment well overall but did report increased pain near sinus tarsi during SLS weight bearing L LE exercises. Continues to require additional time to complete exercises due to slow velocity of movement, although improved slightly this session. No manual therapy this session due to strengthening exercises taking precedent. Noted for improved gait pattern this session. Patient would benefit from continued management of limiting condition by skilled physical therapist to address remaining impairments and functional limitations to work towards stated goals and return to PLOF or maximal functional independence.    Personal Factors and Comorbidities  Age;Behavior Pattern;Comorbidity 3+    Comorbidities Relevant past medical history and comorbidities include severe uterine prolapse (sx planned soon, no lifting over 15 lbs), hernia repair in the last year, current smoker, cholecystectomy, GERD, headache, neck pain, asthma .    Examination-Activity Limitations Bed Mobility;Bathing;Lift;Stand;Locomotion Level;Toileting;Bend;Transfers;Caring for Others;Carry;Sit;Dressing;Squat;Hygiene/Grooming;Stairs    Examination-Participation Restrictions Yard Work;Cleaning;Meal Prep;Community Activity;Driving;Interpersonal Relationship;Laundry;Volunteer;Other   work, Environmental consultant, social participation, playing with and caring for children   Stability/Clinical Decision Making Evolving/Moderate complexity    Rehab Potential Good    PT Frequency 2x /  week    PT Duration 12 weeks    PT Treatment/Interventions ADLs/Self Care Home Management;Aquatic Therapy;Biofeedback;Cryotherapy;Electrical Stimulation;Moist Heat;DME Instruction;Gait training;Stair training;Functional mobility training;Therapeutic activities;Therapeutic exercise;Balance training;Neuromuscular re-education;Patient/family education;Orthotic Fit/Training;Manual techniques;Compression bandaging;Scar mobilization;Passive range of motion;Dry needling;Splinting;Visual/perceptual remediation/compensation;Spinal Manipulations;Taping;Joint Manipulations    PT Next Visit Plan intrinsic foot exercises, ROM, strengthening as tolerated and within known surgeon's precautions    PT Home Exercise Plan Medbridge Access Code: [email or web address]    Consulted and Agree with Plan of Care Patient           Patient will benefit from skilled therapeutic intervention in order to improve the following deficits and impairments:  Abnormal gait, Decreased skin integrity, Decreased knowledge of use of DME, Increased fascial restricitons, Pain, Improper body mechanics, Decreased scar mobility, Decreased mobility, Decreased coordination, Decreased  activity tolerance, Decreased endurance, Decreased range of motion, Decreased strength, Hypomobility, Impaired perceived functional ability, Impaired flexibility, Increased edema, Difficulty walking, Decreased balance  Visit Diagnosis: Pain in left ankle and joints of left foot  Stiffness of left ankle, not elsewhere classified  Muscle weakness (generalized)  Difficulty in walking, not elsewhere classified     Problem List Patient Active Problem List   Diagnosis Date Noted   Incisional hernia, without obstruction or gangrene 02/21/2019   Rectus diastasis 02/21/2019   Ectopic pregnancy 06/20/2017   Post-op pain 06/20/2017   Cesarean delivery, delivered, current hospitalization 08/12/2016   Term birth of newborn female 02/13/2011   Everlean Alstrom. Graylon Good, PT, DPT 01/24/20, 6:40 PM  [facility] [address], [place], [address] Phone: [phone]   Fax:  [phone]  Name: SHAYLA HEMING MRN: [id number] Date of Birth: [birth year]

## 2020-01-26 ENCOUNTER — Ambulatory Visit: Payer: Medicaid Other | Admitting: Physical Therapy

## 2020-01-26 ENCOUNTER — Encounter: Payer: Self-pay | Admitting: Physical Therapy

## 2020-01-26 ENCOUNTER — Other Ambulatory Visit: Payer: Self-pay

## 2020-01-26 DIAGNOSIS — M25572 Pain in left ankle and joints of left foot: Secondary | ICD-10-CM | POA: Diagnosis not present

## 2020-01-26 DIAGNOSIS — R262 Difficulty in walking, not elsewhere classified: Secondary | ICD-10-CM

## 2020-01-26 DIAGNOSIS — M25672 Stiffness of left ankle, not elsewhere classified: Secondary | ICD-10-CM

## 2020-01-26 DIAGNOSIS — M6281 Muscle weakness (generalized): Secondary | ICD-10-CM

## 2020-01-26 NOTE — Therapy (Signed)
[facility] [address] [place], [address] Phone: [phone]   Fax:  [phone]  Physical Therapy Treatment / Progress Note Reporting period: 12/09/2019 - 01/27/2020  Patient Details  Name: Christy Jones MRN: [id number] Date of Birth: 07-Mar-1986 Referring Provider (PT): Edrick Kins, [place]   Encounter Date: 01/26/2020   PT End of Session - 01/26/20 1325    Visit Number 10    Number of Visits 18    Date for PT Re-Evaluation 03/02/20    Authorization Type Medicaid reporting period from 12/09/2019    Authorization Time Period Medicaid auth visits 01/05/20 - 02/11/20 [id number] (unattended estim was denied)    Authorization - Visit Number 6    Progress Note Due on Visit 10    PT Start Time 1319    PT Stop Time 1359    PT Time Calculation (min) 40 min    Activity Tolerance Patient tolerated treatment well;Patient limited by pain    Behavior During Therapy [facility] for tasks assessed/performed           Past Medical History:  Diagnosis Date  . Asthma   . Chlamydia   . GERD (gastroesophageal reflux disease)   . Gestational diabetes    with pregnancy x2  . Headache(784.0)   . Pregnancy induced hypertension    post- partem 2012  . Sickle cell trait ([place])   . Trichomonas   . Umbilical hernia   . Urinary tract infection     Past Surgical History:  Procedure Laterality Date  . CESAREAN SECTION N/A 08/11/2016   Procedure: CESAREAN SECTION;  Surgeon: Vanessa Kick, MD;  Location: [facility];  Service: Obstetrics;  Laterality: N/A;  . CHOLECYSTECTOMY N/A 07/30/2012   Procedure: LAPAROSCOPIC CHOLECYSTECTOMY WITH INTRAOPERATIVE CHOLANGIOGRAM;  Surgeon: Adin Hector, MD;  Location: [facility];  Service: General;  Laterality: N/A;  . DIAGNOSTIC LAPAROSCOPY WITH REMOVAL OF ECTOPIC PREGNANCY N/A 06/20/2017   Procedure: DIAGNOSTIC LAPAROSCOPY WITH REMOVAL OF ECTOPIC PREGNANCY;  Surgeon: Lavonia Drafts, MD;   Location: [facility];  Service: Gynecology;  Laterality: N/A;  . EXPLORATORY LAPAROTOMY     removal of ectopic pregnancy  . INCISIONAL HERNIA REPAIR N/A 02/25/2019   Procedure: OPEN INCISIONAL HERNIA REPAIR WITH MESH PATCH;  Surgeon: Armandina Gemma, MD;  Location: [facility];  Service: General;  Laterality: N/A;    There were no vitals filed for this visit.   Subjective Assessment - 01/26/20 1321    Subjective Patient reports she is 5/10 over the anterior L ankle and near incision. She was very sore after last session, but better by next day. She is not sleeping well related to a lot of issues but only occasionally because of her ankle. She feels like PT is helping her and she noticise improvements in her walk.    Pertinent History Patient is a 34 y.o. female who presents to outpatient physical therapy with a referral for medical diagnosis left plantar fasciitis, s/p left foot surgery (endoscopic plantar fasciotomy and tarsal exostectomy 10/07/19). This patient's chief complaints consist of left foot and ankle pain, stiffness, poor weight bearing tolerance leading to the following functional deficits: difficulty with all weight bearing activities including household and community ambulation, going to [employer], stairs, visiting friends, social activities, playing for and caring for children, standing, working, Environmental consultant.. Relevant past medical history and comorbidities include severe uterine prolapse (sx planned soon, no lifting over 15 lbs), hernia repair in the last year, current  smoker, cholecystectomy, GERD, headache, neck pain, asthma .  Patient denies hx of cancer, stroke, seizures, lung problem, major cardiac events, diabetes, unexplained weight loss, changes in bowel or bladder problems.    Limitations Standing;Walking;House hold activities;Other (comment)   work, anything that requires weight bearing, bowling   Diagnostic tests Radiograph 10/15/19: "Osteotomies sites appear to be stable  with routine healing"    Patient Stated Goals to get better    Currently in Pain? Yes    Pain Score 5     Pain Location Ankle    Pain Orientation Left    Pain Descriptors / Indicators Tingling;Burning;Aching    Pain Type Surgical pain    Pain Radiating Towards tingling at medial plantar aspect of foot    Pain Onset More than a month ago    Effect of Pain on Daily Activities household and community ambulation, going to the beach, stairs, visiting friends, social activities, playing for and caring for children, standing, working, bowling. (improved but not back to normal)            OBJECTIVE  OBSERVATION/INSPECTION  Tremor: none  Muscle bulk: significant atrophy noted in left calf and less so in thigh.   Edema: mild edema present in left ankle region with more prominent edema at plantar surface of foot just distal to calcaneous.   Skin: The incision sites appear to be healing well with no excessive redness, warmth, drainage or signs of infection present.    Bed mobility: WFL   Transfers: sit <> stand  WFL with some avoidance of L LE.   Gait: ambulated 969 feet with slow pace limited R step length and minimal lift of left heel at toe off.   Stairs: ascended step over step, descended step to gait leading with L (used B UE support on the way down).   CIRCUMFERENCE MEASUREMENTS  Calf circumference (largest spot):  R = 39 cm L = 35 cm  PERIPHERAL JOINT MOTION (in degrees)  Active Range of Motion (AROM) *Indicates pain 12/09/19 01/26/2020 Date  Joint/Motion R/L R/L R/L  Ankle/Foot     Dorsiflexion (knee flex) 10/-8* /0* /  Plantarflexion 60/30* /35* /  Everison 12/0* /20* /  Inversion 30/0* /10* /  Great toe extension 70/52* /60* /  Comments:  12/09/2019: Hip and knee ROM appear to be [facility] for basic tasks bilaterally.   Passive Range of Motion (PROM) *Indicates pain 12/09/19 01/26/20 Date  Joint/Motion R/L R/L R/L  Ankle/Foot     Dorsiflexion (knee flex) 10/  30/25* /  Great toe extension 90/ 90/84* /  Comments:  12/09/2019: Some testing deferred due to hypersensitivity and apprehension with touch at the left ankle/foot. B hip and knees and R ankle/foot appear WFL for basic tasks 01/26/2020: L ankle lacking about 25% of R ankle motion with pain increasing at end range. DF completed in standing with inclinometer.   MUSCLE PERFORMANCE (MMT):  *Indicates pain 12/09/19 01/26/2020 Date  Joint/Motion R/L R/L R/L  Hip     Flexion 5/5 / /  Extension (knee ext) 5/5 / /  Abduction 5/4+ / /  Adduction 4+/4 / /  Knee     Extension 5/4+ / /  Flexion 5/4** / /  Ankle/Foot     Dorsiflexion  / 4+/4* /  Great toe extension / 4+/4+* /  Eversion / 4+/4* /  Plantarflexion / 5/4+* /  Inversion / 4+/4* /  Pronation / 4+/4-* /  Great toe flexion / 5/4+* /  Comments:  12/09/19:  R ankle WFL, L ankle deferred due to hypersensitivity and apprehension. Pain over contact point at left calf with knee flexion testing.   ACCESSORY MOTION:   Hypomobile in L talocrual and subtalar joints  PALPATION:  TTP over incision and plantar surface of foot.   FUNCTIONAL TESTING:   6 Minute Walk Test: 969 feet with slow pace limited R step length and minimal lift of left heel at toe off.  EDUCATION/COGNITION: Patient is alert and oriented X 4.  TREATMENT:  s/p left foot surgery4/15/2021(endoscopic plantar fasciotomy and tarsal exostectomy - along medial malleolous)  7/12//2021: Patient reports Dr. [name] wants her to walk into the clinic out of a boot when he sees her again.  Therapeutic exercise:to centralize symptoms and improve ROM, strength, muscular endurance, and activity tolerance required for successful completion of functional activities. - measurements to assess progress (ROM, MMT, circumference).  -short sittingankle 4-way band exercises with yellow theraband tied in a loop around forefoot (Except PF with green theraband held at two ends):  1x20 each, performed very slowly by patient. Plantar flexion, dorsiflexion, eversion, inversion(PT holding band). - ambulation around clinic at self-selected pace for 6MWT with a total of  feet.  - Education on diagnosis, prognosis, POC, anatomy and physiology of current condition.   HOME EXERCISE PROGRAM Access Code: [email or web address] URL: [email or web address] Date: 01/19/2020 Prepared by: [name]  Exercises Standing Gastroc Stretch - 1 x daily - 1 sets - 3 reps - 30 seconds hold Standing Ankle Dorsiflexion Stretch on Chair - 1 x daily - 1 sets - 3 reps - 30 seconds hold Seated Figure 4 Ankle Inversion with Resistance - 4 x weekly - 3 sets - 20 reps - 1 second hold Seated Ankle Eversion with Resistance - 4 x weekly - 3 sets - 20 reps - 1 second hold Seated Ankle Plantarflexion with Resistance - 4 x weekly - 3 sets - 20 reps - 1 second hold Seated Ankle Dorsiflexion with Resistance - 4 x weekly - 3 sets - 20 reps - 1 second hold     PT Education - 01/26/20 1324    Education Details intervention purpose/form. Self management techniques    Person(s) Educated Patient    Methods Explanation;Demonstration;Tactile cues;Verbal cues    Comprehension Verbalized understanding;Verbal cues required;Returned demonstration;Tactile cues required;Need further instruction            PT Short Term Goals - 12/21/19 1809      PT SHORT TERM GOAL #1   Title Be independent with initial home exercise program for self-management of symptoms.    Baseline Initial HEP provided at IE (12/09/2019);    Time 2    Period Weeks    Status Achieved    Target Date 12/23/19             PT Long Term Goals - 01/27/20 1304      PT LONG TERM GOAL #1   Title Be independent with a long-term home exercise program for self-management of symptoms.    Baseline Initial HEP provided at IE (12/09/2019); currently participating in recovery appropriate HEP (12/20/2019; 12/22/2019; 12/29/2019); partially  participating (01/26/2020);    Time 12    Period Weeks    Status Partially Met   TARGET DATE FOR ALL LONG TERM GOALS: 03/02/2020     PT LONG TERM GOAL #2   Title Demonstrate improved FOTO score to 67 or greater to demonstrate improvement in overall condition and self-reported functional ability.    Baseline 54 (12/09/2019); too early to  be appropriate for re-measure for this condition (12/20/2019); did not capture (01/26/2020);    Time 12    Period Weeks    Status On-going      PT LONG TERM GOAL #3   Title Patient will demonstrate L ankle PROM equal or greater than R to allow successfull return to ambulatory actiivties without boot or assistive device.    Baseline very limited compared to right (12/09/2019); improving per observation (12/20/2019; 12/22/2019); improving - see objective exam (12/29/2019); improving but still lackas about 25% (01/26/2020);    Time 12    Period Weeks    Status Partially Met      PT LONG TERM GOAL #4   Title Patient will demonstrate equal or greater than 4+/5 MMT left ankle strength to allow patient to ambulate with normal gait and complete work and functional activities that require weight bearing.    Baseline unable to test due to hypersensitivity and fear (12/09/2019); improving tolerance to isometric exercise (12/20/2019; 12/22/2019); improving - see objective exam (12/29/2019); improving see objective exam (01/27/2020);    Time 12    Period Weeks    Status Partially Met      PT LONG TERM GOAL #5   Title Complete community, work and/or recreational activities without limitation due to current condition.    Baseline difficulty with all weight bearing activities including household and community ambulation, going to [employer], stairs, visiting friends, social activities, playing for and caring for children, standing, working, bowling; currently not working (12/09/2019); continues to have similar limitations but is progressing appropriately at this point (12/20/2019; 12/22/2019);  weaining out of boot - continues to have similar restrictions at this point (12/29/2019); improving but still limited in weight bearing activities (01/26/2020);    Time 12    Period Weeks    Status On-going                 Plan - 01/27/20 1309    Clinical Impression Statement Patient has attended 10 physical therapy sessions this episode of care and continues to make steady progress towards goals. Has improved significantly in weight bearing and activity tolerance, strength, and ROM since initial eval but still has significant impairments such as weakness, decreased activity tolerance, limited ROM and joint stiffness, muscle atrophy, imbalance, and lack of proprioception that continue to limit her ability to complete functional activities such as anything requiring weight bearing of the L LE, ambulation, lifting, working, etc. Patient would benefit from continued management of limiting condition by skilled physical therapist to address remaining impairments and functional limitations to work towards stated goals and return to PLOF or maximal functional independence    Personal Factors and Comorbidities Age;Behavior Pattern;Comorbidity 3+    Comorbidities Relevant past medical history and comorbidities include severe uterine prolapse (sx planned soon, no lifting over 15 lbs), hernia repair in the last year, current smoker, cholecystectomy, GERD, headache, neck pain, asthma .    Examination-Activity Limitations Bed Mobility;Bathing;Lift;Stand;Locomotion Level;Toileting;Bend;Transfers;Caring for Others;Carry;Sit;Dressing;Squat;Hygiene/Grooming;Stairs    Examination-Participation Restrictions Yard Work;Cleaning;Meal Prep;Community Activity;Driving;Interpersonal Relationship;Laundry;Volunteer;Other   work, Environmental consultant, social participation, playing with and caring for children   Stability/Clinical Decision Making Evolving/Moderate complexity    Rehab Potential Good    PT Frequency 2x / week    PT Duration  12 weeks    PT Treatment/Interventions ADLs/Self Care Home Management;Aquatic Therapy;Biofeedback;Cryotherapy;Electrical Stimulation;Moist Heat;DME Instruction;Gait training;Stair training;Functional mobility training;Therapeutic activities;Therapeutic exercise;Balance training;Neuromuscular re-education;Patient/family education;Orthotic Fit/Training;Manual techniques;Compression bandaging;Scar mobilization;Passive range of motion;Dry needling;Splinting;Visual/perceptual remediation/compensation;Spinal Manipulations;Taping;Joint Manipulations    PT Next Visit Plan  intrinsic foot exercises, ROM, strengthening as tolerated and within known surgeon's precautions    PT Home Exercise Plan Medbridge Access Code: [email or web address]    Consulted and Agree with Plan of Care Patient           Patient will benefit from skilled therapeutic intervention in order to improve the following deficits and impairments:  Abnormal gait, Decreased skin integrity, Decreased knowledge of use of DME, Increased fascial restricitons, Pain, Improper body mechanics, Decreased scar mobility, Decreased mobility, Decreased coordination, Decreased activity tolerance, Decreased endurance, Decreased range of motion, Decreased strength, Hypomobility, Impaired perceived functional ability, Impaired flexibility, Increased edema, Difficulty walking, Decreased balance  Visit Diagnosis: Pain in left ankle and joints of left foot  Stiffness of left ankle, not elsewhere classified  Muscle weakness (generalized)  Difficulty in walking, not elsewhere classified     Problem List Patient Active Problem List   Diagnosis Date Noted  . Incisional hernia, without obstruction or gangrene 02/21/2019  . Rectus diastasis 02/21/2019  . Ectopic pregnancy 06/20/2017  . Post-op pain 06/20/2017  . Cesarean delivery, delivered, current hospitalization 08/12/2016  . Term birth of newborn female 02/13/2011    Everlean Alstrom. Graylon Good, PT, DPT 01/27/20, 1:10  PM  [facility] [address], [place], [address] Phone: [phone]   Fax:  [phone]  Name: Christy Jones MRN: [id number] Date of Birth: 04-05-1986

## 2020-01-31 ENCOUNTER — Encounter: Payer: Self-pay | Admitting: Physical Therapy

## 2020-01-31 ENCOUNTER — Other Ambulatory Visit: Payer: Self-pay

## 2020-01-31 ENCOUNTER — Ambulatory Visit: Payer: Medicaid Other | Admitting: Physical Therapy

## 2020-01-31 DIAGNOSIS — M25572 Pain in left ankle and joints of left foot: Secondary | ICD-10-CM | POA: Diagnosis not present

## 2020-01-31 DIAGNOSIS — M25672 Stiffness of left ankle, not elsewhere classified: Secondary | ICD-10-CM

## 2020-01-31 DIAGNOSIS — M6281 Muscle weakness (generalized): Secondary | ICD-10-CM

## 2020-01-31 DIAGNOSIS — R262 Difficulty in walking, not elsewhere classified: Secondary | ICD-10-CM

## 2020-01-31 NOTE — Therapy (Signed)
[facility] [address] [place], [address] Phone: [phone]   Fax:  [phone]  Physical Therapy Treatment  Patient Details  Name: Christy Jones MRN: [id number] Date of Birth: 1985-10-14 Referring Provider (PT): Edrick Kins, [place]   Encounter Date: 01/31/2020   PT End of Session - 01/31/20 1540    Visit Number 11    Number of Visits 18    Date for PT Re-Evaluation 03/02/20    Authorization Type Medicaid reporting period from 12/09/2019    Authorization Time Period Medicaid auth visits 01/05/20 - 02/11/20 [id number] (unattended estim was denied)    Authorization - Visit Number 7    Progress Note Due on Visit 10    PT Start Time 1518    PT Stop Time 1558    PT Time Calculation (min) 40 min    Activity Tolerance Patient tolerated treatment well;Patient limited by pain    Behavior During Therapy [facility] for tasks assessed/performed           Past Medical History:  Diagnosis Date  . Asthma   . Chlamydia   . GERD (gastroesophageal reflux disease)   . Gestational diabetes    with pregnancy x2  . Headache(784.0)   . Pregnancy induced hypertension    post- partem 2012  . Sickle cell trait ([facility])   . Trichomonas   . Umbilical hernia   . Urinary tract infection     Past Surgical History:  Procedure Laterality Date  . CESAREAN SECTION N/A 08/11/2016   Procedure: CESAREAN SECTION;  Surgeon: Vanessa Kick, MD;  Location: [facility];  Service: Obstetrics;  Laterality: N/A;  . CHOLECYSTECTOMY N/A 07/30/2012   Procedure: LAPAROSCOPIC CHOLECYSTECTOMY WITH INTRAOPERATIVE CHOLANGIOGRAM;  Surgeon: Adin Hector, MD;  Location: [facility];  Service: General;  Laterality: N/A;  . DIAGNOSTIC LAPAROSCOPY WITH REMOVAL OF ECTOPIC PREGNANCY N/A 06/20/2017   Procedure: DIAGNOSTIC LAPAROSCOPY WITH REMOVAL OF ECTOPIC PREGNANCY;  Surgeon: Lavonia Drafts, MD;  Location: [facility];  Service: Gynecology;  Laterality: N/A;  .  EXPLORATORY LAPAROTOMY     removal of ectopic pregnancy  . INCISIONAL HERNIA REPAIR N/A 02/25/2019   Procedure: OPEN INCISIONAL HERNIA REPAIR WITH MESH PATCH;  Surgeon: Armandina Gemma, MD;  Location: [facility];  Service: General;  Laterality: N/A;    There were no vitals filed for this visit.   Subjective Assessment - 01/31/20 1520    Subjective Patient reports she had a bad weekend because she got her first COVID19 vaccine on Thursday morning. She got chills and pain but is starting to feel better. Her ankle is feeling okay today. She has pain of 5/10 in her left ankle and she felt like this was    Pertinent History Patient is a 34 y.o. female who presents to outpatient physical therapy with a referral for medical diagnosis left plantar fasciitis, s/p left foot surgery (endoscopic plantar fasciotomy and tarsal exostectomy 10/07/19). This patient's chief complaints consist of left foot and ankle pain, stiffness, poor weight bearing tolerance leading to the following functional deficits: difficulty with all weight bearing activities including household and community ambulation, going to [employer], stairs, visiting friends, social activities, playing for and caring for children, standing, working, Environmental consultant.. Relevant past medical history and comorbidities include severe uterine prolapse (sx planned soon, no lifting over 15 lbs), hernia repair in the last year, current smoker, cholecystectomy, GERD, headache, neck pain, asthma .  Patient denies hx of cancer, stroke, seizures,  lung problem, major cardiac events, diabetes, unexplained weight loss, changes in bowel or bladder problems.    Limitations Standing;Walking;House hold activities;Other (comment)   work, anything that requires weight bearing, bowling   Diagnostic tests Radiograph 10/15/19: "Osteotomies sites appear to be stable with routine healing"    Patient Stated Goals to get better    Currently in Pain? Yes    Pain Score 5     Pain  Location Ankle    Pain Orientation Left    Pain Onset More than a month ago           TREATMENT:  s/p left foot surgery4/15/2021(endoscopic plantar fasciotomy and tarsal exostectomy - along medial malleolous)  7/12//2021: Patient reports Dr. [name] wants her to walk into the clinic out of a boot when he sees her again.  Therapeutic exercise:to centralize symptoms and improve ROM, strength, muscular endurance, and activity tolerance required for successful completion of functional activities. -shortsittingankle 4-way band exercises with yellow theraband tied in a loop around forefoot(Except PF with green theraband held at two ends): 1x20 each, performed very slowly by patient. Plantar flexion, dorsiflexion, eversion, inversion (PT holding band). - SLS foot rounds front-side-back with foot slider, 3x10 each outwards direction. Unilateral UE support to touchdown support as able. Reports pain in sinus tarsi region of L foot.  - side stepping with yellow theraband under forefeet. Back and forth x 2 min  Pt required multimodal cuing for proper technique and to facilitate improved neuromuscular control, strength, range of motion, and functional ability resulting in improved performance and form.   HOME EXERCISE PROGRAM Access Code: [email or web address] URL: [email or web address] Date: 01/19/2020 Prepared by: [name]  Exercises Standing Gastroc Stretch - 1 x daily - 1 sets - 3 reps - 30 seconds hold Standing Ankle Dorsiflexion Stretch on Chair - 1 x daily - 1 sets - 3 reps - 30 seconds hold Seated Figure 4 Ankle Inversion with Resistance - 4 x weekly - 3 sets - 20 reps - 1 second hold Seated Ankle Eversion with Resistance - 4 x weekly - 3 sets - 20 reps - 1 second hold Seated Ankle Plantarflexion with Resistance - 4 x weekly - 3 sets - 20 reps - 1 second hold Seated Ankle Dorsiflexion with Resistance - 4 x weekly - 3 sets - 20 reps - 1 second hold     PT Education -  01/31/20 1537    Education Details intervention purpose/form. Self management techniques    Person(s) Educated Patient    Methods Explanation;Demonstration;Tactile cues;Verbal cues    Comprehension Verbalized understanding;Returned demonstration;Verbal cues required;Tactile cues required            PT Short Term Goals - 12/21/19 1809      PT SHORT TERM GOAL #1   Title Be independent with initial home exercise program for self-management of symptoms.    Baseline Initial HEP provided at [facility] (12/09/2019);    Time 2    Period Weeks    Status Achieved    Target Date 12/23/19             PT Long Term Goals - 01/27/20 1304      PT LONG TERM GOAL #1   Title Be independent with a long-term home exercise program for self-management of symptoms.    Baseline Initial HEP provided at [facility] (12/09/2019); currently participating in recovery appropriate HEP (12/20/2019; 12/22/2019; 12/29/2019); partially participating (01/26/2020);    Time 12    Period Weeks    Status Partially Met  TARGET DATE FOR ALL LONG TERM GOALS: 03/02/2020     PT LONG TERM GOAL #2   Title Demonstrate improved FOTO score to 67 or greater to demonstrate improvement in overall condition and self-reported functional ability.    Baseline 54 (12/09/2019); too early to be appropriate for re-measure for this condition (12/20/2019); did not capture (01/26/2020);    Time 12    Period Weeks    Status On-going      PT LONG TERM GOAL #3   Title Patient will demonstrate L ankle PROM equal or greater than R to allow successfull return to ambulatory actiivties without boot or assistive device.    Baseline very limited compared to right (12/09/2019); improving per observation (12/20/2019; 12/22/2019); improving - see objective exam (12/29/2019); improving but still lackas about 25% (01/26/2020);    Time 12    Period Weeks    Status Partially Met      PT LONG TERM GOAL #4   Title Patient will demonstrate equal or greater than 4+/5 MMT left ankle  strength to allow patient to ambulate with normal gait and complete work and functional activities that require weight bearing.    Baseline unable to test due to hypersensitivity and fear (12/09/2019); improving tolerance to isometric exercise (12/20/2019; 12/22/2019); improving - see objective exam (12/29/2019); improving see objective exam (01/27/2020);    Time 12    Period Weeks    Status Partially Met      PT LONG TERM GOAL #5   Title Complete community, work and/or recreational activities without limitation due to current condition.    Baseline difficulty with all weight bearing activities including household and community ambulation, going to [employer], stairs, visiting friends, social activities, playing for and caring for children, standing, working, bowling; currently not working (12/09/2019); continues to have similar limitations but is progressing appropriately at this point (12/20/2019; 12/22/2019); weaining out of boot - continues to have similar restrictions at this point (12/29/2019); improving but still limited in weight bearing activities (01/26/2020);    Time 12    Period Weeks    Status On-going                 Plan - 01/31/20 1544    Clinical Impression Statement Patient tolerated treatment well overall but continues to report pain with exercise and perform exercise with decreased velocity. Able to tolerate progression to red theraband for ankle 4 way exercises. Patient would benefit from continued management of limiting condition by skilled physical therapist to address remaining impairments and functional limitations to work towards stated goals and return to PLOF or maximal functional independence    Personal Factors and Comorbidities Age;Behavior Pattern;Comorbidity 3+    Comorbidities Relevant past medical history and comorbidities include severe uterine prolapse (sx planned soon, no lifting over 15 lbs), hernia repair in the last year, current smoker, cholecystectomy, GERD,  headache, neck pain, asthma .    Examination-Activity Limitations Bed Mobility;Bathing;Lift;Stand;Locomotion Level;Toileting;Bend;Transfers;Caring for Others;Carry;Sit;Dressing;Squat;Hygiene/Grooming;Stairs    Examination-Participation Restrictions Yard Work;Cleaning;Meal Prep;Community Activity;Driving;Interpersonal Relationship;Laundry;Volunteer;Other   work, Environmental consultant, social participation, playing with and caring for children   Stability/Clinical Decision Making Evolving/Moderate complexity    Rehab Potential Good    PT Frequency 2x / week    PT Duration 12 weeks    PT Treatment/Interventions ADLs/Self Care Home Management;Aquatic Therapy;Biofeedback;Cryotherapy;Electrical Stimulation;Moist Heat;DME Instruction;Gait training;Stair training;Functional mobility training;Therapeutic activities;Therapeutic exercise;Balance training;Neuromuscular re-education;Patient/family education;Orthotic Fit/Training;Manual techniques;Compression bandaging;Scar mobilization;Passive range of motion;Dry needling;Splinting;Visual/perceptual remediation/compensation;Spinal Manipulations;Taping;Joint Manipulations    PT Next Visit Plan intrinsic foot exercises, ROM,  strengthening as tolerated and within known surgeon's precautions    PT Home Exercise Plan Medbridge Access Code: [email or web address]    Consulted and Agree with Plan of Care Patient           Patient will benefit from skilled therapeutic intervention in order to improve the following deficits and impairments:  Abnormal gait, Decreased skin integrity, Decreased knowledge of use of DME, Increased fascial restricitons, Pain, Improper body mechanics, Decreased scar mobility, Decreased mobility, Decreased coordination, Decreased activity tolerance, Decreased endurance, Decreased range of motion, Decreased strength, Hypomobility, Impaired perceived functional ability, Impaired flexibility, Increased edema, Difficulty walking, Decreased balance  Visit Diagnosis: Pain in  left ankle and joints of left foot  Stiffness of left ankle, not elsewhere classified  Muscle weakness (generalized)  Difficulty in walking, not elsewhere classified     Problem List Patient Active Problem List   Diagnosis Date Noted  . Incisional hernia, without obstruction or gangrene 02/21/2019  . Rectus diastasis 02/21/2019  . Ectopic pregnancy 06/20/2017  . Post-op pain 06/20/2017  . Cesarean delivery, delivered, current hospitalization 08/12/2016  . Term birth of newborn female 02/13/2011    Everlean Alstrom. Graylon Good, PT, DPT 01/31/20, 7:07 PM  [facility] [address] [place], [address] Phone: [phone]   Fax:  [phone]  Name: ADORA YEH MRN: [id number] Date of Birth: [birth year]

## 2020-02-01 ENCOUNTER — Encounter: Payer: Self-pay | Admitting: *Deleted

## 2020-02-01 ENCOUNTER — Other Ambulatory Visit: Payer: Self-pay

## 2020-02-01 ENCOUNTER — Ambulatory Visit (INDEPENDENT_AMBULATORY_CARE_PROVIDER_SITE_OTHER): Payer: Medicaid Other | Admitting: Podiatry

## 2020-02-01 DIAGNOSIS — M722 Plantar fascial fibromatosis: Secondary | ICD-10-CM | POA: Diagnosis not present

## 2020-02-01 DIAGNOSIS — Z9889 Other specified postprocedural states: Secondary | ICD-10-CM

## 2020-02-01 DIAGNOSIS — M898X7 Other specified disorders of bone, ankle and foot: Secondary | ICD-10-CM

## 2020-02-01 NOTE — Progress Notes (Signed)
   Subjective:  Patient presents today status post permanent partial nail avulsion of the lateral borders of the bilateral great toes; EPF left; tarsal exostectomy left. DOS: 10/07/2019. Patient has been going to physical therapy and she states that although it does hurt she is getting improvement. No new complaints at this time  Past Medical History:  Diagnosis Date  . Asthma   . Chlamydia   . GERD (gastroesophageal reflux disease)   . Gestational diabetes    with pregnancy x2  . Headache(784.0)   . Pregnancy induced hypertension    post- partem 2012  . Sickle cell trait ([facility])   . Trichomonas   . Umbilical hernia   . Urinary tract infection       Objective: Physical Exam General: The patient is alert and oriented x3 in no acute distress.  Dermatology: Skin is cool, dry and supple bilateral lower extremities. Negative for open lesions or macerations.  Vascular: Palpable pedal pulses bilaterally. There continues to be some minimal edema to the left foot compared to the contralateral limb. Capillary refill within normal limits.  Neurological: Epicritic and protective threshold grossly intact bilaterally.   Musculoskeletal Exam: There is some loss of the ability of plantar flexion of the digits 1-5 of the left foot. The tarsal exostectomy was performed along the neurovascular bundle and I do suspect that there is some nerve irritation in the area at the time of surgery causing the limited ability plantar flexion of the toes 1-5. Patient has paresthesia along the plantar surface of the foot as well.    Assessment: 1. s/p permanent partial nail avulsion of the lateral borders of the bilateral great toes; EPF left; tarsal exostectomy left. DOS: 10/07/2019.    Plan of Care:  1. Patient was evaluated.  2.  Continue physical therapy with Roseanne Reno PT 3. Continue wearing good supportive sneakers 4. Note for work was provided today. Half shift only x1 month. 5. Return to clinic in 1  month  *Works at [employer].  Explained that we will continue to keep the patient out of work and reevaluate monthly  Felecia Shelling, DPM [facility]  Dr. Felecia Shelling, DPM    [address]                                        [place], [place] [address]                Office [phone]  Fax [phone]

## 2020-02-02 ENCOUNTER — Ambulatory Visit: Payer: Medicaid Other | Admitting: Physical Therapy

## 2020-02-07 ENCOUNTER — Telehealth: Payer: Self-pay | Admitting: Physical Therapy

## 2020-02-07 ENCOUNTER — Ambulatory Visit: Payer: Medicaid Other | Admitting: Physical Therapy

## 2020-02-07 NOTE — Telephone Encounter (Signed)
Called pt after she did not show up for her appt. at 2:30pm today. No answer. Left VM requesting she call back to reschedule. Call back number [phone]   Luretha Murphy. Ilsa Iha, PT, DPT 02/07/20, 3:00 PM

## 2020-02-09 ENCOUNTER — Ambulatory Visit: Payer: Medicaid Other | Admitting: Physical Therapy

## 2020-02-16 ENCOUNTER — Encounter: Payer: Medicaid Other | Admitting: Physical Therapy

## 2020-02-21 ENCOUNTER — Ambulatory Visit: Payer: Medicaid Other | Admitting: Physical Therapy

## 2020-02-21 ENCOUNTER — Telehealth: Payer: Self-pay | Admitting: Physical Therapy

## 2020-02-21 NOTE — Telephone Encounter (Signed)
Called patient when she did not come to her appointment scheduled today for 2:30pm. Patient answered and said she had called and spoken to someone at our clinic twice about her appointments. She states her son was exposed to COVID19, so they had to [place] and the person who watches her son is unavailable now, so she cannot come due to no childcare. I informed her of our no-show policy and that it is possible the staff she spoke to did not realize she wanted to cancel more than one appointment. Patient reports she has no childcare and cannot come at any time of day at the moment. Discussed her options and she decided to cancel her one future appointment currently and get back in touch with the the clinic if she is able to get childcare in the future. Explained we may need a new referral or insurance auth if it was a very long time but that those barriers could be overcome if she continues to need PT.   Luretha Murphy. Ilsa Iha, PT, DPT 02/21/20, 4:49 PM

## 2020-02-23 ENCOUNTER — Ambulatory Visit: Payer: Medicaid Other | Admitting: Physical Therapy

## 2020-02-24 DIAGNOSIS — M79676 Pain in unspecified toe(s): Secondary | ICD-10-CM

## 2020-03-03 ENCOUNTER — Encounter: Payer: Medicaid Other | Admitting: Podiatry

## 2020-03-07 ENCOUNTER — Ambulatory Visit (INDEPENDENT_AMBULATORY_CARE_PROVIDER_SITE_OTHER): Payer: Medicaid Other | Admitting: Podiatry

## 2020-03-07 ENCOUNTER — Other Ambulatory Visit: Payer: Self-pay

## 2020-03-07 DIAGNOSIS — M722 Plantar fascial fibromatosis: Secondary | ICD-10-CM | POA: Diagnosis not present

## 2020-03-07 DIAGNOSIS — Z9889 Other specified postprocedural states: Secondary | ICD-10-CM

## 2020-03-07 DIAGNOSIS — M898X7 Other specified disorders of bone, ankle and foot: Secondary | ICD-10-CM

## 2020-03-07 MED ORDER — MELOXICAM 15 MG PO TABS: 15.0000 mg | ORAL_TABLET | Freq: Every day | ORAL | 2 refills | Status: DC

## 2020-03-07 NOTE — Progress Notes (Signed)
   Subjective:  Patient presents today status post permanent partial nail avulsion of the lateral borders of the bilateral great toes; EPF left; tarsal exostectomy left. DOS: 10/07/2019.  Patient has discontinued physical therapy.  She does not have anyone to watch her 35-year-old son.  She continues to have intermittent pain throughout the day.  Otherwise there is improvement since prior to surgery.  She presents for further treatment and evaluation  Past Medical History:  Diagnosis Date  . Asthma   . Chlamydia   . GERD (gastroesophageal reflux disease)   . Gestational diabetes    with pregnancy x2  . Headache(784.0)   . Pregnancy induced hypertension    post- partem 2012  . Sickle cell trait ([facility])   . Trichomonas   . Umbilical hernia   . Urinary tract infection       Objective: Physical Exam General: The patient is alert and oriented x3 in no acute distress.  Dermatology: Skin is cool, dry and supple bilateral lower extremities. Negative for open lesions or macerations.  Vascular: Palpable pedal pulses bilaterally. There continues to be some minimal edema to the left foot compared to the contralateral limb. Capillary refill within normal limits.  Neurological: Epicritic and protective threshold grossly intact bilaterally.   Musculoskeletal Exam: There is some loss of the ability of plantar flexion of the digits 1-5 of the left foot. The tarsal exostectomy was performed along the neurovascular bundle and I do suspect that there is some nerve irritation in the area at the time of surgery causing the limited ability plantar flexion of the toes 1-5. Patient has paresthesia along the plantar surface of the foot as well.  There is also pain on palpation along the medial calcaneal tubercle left foot consistent with findings of a recurrent plantar fasciitis/chronic heel pain despite EPF surgery    Assessment: 1. s/p permanent partial nail avulsion of the lateral borders of the bilateral  great toes; EPF left; tarsal exostectomy left. DOS: 10/07/2019.  2.  Recurrent plantar fasciitis left   Plan of Care:  1. Patient was evaluated.  2.  Patient has discontinued physical therapy. 3.  Continue daily home exercises and strengthening exercises to the foot 4.  Continue wearing good supportive sneakers with compression daily 5.  Prescription for meloxicam 15 mg daily as needed 6.  I explained to the patient today I still believe there is a significant amount of healing that can be done in the area.  She is a proximately 5 months postoperatively now.  We will give it more time to see if there is an additional improvement with time. 7.  Return to clinic in 3 months  *Works at [employer].  Explained that we will continue to keep the patient out of work and reevaluate monthly.  Patient states that she is in no rush to return to work.  *Patient having hysterectomy 05/12/2020  Felecia Shelling, DPM [facility]  Dr. Felecia Shelling, DPM    [address]                                        [place], [place] [address]                Office [phone]  Fax [phone]

## 2020-03-28 ENCOUNTER — Encounter: Payer: Self-pay | Admitting: Physical Therapy

## 2020-03-28 DIAGNOSIS — M25672 Stiffness of left ankle, not elsewhere classified: Secondary | ICD-10-CM

## 2020-03-28 DIAGNOSIS — M25572 Pain in left ankle and joints of left foot: Secondary | ICD-10-CM

## 2020-03-28 DIAGNOSIS — M6281 Muscle weakness (generalized): Secondary | ICD-10-CM

## 2020-03-28 DIAGNOSIS — R262 Difficulty in walking, not elsewhere classified: Secondary | ICD-10-CM

## 2020-03-28 NOTE — Therapy (Signed)
[facility] [address] [place], [address] Phone: [phone]   Fax:  [phone]  Physical Therapy No-Visit Discharge Summary Reporting period: 12/09/2019 - 03/28/2020  Patient Details  Name: Christy Jones MRN: [id number] Date of Birth: 1986/04/25 Referring Provider (PT): Edrick Kins, [place]   Encounter Date: 03/28/2020    Past Medical History:  Diagnosis Date  . Asthma   . Chlamydia   . GERD (gastroesophageal reflux disease)   . Gestational diabetes    with pregnancy x2  . Headache(784.0)   . Pregnancy induced hypertension    post- partem 2012  . Sickle cell trait ([facility])   . Trichomonas   . Umbilical hernia   . Urinary tract infection     Past Surgical History:  Procedure Laterality Date  . CESAREAN SECTION N/A 08/11/2016   Procedure: CESAREAN SECTION;  Surgeon: Vanessa Kick, MD;  Location: [facility];  Service: Obstetrics;  Laterality: N/A;  . CHOLECYSTECTOMY N/A 07/30/2012   Procedure: LAPAROSCOPIC CHOLECYSTECTOMY WITH INTRAOPERATIVE CHOLANGIOGRAM;  Surgeon: Adin Hector, MD;  Location: [facility];  Service: General;  Laterality: N/A;  . DIAGNOSTIC LAPAROSCOPY WITH REMOVAL OF ECTOPIC PREGNANCY N/A 06/20/2017   Procedure: DIAGNOSTIC LAPAROSCOPY WITH REMOVAL OF ECTOPIC PREGNANCY;  Surgeon: Lavonia Drafts, MD;  Location: [facility];  Service: Gynecology;  Laterality: N/A;  . EXPLORATORY LAPAROTOMY     removal of ectopic pregnancy  . INCISIONAL HERNIA REPAIR N/A 02/25/2019   Procedure: OPEN INCISIONAL HERNIA REPAIR WITH MESH PATCH;  Surgeon: Armandina Gemma, MD;  Location: [facility];  Service: General;  Laterality: N/A;    There were no vitals filed for this visit.   Subjective Assessment - 03/28/20 1453    Subjective Patient stopped attending PT due to difficulty getting childcare. She did not return to PT and her certification period has expired so she will now be  discharged.    Pertinent History Patient is a 34 y.o. female who presents to outpatient physical therapy with a referral for medical diagnosis left plantar fasciitis, s/p left foot surgery (endoscopic plantar fasciotomy and tarsal exostectomy 10/07/19). This patient's chief complaints consist of left foot and ankle pain, stiffness, poor weight bearing tolerance leading to the following functional deficits: difficulty with all weight bearing activities including household and community ambulation, going to [employer], stairs, visiting friends, social activities, playing for and caring for children, standing, working, Environmental consultant.. Relevant past medical history and comorbidities include severe uterine prolapse (sx planned soon, no lifting over 15 lbs), hernia repair in the last year, current smoker, cholecystectomy, GERD, headache, neck pain, asthma .  Patient denies hx of cancer, stroke, seizures, lung problem, major cardiac events, diabetes, unexplained weight loss, changes in bowel or bladder problems.    Limitations Standing;Walking;House hold activities;Other (comment)   work, anything that requires weight bearing, bowling   Diagnostic tests Radiograph 10/15/19: "Osteotomies sites appear to be stable with routine healing"    Patient Stated Goals to get better           OBJECTIVE Patient is not present for examination at this time. Please see previous documentation for latest objective data.      PT Short Term Goals - 12/21/19 1809      PT SHORT TERM GOAL #1   Title Be independent with initial home exercise program for self-management of symptoms.    Baseline Initial HEP provided at IE (12/09/2019);    Time 2    Period Weeks  Status Achieved    Target Date 12/23/19             PT Long Term Goals - 03/28/20 1454      PT LONG TERM GOAL #1   Title Be independent with a long-term home exercise program for self-management of symptoms.    Baseline Initial HEP provided at IE (12/09/2019);  currently participating in recovery appropriate HEP (12/20/2019; 12/22/2019; 12/29/2019); partially participating (01/26/2020);    Time 12    Period Weeks    Status Partially Met   TARGET DATE FOR ALL LONG TERM GOALS: 03/02/2020     PT LONG TERM GOAL #2   Title Demonstrate improved FOTO score to 67 or greater to demonstrate improvement in overall condition and self-reported functional ability.    Baseline 54 (12/09/2019); too early to be appropriate for re-measure for this condition (12/20/2019); did not capture (01/26/2020);    Time 12    Period Weeks    Status Unable to assess      PT LONG TERM GOAL #3   Title Patient will demonstrate L ankle PROM equal or greater than R to allow successfull return to ambulatory actiivties without boot or assistive device.    Baseline very limited compared to right (12/09/2019); improving per observation (12/20/2019; 12/22/2019); improving - see objective exam (12/29/2019); improving but still lackas about 25% (01/26/2020);    Time 12    Period Weeks    Status Partially Met      PT LONG TERM GOAL #4   Title Patient will demonstrate equal or greater than 4+/5 MMT left ankle strength to allow patient to ambulate with normal gait and complete work and functional activities that require weight bearing.    Baseline unable to test due to hypersensitivity and fear (12/09/2019); improving tolerance to isometric exercise (12/20/2019; 12/22/2019); improving - see objective exam (12/29/2019); improving see objective exam (01/27/2020);    Time 12    Period Weeks    Status Partially Met      PT LONG TERM GOAL #5   Title Complete community, work and/or recreational activities without limitation due to current condition.    Baseline difficulty with all weight bearing activities including household and community ambulation, going to [employer], stairs, visiting friends, social activities, playing for and caring for children, standing, working, bowling; currently not working (12/09/2019);  continues to have similar limitations but is progressing appropriately at this point (12/20/2019; 12/22/2019); weaining out of boot - continues to have similar restrictions at this point (12/29/2019); improving but still limited in weight bearing activities (01/26/2020);    Time 12    Period Weeks    Status Not Met                 Plan - 03/28/20 1456    Clinical Impression Statement Patient attended 11 physical therapy sessions and was making gradual progress towards goals but did have difficulty with ongoing pain. Stopped coming to PT due to lack of childcare and did not return. Now discharged due to lack of attendance and expiration of certification period. Patient would benefit from continued management of limiting condition by skilled physical therapist to address remaining impairments and functional limitations to work towards stated goals and return to PLOF or maximal functional independence.    Personal Factors and Comorbidities Age;Behavior Pattern;Comorbidity 3+    Comorbidities Relevant past medical history and comorbidities include severe uterine prolapse (sx planned soon, no lifting over 15 lbs), hernia repair in the last year, current smoker, cholecystectomy,  GERD, headache, neck pain, asthma .    Examination-Activity Limitations Bed Mobility;Bathing;Lift;Stand;Locomotion Level;Toileting;Bend;Transfers;Caring for Others;Carry;Sit;Dressing;Squat;Hygiene/Grooming;Stairs    Examination-Participation Restrictions Yard Work;Cleaning;Meal Prep;Community Activity;Driving;Interpersonal Relationship;Laundry;Volunteer;Other   work, Environmental consultant, social participation, playing with and caring for children   Stability/Clinical Decision Making Evolving/Moderate complexity    Rehab Potential Good    PT Frequency 2x / week    PT Duration 12 weeks    PT Treatment/Interventions ADLs/Self Care Home Management;Aquatic Therapy;Biofeedback;Cryotherapy;Electrical Stimulation;Moist Heat;DME Instruction;Gait  training;Stair training;Functional mobility training;Therapeutic activities;Therapeutic exercise;Balance training;Neuromuscular re-education;Patient/family education;Orthotic Fit/Training;Manual techniques;Compression bandaging;Scar mobilization;Passive range of motion;Dry needling;Splinting;Visual/perceptual remediation/compensation;Spinal Manipulations;Taping;Joint Manipulations    PT Next Visit Plan Patient is now discharged from [facility] Access Code: [email or web address]    Consulted and Agree with Plan of Care Patient           Patient will benefit from skilled therapeutic intervention in order to improve the following deficits and impairments:  Abnormal gait, Decreased skin integrity, Decreased knowledge of use of DME, Increased fascial restricitons, Pain, Improper body mechanics, Decreased scar mobility, Decreased mobility, Decreased coordination, Decreased activity tolerance, Decreased endurance, Decreased range of motion, Decreased strength, Hypomobility, Impaired perceived functional ability, Impaired flexibility, Increased edema, Difficulty walking, Decreased balance  Visit Diagnosis: Pain in left ankle and joints of left foot  Stiffness of left ankle, not elsewhere classified  Muscle weakness (generalized)  Difficulty in walking, not elsewhere classified     Problem List Patient Active Problem List   Diagnosis Date Noted  . Incisional hernia, without obstruction or gangrene 02/21/2019  . Rectus diastasis 02/21/2019  . Ectopic pregnancy 06/20/2017  . Post-op pain 06/20/2017  . Cesarean delivery, delivered, current hospitalization 08/12/2016  . Term birth of newborn female 02/13/2011   Everlean Alstrom. Graylon Good, PT, DPT 03/28/20, 2:57 PM  [facility] [address] [place], [address] Phone: [phone]   Fax:  [phone]  Name: AYAN YANKEY MRN: [id number] Date of Birth:  Nov 08, 1985

## 2020-06-06 ENCOUNTER — Encounter: Payer: Medicaid Other | Admitting: Podiatry

## 2020-07-14 ENCOUNTER — Other Ambulatory Visit: Payer: Self-pay

## 2020-07-14 ENCOUNTER — Encounter: Payer: Self-pay | Admitting: Podiatry

## 2020-07-14 ENCOUNTER — Ambulatory Visit (INDEPENDENT_AMBULATORY_CARE_PROVIDER_SITE_OTHER): Payer: Medicaid Other | Admitting: Podiatry

## 2020-07-14 DIAGNOSIS — M722 Plantar fascial fibromatosis: Secondary | ICD-10-CM

## 2020-07-14 NOTE — Progress Notes (Signed)
   Subjective:  Patient presents today status post permanent partial nail avulsion of the lateral borders of the bilateral great toes; EPF left; tarsal exostectomy left. DOS: 10/07/2019.  Patient does have some overall improvement however she continues to have some very mild tenderness to the plantar heel.  Overall there is improvement and there are no new complaints at this time  Past Medical History:  Diagnosis Date  . Asthma   . Chlamydia   . GERD (gastroesophageal reflux disease)   . Gestational diabetes    with pregnancy x2  . Headache(784.0)   . Pregnancy induced hypertension    post- partem 2012  . Sickle cell trait ([facility])   . Trichomonas   . Umbilical hernia   . Urinary tract infection       Objective: Physical Exam General: The patient is alert and oriented x3 in no acute distress.  Dermatology: Skin is cool, dry and supple bilateral lower extremities. Negative for open lesions or macerations.  Vascular: Palpable pedal pulses bilaterally. There continues to be some minimal edema to the left foot compared to the contralateral limb. Capillary refill within normal limits.  Neurological: Epicritic and protective threshold grossly intact bilaterally.   Musculoskeletal Exam: Range of motion and muscle strength especially with flexion of the digits 1-5 of the left foot appear to be improved.  Of surgery causing the limited ability plantar flexion of the toes 1-5. Patient has paresthesia along the plantar surface of the foot as well.  There is also improved pain on palpation along the medial calcaneal tubercle left foot consistent with findings of a recurrent plantar fasciitis/chronic heel pain despite EPF surgery    Assessment: 1. s/p permanent partial nail avulsion of the lateral borders of the bilateral great toes; EPF left; tarsal exostectomy left. DOS: 10/07/2019.  2.  Recurrent plantar fasciitis left   Plan of Care:  1. Patient was evaluated.  2.  From a  podiatry/surgical standpoint the patient may resume full activity no restrictions.  Recommend good supportive shoes 3.  Recommend OTC power step insoles 4.  Return to clinic as needed  *Works at [employer].  Explained that we will continue to keep the patient out of work and reevaluate monthly.  Patient states that she is in no rush to return to work.  *Patient had hysterectomy 05/12/2020  Felecia Shelling, DPM [facility]  Dr. Felecia Shelling, DPM    [address]                                        [place], [place] [address]                Office [phone]  Fax [phone]

## 2021-01-04 ENCOUNTER — Ambulatory Visit
Admission: RE | Admit: 2021-01-04 | Discharge: 2021-01-04 | Disposition: A | Discharge status: Home / Self Care | Payer: Medicaid Other | Source: Ambulatory Visit | Attending: Obstetrics and Gynecology | Admitting: Obstetrics and Gynecology

## 2021-01-04 ENCOUNTER — Other Ambulatory Visit: Payer: Self-pay

## 2021-01-04 VITALS — BP 145/90 | HR 76 | Temp 99.0°F | Resp 18

## 2021-01-04 DIAGNOSIS — L720 Epidermal cyst: Secondary | ICD-10-CM | POA: Diagnosis not present

## 2021-01-04 DIAGNOSIS — L700 Acne vulgaris: Secondary | ICD-10-CM

## 2021-01-04 NOTE — Discharge Instructions (Addendum)
Schedule an appointment with a dermatologist as soon as possible.

## 2021-01-04 NOTE — ED Triage Notes (Signed)
Pt presents for abscess on back just medial to L shoulder blade x 4 years.  Feels some pressure when sits back on it.  Reports breaking out all over her back - unsure if it is eczema or related to initial bump.

## 2021-01-04 NOTE — ED Provider Notes (Signed)
[facility]    CSN: [id number] Arrival date & time: 01/04/21  1100      History   Chief Complaint Chief Complaint  Patient presents with   Abscess     HPI Christy Jones is a 35 y.o. female.  Patient presents with a "lump" on her left upper back x4 years.  She also reports some other bumps on her back in the last couple of days.  They are occasionally mildly pruritic; nontender.  She denies open wounds or drainage.  She denies fever, chills, or other symptoms.  No treatments attempted at home.  Her medical history includes asthma and GERD.  The history is provided by the patient and medical records.   Past Medical History:  Diagnosis Date   Asthma    Chlamydia    GERD (gastroesophageal reflux disease)    Gestational diabetes    with pregnancy x2   Headache(784.0)    Pregnancy induced hypertension    post- partem 2012   Sickle cell trait ([facility])    Trichomonas    Umbilical hernia    Urinary tract infection     Patient Active Problem List   Diagnosis Date Noted   Incisional hernia, without obstruction or gangrene 02/21/2019   Rectus diastasis 02/21/2019   Ectopic pregnancy 06/20/2017   Post-op pain 06/20/2017   Cesarean delivery, delivered, current hospitalization 08/12/2016   Term birth of newborn female 02/13/2011    Past Surgical History:  Procedure Laterality Date   ABDOMINAL HYSTERECTOMY     CESAREAN SECTION N/A 08/11/2016   Procedure: CESAREAN SECTION;  Surgeon: Waynard Reeds, MD;  Location: [facility];  Service: Obstetrics;  Laterality: N/A;   CHOLECYSTECTOMY N/A 07/30/2012   Procedure: LAPAROSCOPIC CHOLECYSTECTOMY WITH INTRAOPERATIVE CHOLANGIOGRAM;  Surgeon: Ernestene Mention, MD;  Location: [facility];  Service: General;  Laterality: N/A;   DIAGNOSTIC LAPAROSCOPY WITH REMOVAL OF ECTOPIC PREGNANCY N/A 06/20/2017   Procedure: DIAGNOSTIC LAPAROSCOPY WITH REMOVAL OF ECTOPIC PREGNANCY;  Surgeon: Willodean Rosenthal, MD;  Location: [facility];   Service: Gynecology;  Laterality: N/A;   EXPLORATORY LAPAROTOMY     removal of ectopic pregnancy   FOOT SURGERY  2021   INCISIONAL HERNIA REPAIR N/A 02/25/2019   Procedure: OPEN INCISIONAL HERNIA REPAIR WITH MESH PATCH;  Surgeon: Darnell Level, MD;  Location: [facility];  Service: General;  Laterality: N/A;    OB History     Gravida  7   Para  3   Term  3   Preterm  0   AB  3   Living  2      SAB  1   IAB  1   Ectopic  1   Multiple  0   Live Births  1            Home Medications    Prior to Admission medications   Medication Sig Start Date End Date Taking? Authorizing Provider  acetaminophen (TYLENOL) 500 MG tablet Take 1,000 mg by mouth every 6 (six) hours as needed for moderate pain.   Yes [provider]  IBUPROFEN PO Take by mouth.   Yes [provider]  meloxicam (MOBIC) 15 MG tablet Take 1 tablet (15 mg total) by mouth daily. 03/07/20  Yes Felecia Shelling, DPM  oxyCODONE-acetaminophen (PERCOCET) 5-325 MG tablet Take 1 tablet by mouth every 6 (six) hours as needed for severe pain. 10/26/19   Felecia Shelling, DPM  predniSONE (DELTASONE) 10 MG tablet Take 6 tablets  today, on day 2 take 5 tablets, day 3 take 4 tablets, day 4 take 3 tablets, day 5 take  2 tablets and 1 tablet the last day Patient not taking: Reported on 12/09/2019 05/31/19   Tommi Rumps, PA-C  terbinafine (LAMISIL) 250 MG tablet Take 1 tablet (250 mg total) by mouth daily. Patient not taking: No sig reported 01/26/19   Felecia Shelling, DPM  traMADol (ULTRAM) 50 MG tablet Take 1 tablet (50 mg total) by mouth every 6 (six) hours as needed. Patient not taking: No sig reported 05/31/19   Tommi Rumps, PA-C    Family History Family History  Problem Relation Age of Onset   Asthma Maternal Grandmother    Diabetes Maternal Grandmother    Anesthesia problems Neg Hx     Social History Social History   Tobacco Use   Smoking status: Every Day    Packs/day:  0.25    Years: 10.00    Pack years: 2.50    Types: Cigarettes   Smokeless tobacco: Never  Vaping Use   Vaping Use: Never used  Substance Use Topics   Alcohol use: Not Currently   Drug use: No     Allergies   Lactose intolerance (gi)   Review of Systems Review of Systems  Constitutional:  Negative for chills and fever.  Respiratory:  Negative for cough and shortness of breath.   Cardiovascular:  Negative for chest pain and palpitations.  Skin:  Positive for rash. Negative for color change.  All other systems reviewed and are negative.   Physical Exam Triage Vital Signs ED Triage Vitals  Enc Vitals Group     BP      Pulse      Resp      Temp      Temp src      SpO2      Weight      Height      Head Circumference      Peak Flow      Pain Score      Pain Loc      Pain Edu?      Excl. in GC?    No data found.  Updated Vital Signs BP (!) 145/90 (BP Location: Left Arm)   Pulse 76   Temp 99 F (37.2 C) (Oral)   Resp 18   LMP 05/18/2019 (Approximate)   SpO2 95%   Visual Acuity Right Eye Distance:   Left Eye Distance:   Bilateral Distance:    Right Eye Near:   Left Eye Near:    Bilateral Near:     Physical Exam Vitals and nursing note reviewed.  Constitutional:      General: She is not in acute distress.    Appearance: She is well-developed.  HENT:     Head: Normocephalic and atraumatic.     Mouth/Throat:     Mouth: Mucous membranes are moist.  Eyes:     Conjunctiva/sclera: Conjunctivae normal.  Cardiovascular:     Rate and Rhythm: Normal rate and regular rhythm.     Heart sounds: Normal heart sounds.  Pulmonary:     Effort: Pulmonary effort is normal. No respiratory distress.     Breath sounds: Normal breath sounds.  Abdominal:     Palpations: Abdomen is soft.     Tenderness: There is no abdominal tenderness.  Musculoskeletal:     Cervical back: Neck supple.  Skin:    General: Skin is warm and dry.  Findings: Lesion and rash present.      Comments: 2 x 2 cm flesh-colored area of induration on left upper back; no open wound or drainage.  No erythema.  Scattered acne on back.  Neurological:     General: No focal deficit present.     Mental Status: She is alert and oriented to person, place, and time.     Gait: Gait normal.  Psychiatric:        Mood and Affect: Mood normal.        Behavior: Behavior normal.     UC Treatments / Results  Labs (all labs ordered are listed, but only abnormal results are displayed) Labs Reviewed - No data to display  EKG   Radiology No results found.  Procedures Procedures (including critical care time)  Medications Ordered in UC Medications - No data to display  Initial Impression / Assessment and Plan / UC Course  I have reviewed the triage vital signs and the nursing notes.  Pertinent labs & imaging results that were available during my care of the patient were reviewed by me and considered in my medical decision making (see chart for details).  Cyst on left upper back.  Acne vulgaris on back.  No indication of abscess or infection.  Education provided on epidermoid cysts.  Instructed patient to schedule an appointment with a dermatologist for evaluation of the cyst.  Discussed skin care and acne.  Patient agrees to plan of care.   Final Clinical Impressions(s) / UC Diagnoses   Final diagnoses:  Epidermoid cyst of skin of back  Acne vulgaris     Discharge Instructions      Schedule an appointment with a dermatologist as soon as possible.         ED Prescriptions   None    PDMP not reviewed this encounter.   Mickie Bail, NP 01/04/21 1143

## 2021-01-12 ENCOUNTER — Other Ambulatory Visit: Payer: Self-pay

## 2021-01-12 ENCOUNTER — Ambulatory Visit: Payer: Medicaid Other | Admitting: Podiatry

## 2021-01-12 ENCOUNTER — Ambulatory Visit (INDEPENDENT_AMBULATORY_CARE_PROVIDER_SITE_OTHER): Payer: Medicaid Other

## 2021-01-12 DIAGNOSIS — M7751 Other enthesopathy of right foot: Secondary | ICD-10-CM

## 2021-01-12 DIAGNOSIS — M2011 Hallux valgus (acquired), right foot: Secondary | ICD-10-CM

## 2021-01-12 DIAGNOSIS — Z9889 Other specified postprocedural states: Secondary | ICD-10-CM

## 2021-01-12 DIAGNOSIS — M722 Plantar fascial fibromatosis: Secondary | ICD-10-CM | POA: Diagnosis not present

## 2021-01-15 DIAGNOSIS — M7751 Other enthesopathy of right foot: Secondary | ICD-10-CM | POA: Diagnosis not present

## 2021-01-15 DIAGNOSIS — M722 Plantar fascial fibromatosis: Secondary | ICD-10-CM | POA: Diagnosis not present

## 2021-01-15 MED ORDER — BETAMETHASONE SOD PHOS & ACET 6 (3-3) MG/ML IJ SUSP
3.0000 mg | Freq: Once | INTRAMUSCULAR | Status: AC
  Administered 2021-01-15: 3 mg via INTRA_ARTICULAR

## 2021-01-15 NOTE — Progress Notes (Signed)
   Subjective:  Patient presents today for new complaint regarding bilateral foot pain.  She has a history of permanent partial nail avulsion of the lateral borders of the bilateral great toes; EPF left; tarsal exostectomy left. DOS: 10/07/2019.    Patient states that gradually over the last several months she has developed bilateral foot pain.  She is concerned for possible bunion to the right foot.  She says it is very painful and symptomatic.  She also has been experiencing some heel pain to the left foot.  She presents for further treatment and evaluation.  She currently she has not done anything for treatment  Past Medical History:  Diagnosis Date   Asthma    Chlamydia    GERD (gastroesophageal reflux disease)    Gestational diabetes    with pregnancy x2   Headache(784.0)    Pregnancy induced hypertension    post- partem 2012   Sickle cell trait ([facility])    Trichomonas    Umbilical hernia    Urinary tract infection       Objective: Physical Exam General: The patient is alert and oriented x3 in no acute distress.  Dermatology: Skin is cool, dry and supple bilateral lower extremities. Negative for open lesions or macerations.  Vascular: Palpable pedal pulses bilaterally.  No edema or erythema noted  Neurological: Epicritic and protective threshold grossly intact bilaterally.   Musculoskeletal Exam: There is some pain on palpation overlying the plantar fascia left foot as well as pain on palpation range of motion to the first MTPJ of the right foot.  Mild hallux valgus deformity noted.  Radiographic exam: Mildly increased intermetatarsal angle noted to the right foot consistent with a mild hallux valgus deformity.  Slightly increased hallux abductus angle.   Assessment: 1. H/o  permanent partial nail avulsion of the lateral borders of the bilateral great toes; EPF left; tarsal exostectomy left. DOS: 10/07/2019.  2.  Recurrent plantar fasciitis left 3.  First MTPJ capsulitis  right   Plan of Care:  1. Patient was evaluated.  2.  Continue full activity no restrictions 3.  Injection of 0.5 cc Celestone Soluspan injected along the plantar fascial left in the first MTPJ of the right foot 4.  Recommend good supportive shoes and sneakers 5.  Return to clinic as needed  *Works at [employer].  Explained that we will continue to keep the patient out of work and reevaluate monthly.  Patient states that she is in no rush to return to work.    Felecia Shelling, DPM [facility]  Dr. Felecia Shelling, DPM    [address] [place], [place] [address]                Office [phone]  Fax [phone]

## 2021-01-22 ENCOUNTER — Ambulatory Visit: Payer: Medicaid Other | Admitting: Podiatry

## 2021-01-31 DIAGNOSIS — M79676 Pain in unspecified toe(s): Secondary | ICD-10-CM

## 2021-04-24 ENCOUNTER — Ambulatory Visit: Payer: Medicaid Other

## 2021-07-23 ENCOUNTER — Ambulatory Visit
Admission: RE | Admit: 2021-07-23 | Discharge: 2021-07-23 | Disposition: A | Discharge status: Home / Self Care | Payer: Medicaid Other | Source: Ambulatory Visit | Attending: Physician Assistant | Admitting: Physician Assistant

## 2021-07-23 ENCOUNTER — Other Ambulatory Visit: Payer: Self-pay

## 2021-07-23 VITALS — BP 129/91 | HR 76 | Temp 98.9°F | Resp 16

## 2021-07-23 DIAGNOSIS — J039 Acute tonsillitis, unspecified: Secondary | ICD-10-CM | POA: Insufficient documentation

## 2021-07-23 LAB — POCT MONO SCREEN (KUC): Mono, POC: NEGATIVE

## 2021-07-23 LAB — POCT RAPID STREP A (OFFICE): Rapid Strep A Screen: NEGATIVE

## 2021-07-23 MED ORDER — CLINDAMYCIN HCL 300 MG PO CAPS: 300.0000 mg | ORAL_CAPSULE | Freq: Four times a day (QID) | ORAL | 0 refills | Status: AC

## 2021-07-23 NOTE — Discharge Instructions (Addendum)
Return if any problems.

## 2021-07-23 NOTE — ED Triage Notes (Signed)
Pt here with very sore throat, tender and swollen lymph nodes, headache and chills x 3 days.

## 2021-07-25 LAB — CULTURE, GROUP A STREP (THRC)

## 2021-07-26 NOTE — ED Provider Notes (Signed)
Roderic Palau    CSN: [id number] Arrival date & time: 07/23/21  1049      History   Chief Complaint Chief Complaint  Patient presents with   Sore Throat   Headache   Chills    HPI Christy Jones is a 36 y.o. female.   The history is provided by the patient. No language interpreter was used.  Sore Throat This is a new problem. The current episode started more than 2 days ago. The problem occurs constantly. The problem has not changed since onset.Associated symptoms include headaches. Nothing aggravates the symptoms. Nothing relieves the symptoms. She has tried nothing for the symptoms. The treatment provided no relief.  Headache  Past Medical History:  Diagnosis Date   Asthma    Chlamydia    GERD (gastroesophageal reflux disease)    Gestational diabetes    with pregnancy x2   Headache(784.0)    Pregnancy induced hypertension    post- partem 2012   Sickle cell trait ([place])    Trichomonas    Umbilical hernia    Urinary tract infection     Patient Active Problem List   Diagnosis Date Noted   Incisional hernia, without obstruction or gangrene 02/21/2019   Rectus diastasis 02/21/2019   Ectopic pregnancy 06/20/2017   Post-op pain 06/20/2017   Cesarean delivery, delivered, current hospitalization 08/12/2016   Term birth of newborn female 02/13/2011    Past Surgical History:  Procedure Laterality Date   ABDOMINAL HYSTERECTOMY     CESAREAN SECTION N/A 08/11/2016   Procedure: CESAREAN SECTION;  Surgeon: Vanessa Kick, MD;  Location: [place];  Service: Obstetrics;  Laterality: N/A;   CHOLECYSTECTOMY N/A 07/30/2012   Procedure: LAPAROSCOPIC CHOLECYSTECTOMY WITH INTRAOPERATIVE CHOLANGIOGRAM;  Surgeon: Adin Hector, MD;  Location: [place];  Service: General;  Laterality: N/A;   DIAGNOSTIC LAPAROSCOPY WITH REMOVAL OF ECTOPIC PREGNANCY N/A 06/20/2017   Procedure: DIAGNOSTIC LAPAROSCOPY WITH REMOVAL OF ECTOPIC PREGNANCY;  Surgeon: Lavonia Drafts,  MD;  Location: [facility];  Service: Gynecology;  Laterality: N/A;   EXPLORATORY LAPAROTOMY     removal of ectopic pregnancy   FOOT SURGERY  2021   INCISIONAL HERNIA REPAIR N/A 02/25/2019   Procedure: OPEN INCISIONAL HERNIA REPAIR WITH MESH PATCH;  Surgeon: Armandina Gemma, MD;  Location: [facility];  Service: General;  Laterality: N/A;    OB History     Gravida  7   Para  3   Term  3   Preterm  0   AB  3   Living  2      SAB  1   IAB  1   Ectopic  1   Multiple  0   Live Births  1            Home Medications    Prior to Admission medications   Medication Sig Start Date End Date Taking? Authorizing Provider  clindamycin (CLEOCIN) 300 MG capsule Take 1 capsule (300 mg total) by mouth 4 (four) times daily for 10 days. 07/23/21 08/02/21 Yes Fransico Meadow, PA-C  acetaminophen (TYLENOL) 500 MG tablet Take 1,000 mg by mouth every 6 (six) hours as needed for moderate pain.    [provider]  IBUPROFEN PO Take by mouth.    [provider]  meloxicam (MOBIC) 15 MG tablet Take 1 tablet (15 mg total) by mouth daily. 03/07/20   Edrick Kins, DPM  oxyCODONE-acetaminophen (PERCOCET) 5-325 MG tablet Take 1 tablet by mouth every 6 (  six) hours as needed for severe pain. 10/26/19   Edrick Kins, DPM  predniSONE (DELTASONE) 10 MG tablet Take 6 tablets  today, on day 2 take 5 tablets, day 3 take 4 tablets, day 4 take 3 tablets, day 5 take  2 tablets and 1 tablet the last day Patient not taking: Reported on 12/09/2019 05/31/19   Johnn Hai, PA-C  terbinafine (LAMISIL) 250 MG tablet Take 1 tablet (250 mg total) by mouth daily. Patient not taking: No sig reported 01/26/19   Edrick Kins, DPM  traMADol (ULTRAM) 50 MG tablet Take 1 tablet (50 mg total) by mouth every 6 (six) hours as needed. Patient not taking: No sig reported 05/31/19   Johnn Hai, PA-C    Family History Family History  Problem Relation Age of Onset   Asthma Maternal  Grandmother    Diabetes Maternal Grandmother    Anesthesia problems Neg Hx     Social History Social History   Tobacco Use   Smoking status: Every Day    Packs/day: 0.25    Years: 10.00    Pack years: 2.50    Types: Cigarettes   Smokeless tobacco: Never  Vaping Use   Vaping Use: Never used  Substance Use Topics   Alcohol use: Not Currently   Drug use: No     Allergies   Lactose intolerance (gi)   Review of Systems Review of Systems  Neurological:  Positive for headaches.  All other systems reviewed and are negative.   Physical Exam Triage Vital Signs ED Triage Vitals  Enc Vitals Group     BP 07/23/21 1106 (!) 129/91     Pulse Rate 07/23/21 1106 76     Resp 07/23/21 1106 16     Temp 07/23/21 1106 98.9 F (37.2 C)     Temp Source 07/23/21 1106 Oral     SpO2 07/23/21 1106 97 %     Weight --      Height --      Head Circumference --      Peak Flow --      Pain Score 07/23/21 1121 7     Pain Loc --      Pain Edu? --      Excl. in [place]? --    No data found.  Updated Vital Signs BP (!) 129/91 (BP Location: Left Arm)    Pulse 76    Temp 98.9 F (37.2 C) (Oral)    Resp 16    LMP 05/18/2019 (Approximate)    SpO2 97%   Visual Acuity Right Eye Distance:   Left Eye Distance:   Bilateral Distance:    Right Eye Near:   Left Eye Near:    Bilateral Near:     Physical Exam Vitals and nursing note reviewed.  Constitutional:      Appearance: She is well-developed.  HENT:     Head: Normocephalic.     Mouth/Throat:     Mouth: Mucous membranes are moist.  Pulmonary:     Effort: Pulmonary effort is normal.  Abdominal:     General: There is no distension.  Musculoskeletal:        General: Normal range of motion.     Cervical back: Normal range of motion.  Skin:    General: Skin is warm.  Neurological:     Mental Status: She is alert and oriented to person, place, and time.  Psychiatric:        Mood and Affect: Mood  normal.     UC Treatments / Results   Labs (all labs ordered are listed, but only abnormal results are displayed) Labs Reviewed  POCT RAPID STREP A (OFFICE) - Normal  CULTURE, GROUP A STREP [facility])  POCT MONO SCREEN [facility])    EKG   Radiology No results found.  Procedures Procedures (including critical care time)  Medications Ordered in UC Medications - No data to display  Initial Impression / Assessment and Plan / UC Course  I have reviewed the triage vital signs and the nursing notes.  Pertinent labs & imaging results that were available during my care of the patient were reviewed by me and considered in my medical decision making (see chart for details).     MDM:  strep and mono are negative  Pt given clindamycin. Final Clinical Impressions(s) / UC Diagnoses   Final diagnoses:  Acute tonsillitis, unspecified etiology     Discharge Instructions      Return if any problems.   ED Prescriptions     Medication Sig Dispense Auth. Provider   clindamycin (CLEOCIN) 300 MG capsule Take 1 capsule (300 mg total) by mouth 4 (four) times daily for 10 days. 40 capsule Fransico Meadow, [place]      PDMP not reviewed this encounter.   Fransico Meadow, [place] 07/26/21 [phone]

## 2021-08-07 IMAGING — CR DG CERVICAL SPINE 2 OR 3 VIEWS
5 series · 5 of 5 positions shown · non-contrast
Comparison: None.

CLINICAL DATA: Left upper extremity radiculopathy.

EXAM:
CERVICAL SPINE - 2-3 VIEW

[c-spine lat]
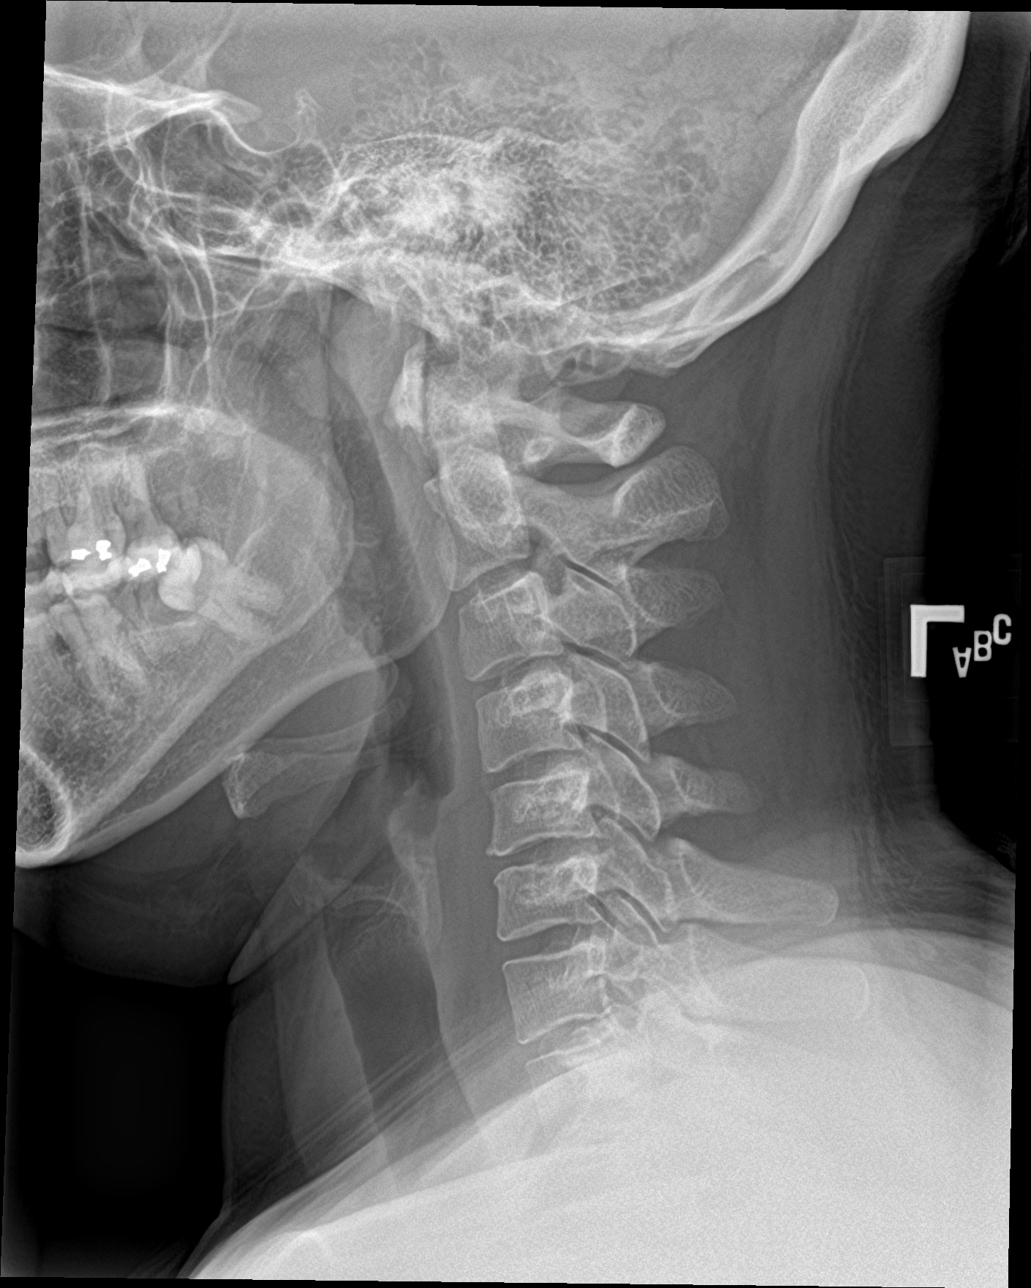

[c-spine ap]
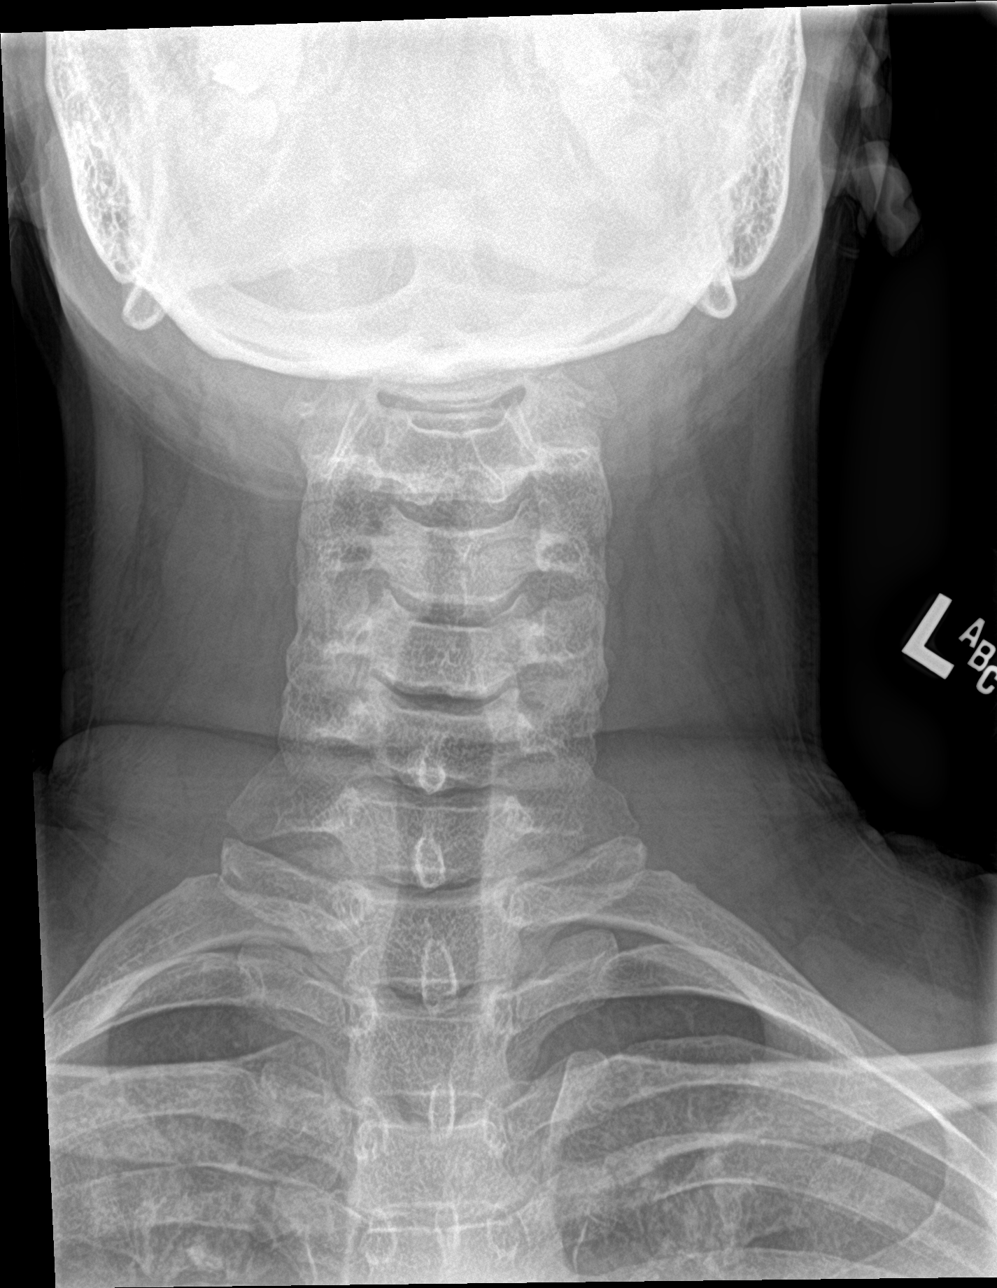

[c-spine open mouth (1 of 3)]
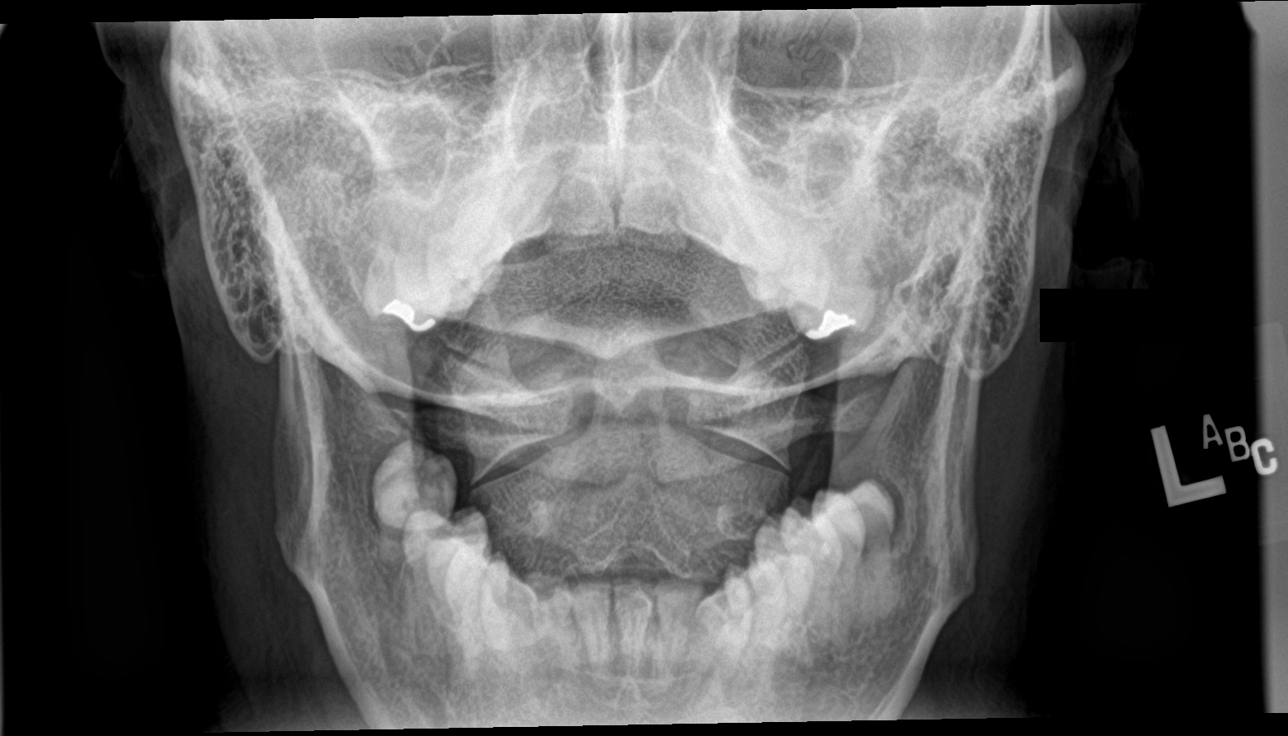

[c-spine open mouth (2 of 3)]
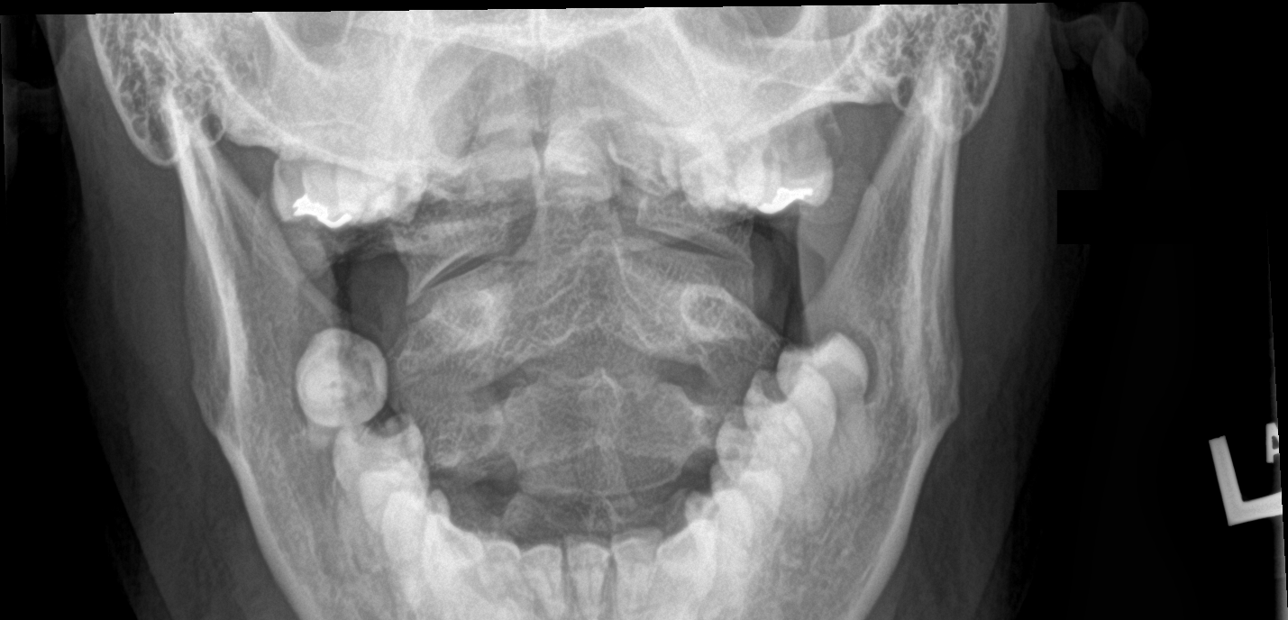

[c-spine open mouth (3 of 3)]
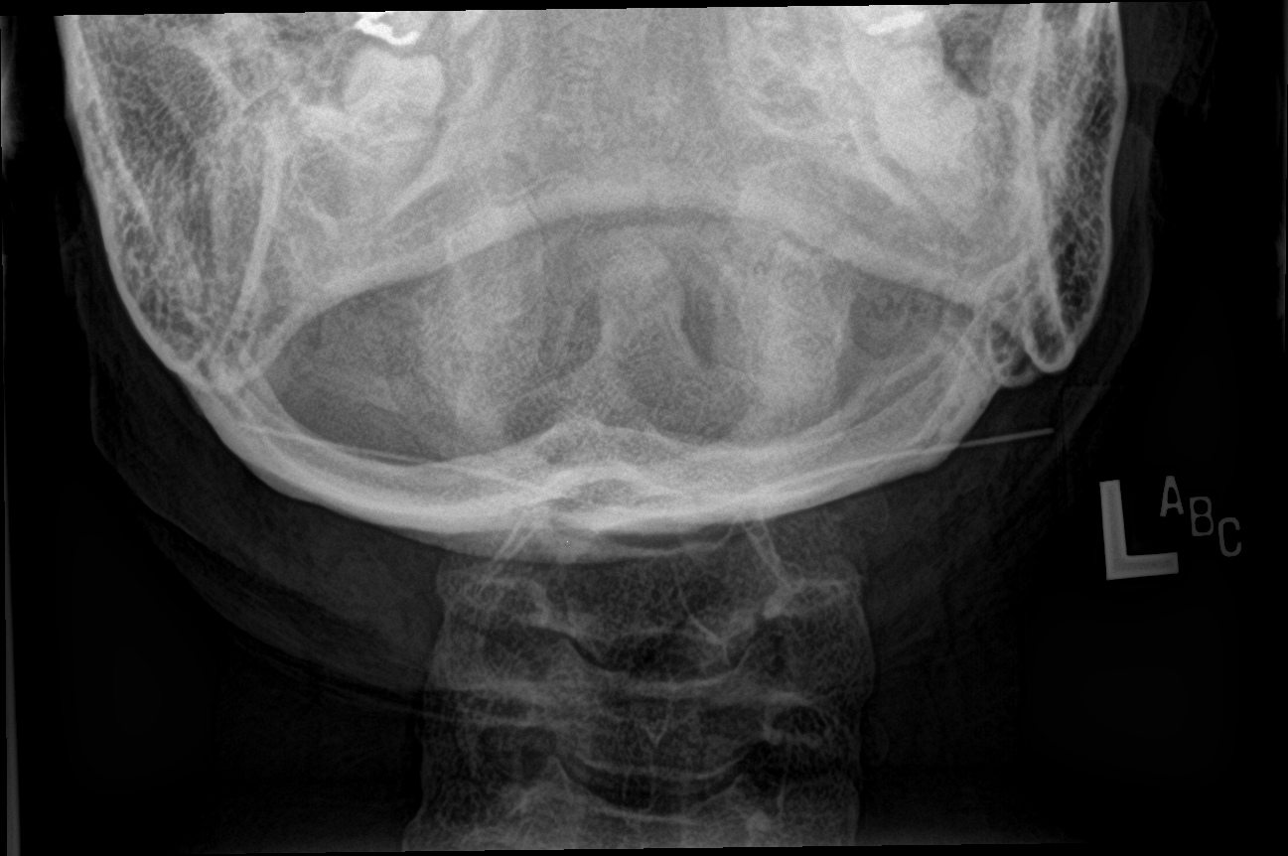

[5 of 5 positions shown; findings below may reference images not displayed]

FINDINGS: Mild kyphosis in the cervical spine. Vertebral body heights and disc
spaces are maintained. Mild degenerative endplate changes at C5-C6.
Prevertebral soft tissues are normal. Lung apices are clear.
Negative for a fracture.
IMPRESSION: 1. No acute abnormality in the cervical spine.
2. Mild degenerative changes at C5-C6.

## 2021-09-04 ENCOUNTER — Ambulatory Visit: Payer: Medicaid Other | Admitting: Podiatry

## 2021-09-04 ENCOUNTER — Encounter: Payer: Self-pay | Admitting: Podiatry

## 2021-09-04 ENCOUNTER — Encounter: Payer: Self-pay | Admitting: *Deleted

## 2021-09-04 ENCOUNTER — Other Ambulatory Visit: Payer: Self-pay

## 2021-09-04 DIAGNOSIS — M722 Plantar fascial fibromatosis: Secondary | ICD-10-CM

## 2021-09-04 MED ORDER — BETAMETHASONE SOD PHOS & ACET 6 (3-3) MG/ML IJ SUSP
3.0000 mg | Freq: Once | INTRAMUSCULAR | Status: AC
  Administered 2021-09-04: 3 mg via INTRA_ARTICULAR

## 2021-09-04 MED ORDER — METHYLPREDNISOLONE 4 MG PO TBPK: ORAL_TABLET | ORAL | 0 refills | Status: DC

## 2021-09-04 MED ORDER — MELOXICAM 15 MG PO TABS: 15.0000 mg | ORAL_TABLET | Freq: Every day | ORAL | 2 refills | Status: AC

## 2021-09-04 NOTE — Progress Notes (Signed)
? ?  Subjective: ?36 y.o. female presenting today for follow-up evaluation of bilateral heel pain.  Patient states that she has been working on her feet at [employer] and she continues to get burning heel pain.  She does have history of heel pain and EPF surgery to the left foot 10/07/2019.  She presents for further treatment and evaluation ? ? ?Past Medical History:  ?Diagnosis Date  ? Asthma   ? Chlamydia   ? GERD (gastroesophageal reflux disease)   ? Gestational diabetes   ? with pregnancy x2  ? Headache(784.0)   ? Pregnancy induced hypertension   ? post- partem 2012  ? Sickle cell trait ([facility])   ? Trichomonas   ? Umbilical hernia   ? Urinary tract infection   ? ?Past Surgical History:  ?Procedure Laterality Date  ? ABDOMINAL HYSTERECTOMY    ? CESAREAN SECTION N/A 08/11/2016  ? Procedure: CESAREAN SECTION;  Surgeon: Waynard Reeds, MD;  Location: [facility];  Service: Obstetrics;  Laterality: N/A;  ? CHOLECYSTECTOMY N/A 07/30/2012  ? Procedure: LAPAROSCOPIC CHOLECYSTECTOMY WITH INTRAOPERATIVE CHOLANGIOGRAM;  Surgeon: Ernestene Mention, MD;  Location: [facility];  Service: General;  Laterality: N/A;  ? DIAGNOSTIC LAPAROSCOPY WITH REMOVAL OF ECTOPIC PREGNANCY N/A 06/20/2017  ? Procedure: DIAGNOSTIC LAPAROSCOPY WITH REMOVAL OF ECTOPIC PREGNANCY;  Surgeon: Willodean Rosenthal, MD;  Location: [facility];  Service: Gynecology;  Laterality: N/A;  ? EXPLORATORY LAPAROTOMY    ? removal of ectopic pregnancy  ? FOOT SURGERY  2021  ? INCISIONAL HERNIA REPAIR N/A 02/25/2019  ? Procedure: OPEN INCISIONAL HERNIA REPAIR WITH MESH PATCH;  Surgeon: Darnell Level, MD;  Location: [facility];  Service: General;  Laterality: N/A;  ? ?Allergies  ?Allergen Reactions  ? Lactose Intolerance (Gi) Nausea And Vomiting  ? ? ? ?Objective: ?Physical Exam ?General: The patient is alert and oriented x3 in no acute distress. ? ?Dermatology: Skin is warm, dry and supple bilateral lower extremities. Negative for open lesions or  macerations bilateral.  ? ?Vascular: Dorsalis Pedis and Posterior Tibial pulses palpable bilateral.  Capillary fill time is immediate to all digits. ? ?Neurological: Epicritic and protective threshold intact bilateral.  ? ?Musculoskeletal: Tenderness to palpation to the plantar aspect of the bilateral heels along the plantar fascia. All other joints range of motion within normal limits bilateral. Strength 5/5 in all groups bilateral.  ? ? ?Assessment: ?1. plantar fasciitis bilateral feet ?2. PSxHx partial nail matricectomy b/l hallux LAT, EPF LT, tarsal exostectomy LT.  10/07/2019 ? ?Plan of Care:  ?1. Patient evaluated. Xrays reviewed.   ?2. Injection of 0.5cc Celestone soluspan injected into the bilateral heels.  ?3. Rx for Medrol Dose Pak placed ?4. Rx for Meloxicam ordered for patient. ?5.  OTC power step insoles provided at checkout ?6. Instructed patient regarding therapies and modalities at home to alleviate symptoms.  ?7.  Temporary handicap parking placard form completed today for the patient  ?8.  Return to clinic in 4 weeks.   ? ?Felecia Shelling, DPM ?[facility] ? ?Dr. Felecia Shelling, DPM  ?  ?[address]                                   ?[place], [place] [address]                ?Office [phone]  ?Fax [phone] ? ? ? ? ?

## 2021-11-13 ENCOUNTER — Ambulatory Visit
Admission: RE | Admit: 2021-11-13 | Discharge: 2021-11-13 | Disposition: A | Discharge status: Home / Self Care | Payer: Medicaid Other | Source: Ambulatory Visit | Attending: Family Medicine | Admitting: Family Medicine

## 2021-11-13 VITALS — BP 112/81 | HR 84 | Temp 98.1°F | Resp 16

## 2021-11-13 DIAGNOSIS — J0140 Acute pansinusitis, unspecified: Secondary | ICD-10-CM

## 2021-11-13 DIAGNOSIS — J209 Acute bronchitis, unspecified: Secondary | ICD-10-CM

## 2021-11-13 MED ORDER — DOXYCYCLINE HYCLATE 100 MG PO CAPS: 100.0000 mg | ORAL_CAPSULE | Freq: Two times a day (BID) | ORAL | 0 refills | Status: DC

## 2021-11-13 MED ORDER — PREDNISONE 20 MG PO TABS: 40.0000 mg | ORAL_TABLET | Freq: Every day | ORAL | 0 refills | Status: DC

## 2021-11-13 MED ORDER — ALBUTEROL SULFATE HFA 108 (90 BASE) MCG/ACT IN AERS: 2.0000 | INHALATION_SPRAY | Freq: Once | RESPIRATORY_TRACT | Status: DC

## 2021-11-13 NOTE — ED Triage Notes (Signed)
Pt presents with cough, runny nose and fever x 1 week.

## 2021-11-13 NOTE — ED Provider Notes (Signed)
Roderic Palau    CSN: [id number] Arrival date & time: 11/13/21  1601      History   Chief Complaint Chief Complaint  Patient presents with   Cough    RUNNY NOSE AND COUGH SINCE THURSDAY Nov 08, 2021. I DID MTAKE A FEW PICTURES OF WHAT CAME OUT TO HAVE A BETTER UNDERSTANDING. - Entered by patient    HPI SHAINDEL COULSON is a 36 y.o. female.   HPI Patient presents today with a 1 week history of cough, nasal congestion with runny nose, and low-grade fever.She became concerned as she coughed up sputum mixed with blood and mucus.  She also complains of mild shortness of breath and she has heard audible wheezing.  Patient is a current daily smoker. Patient has a history of asthma. No known sick contacts. She has attempted relief with OTC medication.  Past Medical History:  Diagnosis Date   Asthma    Chlamydia    GERD (gastroesophageal reflux disease)    Gestational diabetes    with pregnancy x2   Headache(784.0)    Pregnancy induced hypertension    post- partem 2012   Sickle cell trait ([place])    Trichomonas    Umbilical hernia    Urinary tract infection     Patient Active Problem List   Diagnosis Date Noted   Incisional hernia, without obstruction or gangrene 02/21/2019   Rectus diastasis 02/21/2019   Ectopic pregnancy 06/20/2017   Post-op pain 06/20/2017   Cesarean delivery, delivered, current hospitalization 08/12/2016   Term birth of newborn female 02/13/2011    Past Surgical History:  Procedure Laterality Date   ABDOMINAL HYSTERECTOMY     CESAREAN SECTION N/A 08/11/2016   Procedure: CESAREAN SECTION;  Surgeon: Vanessa Kick, MD;  Location: [place];  Service: Obstetrics;  Laterality: N/A;   CHOLECYSTECTOMY N/A 07/30/2012   Procedure: LAPAROSCOPIC CHOLECYSTECTOMY WITH INTRAOPERATIVE CHOLANGIOGRAM;  Surgeon: Adin Hector, MD;  Location: [place];  Service: General;  Laterality: N/A;   DIAGNOSTIC LAPAROSCOPY WITH REMOVAL OF ECTOPIC PREGNANCY N/A  06/20/2017   Procedure: DIAGNOSTIC LAPAROSCOPY WITH REMOVAL OF ECTOPIC PREGNANCY;  Surgeon: Lavonia Drafts, MD;  Location: [facility];  Service: Gynecology;  Laterality: N/A;   EXPLORATORY LAPAROTOMY     removal of ectopic pregnancy   FOOT SURGERY  2021   INCISIONAL HERNIA REPAIR N/A 02/25/2019   Procedure: OPEN INCISIONAL HERNIA REPAIR WITH MESH PATCH;  Surgeon: Armandina Gemma, MD;  Location: [place];  Service: General;  Laterality: N/A;    OB History     Gravida  7   Para  3   Term  3   Preterm  0   AB  3   Living  2      SAB  1   IAB  1   Ectopic  1   Multiple  0   Live Births  1            Home Medications    Prior to Admission medications   Medication Sig Start Date End Date Taking? Authorizing Provider  doxycycline (VIBRAMYCIN) 100 MG capsule Take 1 capsule (100 mg total) by mouth 2 (two) times daily. 11/13/21  Yes Scot Jun, FNP  meloxicam (MOBIC) 15 MG tablet Take 1 tablet (15 mg total) by mouth daily. 09/04/21  Yes Edrick Kins, DPM  predniSONE (DELTASONE) 20 MG tablet Take 2 tablets (40 mg total) by mouth daily with breakfast. 11/13/21  Yes Scot Jun, FNP  acetaminophen (TYLENOL) 500 MG tablet Take 1,000 mg by mouth every 6 (six) hours as needed for moderate pain.    [provider]  IBUPROFEN PO Take by mouth.    [provider]  methylPREDNISolone (MEDROL DOSEPAK) 4 MG TBPK tablet 6 day dose pack - take as directed 09/04/21   Edrick Kins, DPM  oxyCODONE-acetaminophen (PERCOCET) 5-325 MG tablet Take 1 tablet by mouth every 6 (six) hours as needed for severe pain. 10/26/19   Edrick Kins, DPM  terbinafine (LAMISIL) 250 MG tablet Take 1 tablet (250 mg total) by mouth daily. Patient not taking: No sig reported 01/26/19   Edrick Kins, DPM  traMADol (ULTRAM) 50 MG tablet Take 1 tablet (50 mg total) by mouth every 6 (six) hours as needed. Patient not taking: No sig reported 05/31/19   Johnn Hai, PA-C    Family History Family History  Problem Relation Age of Onset   Asthma Maternal Grandmother    Diabetes Maternal Grandmother    Anesthesia problems Neg Hx     Social History Social History   Tobacco Use   Smoking status: Every Day    Packs/day: 0.25    Years: 10.00    Pack years: 2.50    Types: Cigarettes   Smokeless tobacco: Never  Vaping Use   Vaping Use: Never used  Substance Use Topics   Alcohol use: Not Currently   Drug use: No     Allergies   Lactose intolerance (gi)   Review of Systems Review of Systems   Physical Exam Triage Vital Signs ED Triage Vitals [11/13/21 1626]  Enc Vitals Group     BP 112/81     Pulse Rate 84     Resp 16     Temp 98.1 F (36.7 C)     Temp Source Oral     SpO2 95 %     Weight      Height      Head Circumference      Peak Flow      Pain Score      Pain Loc      Pain Edu?      Excl. in [place]?    No data found.  Updated Vital Signs BP 112/81 (BP Location: Left Arm)   Pulse 84   Temp 98.1 F (36.7 C) (Oral)   Resp 16   LMP 05/18/2019 (Approximate)   SpO2 95%   Visual Acuity Right Eye Distance:   Left Eye Distance:   Bilateral Distance:    Right Eye Near:   Left Eye Near:    Bilateral Near:     Physical Exam Constitutional:      Appearance: Normal appearance.  HENT:     Head: Normocephalic and atraumatic.     Right Ear: Tympanic membrane, ear canal and external ear normal.     Left Ear: Tympanic membrane, ear canal and external ear normal.     Nose: Congestion and rhinorrhea present.  Cardiovascular:     Rate and Rhythm: Normal rate and regular rhythm.  Pulmonary:     Breath sounds: Wheezing and rhonchi present.  Musculoskeletal:     Cervical back: Normal range of motion and neck supple.  Lymphadenopathy:     Cervical: No cervical adenopathy.  Skin:    General: Skin is warm and dry.     Capillary Refill: Capillary refill takes less than 2 seconds.  Neurological:     General: No  focal deficit present.  Mental Status: She is alert and oriented to person, place, and time.  Psychiatric:        Mood and Affect: Mood normal.        Behavior: Behavior normal.     UC Treatments / Results  Labs (all labs ordered are listed, but only abnormal results are displayed) Labs Reviewed - No data to display  EKG   Radiology No results found.  Procedures Procedures (including critical care time)  Medications Ordered in UC Medications - No data to display  Initial Impression / Assessment and Plan / UC Course  I have reviewed the triage vital signs and the nursing notes.  Pertinent labs & imaging results that were available during my care of the patient were reviewed by me and considered in my medical decision making (see chart for details).    Acute Bronchitis and Acute Sinusitis  Doxycyline treatment of sinuitis  Prednisone for wheezing. Resume Albuterol inhaler 2 puffs every 4-6 hours as needed. Return if symptoms do not improve following completion of medication. Final Clinical Impressions(s) / UC Diagnoses   Final diagnoses:  Acute bronchitis, unspecified organism  Acute non-recurrent pansinusitis   Discharge Instructions   None    ED Prescriptions     Medication Sig Dispense Auth. Provider   predniSONE (DELTASONE) 20 MG tablet Take 2 tablets (40 mg total) by mouth daily with breakfast. 10 tablet Scot Jun, FNP   doxycycline (VIBRAMYCIN) 100 MG capsule Take 1 capsule (100 mg total) by mouth 2 (two) times daily. 20 capsule Scot Jun, FNP      PDMP not reviewed this encounter.   Scot Jun, [place] 11/16/21 1404

## 2022-06-04 ENCOUNTER — Ambulatory Visit
Admission: RE | Admit: 2022-06-04 | Discharge: 2022-06-04 | Disposition: A | Discharge status: Home / Self Care | Payer: Medicaid Other | Source: Ambulatory Visit | Attending: Emergency Medicine | Admitting: Emergency Medicine

## 2022-06-04 VITALS — BP 115/78 | HR 72 | Temp 98.9°F | Resp 19 | Ht 64.0 in | Wt 162.0 lb

## 2022-06-04 DIAGNOSIS — J45901 Unspecified asthma with (acute) exacerbation: Secondary | ICD-10-CM | POA: Insufficient documentation

## 2022-06-04 DIAGNOSIS — J069 Acute upper respiratory infection, unspecified: Secondary | ICD-10-CM | POA: Diagnosis not present

## 2022-06-04 DIAGNOSIS — R059 Cough, unspecified: Secondary | ICD-10-CM | POA: Diagnosis present

## 2022-06-04 DIAGNOSIS — Z20822 Contact with and (suspected) exposure to covid-19: Secondary | ICD-10-CM | POA: Insufficient documentation

## 2022-06-04 LAB — RESP PANEL BY RT-PCR (FLU A&B, COVID) ARPGX2
Influenza A by PCR: NEGATIVE
Influenza B by PCR: NEGATIVE
SARS Coronavirus 2 by RT PCR: NEGATIVE

## 2022-06-04 MED ORDER — PREDNISONE 10 MG PO TABS: 40.0000 mg | ORAL_TABLET | Freq: Every day | ORAL | 0 refills | Status: AC

## 2022-06-04 NOTE — ED Triage Notes (Addendum)
Patient to Urgent Care with complaints of cough, sweating/ cold chills. No known fevers. Nasal congestion. Reports cough originally was dry but is now productive.  Symptoms started Friday.   Has been taking tylenol cold/flu and zquil. Negative covid test at home.

## 2022-06-04 NOTE — Discharge Instructions (Addendum)
Take the prednisone as directed.  Use your albuterol inhaler as directed.    Your COVID and Flu tests are pending.    Take Tylenol or ibuprofen as needed for fever or discomfort.  Rest and keep yourself hydrated.    Follow-up with your primary care provider if your symptoms are not improving.     

## 2022-06-04 NOTE — ED Provider Notes (Signed)
Christy Jones    CSN: [id number] Arrival date & time: 06/04/22  0848      History   Chief Complaint Chief Complaint  Patient presents with   Cough    Been feeling bad since Friday....cough sweating cold chills - Entered by patient    HPI Christy Jones is a 36 y.o. female.  Patient presents with 4-day history of subjective fever, chills, congestion, cough.  She reports occasional wheezing but has not used her albuterol inhaler.  Treatment at home with OTC cold and flu medication.  She denies shortness of breath, chest pain, vomiting, diarrhea, rash, or other symptoms.  Negative COVID test at home.  Her medical history includes asthma.  As her albuterol inhaler is in date and she has plenty of activations left.  The history is provided by the patient and medical records.    Past Medical History:  Diagnosis Date   Asthma    Chlamydia    GERD (gastroesophageal reflux disease)    Gestational diabetes    with pregnancy x2   Headache(784.0)    Pregnancy induced hypertension    post- partem 2012   Sickle cell trait (HCC)    Trichomonas    Umbilical hernia    Urinary tract infection     Patient Active Problem List   Diagnosis Date Noted   Incisional hernia, without obstruction or gangrene 02/21/2019   Rectus diastasis 02/21/2019   Ectopic pregnancy 06/20/2017   Post-op pain 06/20/2017   Cesarean delivery, delivered, current hospitalization 08/12/2016   Term birth of newborn female 02/13/2011    Past Surgical History:  Procedure Laterality Date   ABDOMINAL HYSTERECTOMY     CESAREAN SECTION N/A 08/11/2016   Procedure: CESAREAN SECTION;  Surgeon: Waynard Reeds, MD;  Location: [facility] BIRTHING SU[facility];  Service: Obstetrics;  Laterality: N/A;   CHOLECYSTECTOMY N/A 07/30/2012   Procedure: LAPAROSCOPIC CHOLECYSTECTOMY WITH INTRAOPERATIVE CHOLANGIOGRAM;  Surgeon: Ernestene Mention, MD;  Location: [facility] OR;  Service: General;  Laterality: N/A;   DIAGNOSTIC LAPAROSCOPY WITH  REMOVAL OF ECTOPIC PREGNANCY N/A 06/20/2017   Procedure: DIAGNOSTIC LAPAROSCOPY WITH REMOVAL OF ECTOPIC PREGNANCY;  Surgeon: Willodean Rosenthal, MD;  Location: [facility] ORS;  Service: Gynecology;  Laterality: N/A;   EXPLORATORY LAPAROTOMY     removal of ectopic pregnancy   FOOT SURGERY  2021   INCISIONAL HERNIA REPAIR N/A 02/25/2019   Procedure: OPEN INCISIONAL HERNIA REPAIR WITH MESH PATCH;  Surgeon: Darnell Level, MD;  Location: [facility];  Service: General;  Laterality: N/A;    OB History     Gravida  7   Para  3   Term  3   Preterm  0   AB  3   Living  2      SAB  1   IAB  1   Ectopic  1   Multiple  0   Live Births  1            Home Medications    Prior to Admission medications   Medication Sig Start Date End Date Taking? Authorizing Provider  albuterol (PROAIR HFA) 108 (90 Base) MCG/ACT inhaler Inhale 2 puffs every 4 hours by inhalation route. 05/21/16  Yes [provider]  predniSONE (DELTASONE) 10 MG tablet Take 4 tablets (40 mg total) by mouth daily for 5 days. 06/04/22 06/09/22 Yes Mickie Bail, NP  acetaminophen (TYLENOL) 500 MG tablet Take 1,000 mg by mouth every 6 (six) hours as needed for moderate pain.  [provider]  IBUPROFEN PO Take by mouth.    [provider]  meloxicam (MOBIC) 15 MG tablet Take 1 tablet (15 mg total) by mouth daily. 09/04/21   Felecia Shelling, DPM  oxyCODONE-acetaminophen (PERCOCET) 5-325 MG tablet Take 1 tablet by mouth every 6 (six) hours as needed for severe pain. 10/26/19   Felecia Shelling, DPM  terbinafine (LAMISIL) 250 MG tablet Take 1 tablet (250 mg total) by mouth daily. Patient not taking: No sig reported 01/26/19   Felecia Shelling, DPM  traMADol (ULTRAM) 50 MG tablet Take 1 tablet (50 mg total) by mouth every 6 (six) hours as needed. Patient not taking: No sig reported 05/31/19   Tommi Rumps, PA-C    Family History Family History  Problem Relation Age of Onset    Asthma Maternal Grandmother    Diabetes Maternal Grandmother    Anesthesia problems Neg Hx     Social History Social History   Tobacco Use   Smoking status: Every Day    Packs/day: 0.25    Years: 10.00    Total pack years: 2.50    Types: Cigarettes   Smokeless tobacco: Never  Vaping Use   Vaping Use: Never used  Substance Use Topics   Alcohol use: Not Currently   Drug use: No     Allergies   Lactose intolerance (gi)   Review of Systems Review of Systems  Constitutional:  Positive for chills and fever.  HENT:  Positive for sore throat. Negative for ear pain.   Respiratory:  Positive for cough and wheezing. Negative for shortness of breath.   Cardiovascular:  Negative for chest pain and palpitations.  Gastrointestinal:  Negative for abdominal pain, diarrhea and vomiting.  Skin:  Negative for color change and rash.  All other systems reviewed and are negative.    Physical Exam Triage Vital Signs ED Triage Vitals  Enc Vitals Group     BP 06/04/22 0904 115/78     Pulse Rate 06/04/22 0904 72     Resp 06/04/22 0904 19     Temp 06/04/22 0904 98.9 F (37.2 C)     Temp src --      SpO2 06/04/22 0904 98 %     Weight 06/04/22 0901 162 lb (73.5 kg)     Height 06/04/22 0901 5\' 4"  (1.626 m)     Head Circumference --      Peak Flow --      Pain Score 06/04/22 0901 3     Pain Loc --      Pain Edu? --      Excl. in [place]? --    No data found.  Updated Vital Signs BP 115/78   Pulse 72   Temp 98.9 F (37.2 C)   Resp 19   Ht 5\' 4"  (1.626 m)   Wt 162 lb (73.5 kg)   LMP 05/18/2019 (Approximate)   SpO2 98%   BMI 27.81 kg/m   Visual Acuity Right Eye Distance:   Left Eye Distance:   Bilateral Distance:    Right Eye Near:   Left Eye Near:    Bilateral Near:     Physical Exam Vitals and nursing note reviewed.  Constitutional:      General: She is not in acute distress.    Appearance: Normal appearance. She is well-developed. She is not ill-appearing.  HENT:      Right Ear: Tympanic membrane normal.     Left Ear: Tympanic membrane normal.  Nose: Nose normal.     Mouth/Throat:     Mouth: Mucous membranes are moist.     Pharynx: Oropharynx is clear.  Cardiovascular:     Rate and Rhythm: Normal rate and regular rhythm.     Heart sounds: Normal heart sounds.  Pulmonary:     Effort: Pulmonary effort is normal. No respiratory distress.     Breath sounds: Normal breath sounds. No wheezing.  Musculoskeletal:     Cervical back: Neck supple.  Skin:    General: Skin is warm and dry.  Neurological:     Mental Status: She is alert.  Psychiatric:        Mood and Affect: Mood normal.        Behavior: Behavior normal.      UC Treatments / Results  Labs (all labs ordered are listed, but only abnormal results are displayed) Labs Reviewed  RESP PANEL BY RT-PCR (FLU A&B, COVID) [id number]    EKG   Radiology No results found.  Procedures Procedures (including critical care time)  Medications Ordered in UC Medications - No data to display  Initial Impression / Assessment and Plan / UC Course  I have reviewed the triage vital signs and the nursing notes.  Pertinent labs & imaging results that were available during my care of the patient were reviewed by me and considered in my medical decision making (see chart for details).   Asthma exacerbation, viral URI.  Lungs are currently clear, O2 sat 98% on room air.  She reports intermittent wheezing but has not used her albuterol inhaler.  Treating today with prednisone and encouraged patient to use her inhaler as directed.  COVID and Flu pending.  Discussed symptomatic treatment including Tylenol or ibuprofen, rest, hydration.  Instructed patient to follow up with PCP if symptoms are not improving.  She agrees to plan of care.    Final Clinical Impressions(s) / UC Diagnoses   Final diagnoses:  Asthma with acute exacerbation, unspecified asthma severity, unspecified whether persistent  Viral URI      Discharge Instructions      Take the prednisone as directed.  Use your albuterol inhaler as directed.    Your COVID and Flu tests are pending.    Take Tylenol or ibuprofen as needed for fever or discomfort.  Rest and keep yourself hydrated.    Follow-up with your primary care provider if your symptoms are not improving.         ED Prescriptions     Medication Sig Dispense Auth. Provider   predniSONE (DELTASONE) 10 MG tablet Take 4 tablets (40 mg total) by mouth daily for 5 days. 20 tablet Mickie Bail, NP      PDMP not reviewed this encounter.   Mickie Bail, NP 06/04/22 [phone]

## 2022-07-30 ENCOUNTER — Ambulatory Visit
Admission: RE | Admit: 2022-07-30 | Discharge: 2022-07-30 | Disposition: A | Discharge status: Home / Self Care | Payer: Medicaid Other | Source: Ambulatory Visit | Attending: Emergency Medicine | Admitting: Emergency Medicine

## 2022-07-30 VITALS — BP 119/88 | HR 88 | Temp 100.9°F | Resp 20

## 2022-07-30 DIAGNOSIS — B349 Viral infection, unspecified: Secondary | ICD-10-CM | POA: Insufficient documentation

## 2022-07-30 DIAGNOSIS — R6889 Other general symptoms and signs: Secondary | ICD-10-CM | POA: Diagnosis not present

## 2022-07-30 DIAGNOSIS — J45909 Unspecified asthma, uncomplicated: Secondary | ICD-10-CM | POA: Diagnosis not present

## 2022-07-30 DIAGNOSIS — R059 Cough, unspecified: Secondary | ICD-10-CM | POA: Diagnosis present

## 2022-07-30 DIAGNOSIS — F17210 Nicotine dependence, cigarettes, uncomplicated: Secondary | ICD-10-CM | POA: Diagnosis not present

## 2022-07-30 DIAGNOSIS — R55 Syncope and collapse: Secondary | ICD-10-CM | POA: Diagnosis not present

## 2022-07-30 DIAGNOSIS — Z1152 Encounter for screening for COVID-19: Secondary | ICD-10-CM | POA: Insufficient documentation

## 2022-07-30 LAB — POCT FASTING CBG KUC MANUAL ENTRY: POCT Glucose (KUC): 114 mg/dL — AB (ref 70–99)

## 2022-07-30 MED ORDER — ACETAMINOPHEN 325 MG PO TABS
650.0000 mg | ORAL_TABLET | Freq: Once | ORAL | Status: AC
  Administered 2022-07-30: 650 mg via ORAL

## 2022-07-30 MED ORDER — OSELTAMIVIR PHOSPHATE 75 MG PO CAPS: 75.0000 mg | ORAL_CAPSULE | Freq: Two times a day (BID) | ORAL | 0 refills | Status: AC

## 2022-07-30 NOTE — ED Triage Notes (Signed)
Patient to Urgent Care with complaints of headaches, generalized body aches, chills, and cough. Possible fevers.   Hx of migraines. Sensitive to light. Denies any nausea/ vomiting.  Symptoms started one day ago.

## 2022-07-30 NOTE — Discharge Instructions (Addendum)
Start the Tamiflu.    Your COVID test is pending.  If the test is positive, stop the Tamiflu.   Take Tylenol or ibuprofen as needed for fever or discomfort.  Rest and keep yourself hydrated.    Follow-up with your primary care provider if your symptoms are not improving.

## 2022-07-30 NOTE — ED Provider Notes (Addendum)
Roderic Palau    CSN: [id number] Arrival date & time: 07/30/22  1557      History   Chief Complaint Chief Complaint  Patient presents with   Cough    Migraine headache etc - Entered by patient    HPI Christy Jones is a 37 y.o. female.  Accompanied by her son, patient presents with fever, chills, body aches, headache, cough.  No rash, sore throat, shortness of breath, wheezing, vomiting, diarrhea, or other symptoms.  No OTC medications taken today.  Her medical history includes asthma.  Patient was seen here on 06/04/2022; diagnosed with asthma exacerbation and viral URI; treated with prednisone.  The history is provided by the patient and medical records.    Past Medical History:  Diagnosis Date   Asthma    Chlamydia    GERD (gastroesophageal reflux disease)    Gestational diabetes    with pregnancy x2   Headache(784.0)    Pregnancy induced hypertension    post- partem 2012   Sickle cell trait ([place])    Trichomonas    Umbilical hernia    Urinary tract infection     Patient Active Problem List   Diagnosis Date Noted   Incisional hernia, without obstruction or gangrene 02/21/2019   Rectus diastasis 02/21/2019   Ectopic pregnancy 06/20/2017   Post-op pain 06/20/2017   Cesarean delivery, delivered, current hospitalization 08/12/2016   Term birth of newborn female 02/13/2011    Past Surgical History:  Procedure Laterality Date   ABDOMINAL HYSTERECTOMY     CESAREAN SECTION N/A 08/11/2016   Procedure: CESAREAN SECTION;  Surgeon: Vanessa Kick, MD;  Location: [facility];  Service: Obstetrics;  Laterality: N/A;   CHOLECYSTECTOMY N/A 07/30/2012   Procedure: LAPAROSCOPIC CHOLECYSTECTOMY WITH INTRAOPERATIVE CHOLANGIOGRAM;  Surgeon: Adin Hector, MD;  Location: [place];  Service: General;  Laterality: N/A;   DIAGNOSTIC LAPAROSCOPY WITH REMOVAL OF ECTOPIC PREGNANCY N/A 06/20/2017   Procedure: DIAGNOSTIC LAPAROSCOPY WITH REMOVAL OF ECTOPIC PREGNANCY;   Surgeon: Lavonia Drafts, MD;  Location: [facility];  Service: Gynecology;  Laterality: N/A;   EXPLORATORY LAPAROTOMY     removal of ectopic pregnancy   FOOT SURGERY  2021   INCISIONAL HERNIA REPAIR N/A 02/25/2019   Procedure: OPEN INCISIONAL HERNIA REPAIR WITH MESH PATCH;  Surgeon: Armandina Gemma, MD;  Location: [place];  Service: General;  Laterality: N/A;    OB History     Gravida  7   Para  3   Term  3   Preterm  0   AB  3   Living  2      SAB  1   IAB  1   Ectopic  1   Multiple  0   Live Births  1            Home Medications    Prior to Admission medications   Medication Sig Start Date End Date Taking? Authorizing Provider  oseltamivir (TAMIFLU) 75 MG capsule Take 1 capsule (75 mg total) by mouth every 12 (twelve) hours. 07/30/22  Yes Sharion Balloon, NP  acetaminophen (TYLENOL) 500 MG tablet Take 1,000 mg by mouth every 6 (six) hours as needed for moderate pain.    [provider]  albuterol (PROAIR HFA) 108 (90 Base) MCG/ACT inhaler Inhale 2 puffs every 4 hours by inhalation route. 05/21/16   [provider]  IBUPROFEN PO Take by mouth.    [provider]  meloxicam (MOBIC) 15 MG tablet Take 1  tablet (15 mg total) by mouth daily. 09/04/21   Edrick Kins, DPM  oxyCODONE-acetaminophen (PERCOCET) 5-325 MG tablet Take 1 tablet by mouth every 6 (six) hours as needed for severe pain. 10/26/19   Edrick Kins, DPM  terbinafine (LAMISIL) 250 MG tablet Take 1 tablet (250 mg total) by mouth daily. Patient not taking: No sig reported 01/26/19   Edrick Kins, DPM  traMADol (ULTRAM) 50 MG tablet Take 1 tablet (50 mg total) by mouth every 6 (six) hours as needed. Patient not taking: No sig reported 05/31/19   Johnn Hai, PA-C    Family History Family History  Problem Relation Age of Onset   Asthma Maternal Grandmother    Diabetes Maternal Grandmother    Anesthesia problems Neg Hx     Social History Social  History   Tobacco Use   Smoking status: Every Day    Packs/day: 0.25    Years: 10.00    Total pack years: 2.50    Types: Cigarettes   Smokeless tobacco: Never  Vaping Use   Vaping Use: Never used  Substance Use Topics   Alcohol use: Not Currently   Drug use: No     Allergies   Lactose intolerance (gi)   Review of Systems Review of Systems  Constitutional:  Positive for chills and fever.  HENT:  Negative for ear pain and sore throat.   Respiratory:  Positive for cough. Negative for shortness of breath.   Cardiovascular:  Negative for chest pain and palpitations.  Gastrointestinal:  Negative for diarrhea and vomiting.  Skin:  Negative for color change and rash.  Neurological:  Positive for headaches.  All other systems reviewed and are negative.    Physical Exam Triage Vital Signs ED Triage Vitals [07/30/22 1615]  Enc Vitals Group     BP      Pulse Rate 85     Resp 18     Temp (!) 101.9 F (38.8 C)     Temp src      SpO2 96 %     Weight      Height      Head Circumference      Peak Flow      Pain Score      Pain Loc      Pain Edu?      Excl. in [place]?    No data found.  Updated Vital Signs BP 119/88   Pulse 88   Temp (!) 100.9 F (38.3 C)   Resp 20   LMP 05/18/2019 (Approximate)   SpO2 97%   Visual Acuity Right Eye Distance:   Left Eye Distance:   Bilateral Distance:    Right Eye Near:   Left Eye Near:    Bilateral Near:     Physical Exam Vitals and nursing note reviewed.  Constitutional:      General: She is not in acute distress.    Appearance: Normal appearance. She is well-developed. She is ill-appearing.  HENT:     Right Ear: Tympanic membrane normal.     Left Ear: Tympanic membrane normal.     Nose: Nose normal.     Mouth/Throat:     Mouth: Mucous membranes are moist.     Pharynx: Oropharynx is clear.  Cardiovascular:     Rate and Rhythm: Normal rate and regular rhythm.     Heart sounds: Normal heart sounds.  Pulmonary:      Effort: Pulmonary effort is normal. No respiratory distress.  Breath sounds: Normal breath sounds. No wheezing.  Musculoskeletal:     Cervical back: Neck supple.  Skin:    General: Skin is warm and dry.  Neurological:     Mental Status: She is alert.  Psychiatric:        Mood and Affect: Mood normal.        Behavior: Behavior normal.      UC Treatments / Results  Labs (all labs ordered are listed, but only abnormal results are displayed) Labs Reviewed  POCT FASTING CBG [facility] MANUAL ENTRY - Abnormal; Notable for the following components:      Result Value   POCT Glucose ([facility]) 114 (*)    All other components within normal limits  SARS CORONAVIRUS 2 (TAT 6-24 HRS)    EKG   Radiology No results found.  Procedures Procedures (including critical care time)  Medications Ordered in UC Medications  acetaminophen (TYLENOL) tablet 650 mg (650 mg Oral Given 07/30/22 1626)    Initial Impression / Assessment and Plan / UC Course  I have reviewed the triage vital signs and the nursing notes.  Pertinent labs & imaging results that were available during my care of the patient were reviewed by me and considered in my medical decision making (see chart for details).   Viral illness, Flu-like symptoms.  Tylenol given here.  Treating with Tamiflu.  COVID pending.  If COVID positive, recommend treatment with molnupiravir.  Discussed with patient that she should stop the Tamiflu if her COVID test is positive.  Discussed symptomatic treatment including Tylenol or ibuprofen, rest, hydration.  Instructed patient to follow up with her PCP if symptoms are not improving.  She agrees to plan of care.    When patient stood up after discharge instructions, she felt near-syncopal.  This resolved with water and sitting.  VSS, CBG 114.  Patient declines EMS.  She states she wants to go home and rest.  She is alert and oriented; gait steady.    Final Clinical Impressions(s) / UC Diagnoses   Final  diagnoses:  Viral illness  Flu-like symptoms  Near syncope     Discharge Instructions      Start the Tamiflu.    Your COVID test is pending.  If the test is positive, stop the Tamiflu.   Take Tylenol or ibuprofen as needed for fever or discomfort.  Rest and keep yourself hydrated.    Follow-up with your primary care provider if your symptoms are not improving.         ED Prescriptions     Medication Sig Dispense Auth. Provider   oseltamivir (TAMIFLU) 75 MG capsule Take 1 capsule (75 mg total) by mouth every 12 (twelve) hours. 10 capsule Sharion Balloon, NP      PDMP not reviewed this encounter.   Sharion Balloon, NP 07/30/22 Martin, NP 07/30/22 [phone]

## 2022-07-31 LAB — SARS CORONAVIRUS 2 (TAT 6-24 HRS): SARS Coronavirus 2: NEGATIVE

## 2022-08-02 ENCOUNTER — Emergency Department: Payer: Medicaid Other

## 2022-08-02 ENCOUNTER — Other Ambulatory Visit: Payer: Self-pay

## 2022-08-02 ENCOUNTER — Emergency Department
Admission: EM | Admit: 2022-08-02 | Discharge: 2022-08-02 | Disposition: A | Discharge status: Home / Self Care | Payer: Medicaid Other | Attending: Emergency Medicine | Admitting: Emergency Medicine

## 2022-08-02 DIAGNOSIS — J189 Pneumonia, unspecified organism: Secondary | ICD-10-CM | POA: Diagnosis not present

## 2022-08-02 DIAGNOSIS — R059 Cough, unspecified: Secondary | ICD-10-CM | POA: Diagnosis present

## 2022-08-02 DIAGNOSIS — Z20822 Contact with and (suspected) exposure to covid-19: Secondary | ICD-10-CM | POA: Insufficient documentation

## 2022-08-02 DIAGNOSIS — J101 Influenza due to other identified influenza virus with other respiratory manifestations: Secondary | ICD-10-CM | POA: Insufficient documentation

## 2022-08-02 DIAGNOSIS — J45909 Unspecified asthma, uncomplicated: Secondary | ICD-10-CM | POA: Diagnosis not present

## 2022-08-02 LAB — URINALYSIS, ROUTINE W REFLEX MICROSCOPIC
Bilirubin Urine: NEGATIVE
Glucose, UA: NEGATIVE mg/dL
Hgb urine dipstick: NEGATIVE
Ketones, ur: NEGATIVE mg/dL
Leukocytes,Ua: NEGATIVE
Nitrite: NEGATIVE
Protein, ur: NEGATIVE mg/dL
Specific Gravity, Urine: 1.021 (ref 1.005–1.030)
pH: 5 (ref 5.0–8.0)

## 2022-08-02 LAB — COMPREHENSIVE METABOLIC PANEL
ALT: 18 U/L (ref 0–44)
AST: 33 U/L (ref 15–41)
Albumin: 4.1 g/dL (ref 3.5–5.0)
Alkaline Phosphatase: 69 U/L (ref 38–126)
Anion gap: 11 (ref 5–15)
BUN: 6 mg/dL (ref 6–20)
CO2: 23 mmol/L (ref 22–32)
Calcium: 8.7 mg/dL — ABNORMAL LOW (ref 8.9–10.3)
Chloride: 98 mmol/L (ref 98–111)
Creatinine, Ser: 0.76 mg/dL (ref 0.44–1.00)
GFR, Estimated: >60 mL/min (ref >60)
Glucose, Bld: 96 mg/dL (ref 70–99)
Potassium: 3.7 mmol/L (ref 3.5–5.1)
Sodium: 132 mmol/L — ABNORMAL LOW (ref 135–145)
Total Bilirubin: 0.7 mg/dL (ref 0.3–1.2)
Total Protein: 7.7 g/dL (ref 6.5–8.1)

## 2022-08-02 LAB — CBC WITH DIFFERENTIAL/PLATELET
Abs Immature Granulocytes: 0.01 10*3/uL (ref 0.00–0.07)
Basophils Absolute: 0 10*3/uL (ref 0.0–0.1)
Basophils Relative: 0 %
Eosinophils Absolute: 0 10*3/uL (ref 0.0–0.5)
Eosinophils Relative: 0 %
HCT: 43.1 % (ref 36.0–46.0)
Hemoglobin: 15.7 g/dL — ABNORMAL HIGH (ref 12.0–15.0)
Immature Granulocytes: 0 %
Lymphocytes Relative: 43 %
Lymphs Abs: 2 10*3/uL (ref 0.7–4.0)
MCH: 29.5 pg (ref 26.0–34.0)
MCHC: 36.4 g/dL — ABNORMAL HIGH (ref 30.0–36.0)
MCV: 81 fL (ref 80.0–100.0)
Monocytes Absolute: 0.4 10*3/uL (ref 0.1–1.0)
Monocytes Relative: 8 %
Neutro Abs: 2.3 10*3/uL (ref 1.7–7.7)
Neutrophils Relative %: 49 %
Platelets: 129 10*3/uL — ABNORMAL LOW (ref 150–400)
RBC: 5.32 MIL/uL — ABNORMAL HIGH (ref 3.87–5.11)
RDW: 12.7 % (ref 11.5–15.5)
WBC: 4.8 10*3/uL (ref 4.0–10.5)
nRBC: 0 % (ref 0.0–0.2)

## 2022-08-02 LAB — RESP PANEL BY RT-PCR (RSV, FLU A&B, COVID)  RVPGX2
Influenza A by PCR: NEGATIVE
Influenza B by PCR: POSITIVE — AB
Resp Syncytial Virus by PCR: NEGATIVE
SARS Coronavirus 2 by RT PCR: NEGATIVE

## 2022-08-02 LAB — GROUP A STREP BY PCR: Group A Strep by PCR: NOT DETECTED

## 2022-08-02 LAB — TROPONIN I (HIGH SENSITIVITY): Troponin I (High Sensitivity): 5 ng/L (ref <18)

## 2022-08-02 MED ORDER — HYDROCOD POLI-CHLORPHE POLI ER 10-8 MG/5ML PO SUER: 5.0000 mL | Freq: Every evening | ORAL | 0 refills | Status: AC | PRN

## 2022-08-02 MED ORDER — BENZONATATE 100 MG PO CAPS: 100.0000 mg | ORAL_CAPSULE | Freq: Three times a day (TID) | ORAL | 0 refills | Status: AC | PRN

## 2022-08-02 MED ORDER — SODIUM CHLORIDE 0.9 % IV BOLUS
1000.0000 mL | Freq: Once | INTRAVENOUS | Status: AC
  Administered 2022-08-02: 1000 mL via INTRAVENOUS

## 2022-08-02 MED ORDER — SODIUM CHLORIDE 0.9 % IV SOLN
1.0000 g | Freq: Once | INTRAVENOUS | Status: AC
  Administered 2022-08-02: 1 g via INTRAVENOUS
  Filled 2022-08-02: qty 10

## 2022-08-02 MED ORDER — DOXYCYCLINE HYCLATE 100 MG PO TABS: 100.0000 mg | ORAL_TABLET | Freq: Two times a day (BID) | ORAL | 0 refills | Status: AC

## 2022-08-02 MED ORDER — ONDANSETRON HCL 4 MG/2ML IJ SOLN
4.0000 mg | Freq: Once | INTRAMUSCULAR | Status: AC
  Administered 2022-08-02: 4 mg via INTRAVENOUS
  Filled 2022-08-02: qty 2

## 2022-08-02 MED ORDER — BENZONATATE 100 MG PO CAPS
200.0000 mg | ORAL_CAPSULE | Freq: Once | ORAL | Status: AC
  Administered 2022-08-02: 200 mg via ORAL
  Filled 2022-08-02: qty 2

## 2022-08-02 MED ORDER — IPRATROPIUM-ALBUTEROL 0.5-2.5 (3) MG/3ML IN SOLN
3.0000 mL | Freq: Once | RESPIRATORY_TRACT | Status: AC
  Administered 2022-08-02: 3 mL via RESPIRATORY_TRACT
  Filled 2022-08-02: qty 3

## 2022-08-02 NOTE — Discharge Instructions (Signed)
Follow-up with your regular doctor if not improving in 2 days.  Return emergency department worsening.  Use medications as prescribed.  Drink plenty of fluids.  Try over-the-counter TheraFlu combined with these medications.

## 2022-08-02 NOTE — ED Triage Notes (Signed)
Pt presents to the ED via POV due to cough and generalized bodyaches. Pt was seen at [facility] on 2/9 for same complaint. Pt A&Ox4

## 2022-08-02 NOTE — ED Provider Notes (Signed)
[facility] Provider Note    Event Date/Time   First MD Initiated Contact with Patient 08/02/22 1106     (approximate)   History   Cough   HPI  Christy Jones is a 37 y.o. female with history of asthma, gestational diabetes, and hysterectomy presents emergency department with complaints of fever, chills, cough and bodyaches.  Some vomiting and diarrhea.  States today she noticed the dizziness and had blurred vision on the way to the emergency department.  States she just feels very weak and has not been able to eat.  Nothing is controlling the cough.  Patient is using her inhaler as prescribed.  Her son is also sick      Physical Exam   Triage Vital Signs: ED Triage Vitals  Enc Vitals Group     BP 08/02/22 1102 110/80     Pulse Rate 08/02/22 1102 79     Resp 08/02/22 1102 20     Temp 08/02/22 1102 98 F (36.7 C)     Temp Source 08/02/22 1102 Oral     SpO2 08/02/22 1102 98 %     Weight 08/02/22 1109 162 lb 0.6 oz (73.5 kg)     Height 08/02/22 1109 5' 4"$  (1.626 m)     Head Circumference --      Peak Flow --      Pain Score 08/02/22 1103 10     Pain Loc --      Pain Edu? --      Excl. in [place]? --     Most recent vital signs: Vitals:   08/02/22 1102 08/02/22 1145  BP: 110/80   Pulse: 79   Resp: 20   Temp: 98 F (36.7 C) 99.3 F (37.4 C)  SpO2: 98%      General: Awake, no distress.   CV:  Good peripheral perfusion. regular rate and  rhythm Resp:  Normal effort. Lungs CTA, cough is constant, bronchospasms noted Abd:  No distention.   Other:  Patient appears to be weak, nystagmus noted bilaterally, she tries to raise her self up to sit straight up and gets dizzy   ED Results / Procedures / Treatments   Labs (all labs ordered are listed, but only abnormal results are displayed) Labs Reviewed  RESP PANEL BY RT-PCR (RSV, FLU A&B, COVID)  RVPGX2 - Abnormal; Notable for the following components:      Result Value   Influenza B by PCR  POSITIVE (*)    All other components within normal limits  COMPREHENSIVE METABOLIC PANEL - Abnormal; Notable for the following components:   Sodium 132 (*)    Calcium 8.7 (*)    All other components within normal limits  CBC WITH DIFFERENTIAL/PLATELET - Abnormal; Notable for the following components:   RBC 5.32 (*)    Hemoglobin 15.7 (*)    MCHC 36.4 (*)    Platelets 129 (*)    All other components within normal limits  URINALYSIS, ROUTINE W REFLEX MICROSCOPIC - Abnormal; Notable for the following components:   Color, Urine YELLOW (*)    APPearance CLEAR (*)    All other components within normal limits  GROUP A STREP BY PCR  TROPONIN I (HIGH SENSITIVITY)     EKG  EKG   RADIOLOGY Chest x-ray    PROCEDURES:   Procedures   MEDICATIONS ORDERED IN ED: Medications  benzonatate (TESSALON) capsule 200 mg (has no administration in time range)  sodium chloride 0.9 % bolus 1,000  mL (0 mLs Intravenous Stopped 08/02/22 1354)  ondansetron (ZOFRAN) injection 4 mg (4 mg Intravenous Given 08/02/22 1154)  ipratropium-albuterol (DUONEB) 0.5-2.5 (3) MG/3ML nebulizer solution 3 mL (3 mLs Nebulization Given 08/02/22 1248)  cefTRIAXone (ROCEPHIN) 1 g in sodium chloride 0.9 % 100 mL IVPB (0 g Intravenous Stopped 08/02/22 1354)  sodium chloride 0.9 % bolus 1,000 mL (1,000 mLs Intravenous New Bag/Given 08/02/22 1309)     IMPRESSION / MDM / ASSESSMENT AND PLAN / ED COURSE  I reviewed the triage vital signs and the nursing notes.                              Differential diagnosis includes, but is not limited to, weakness, dizziness, dehydration, COVID, influenza, strep, pyelo-  Patient's presentation is most consistent with acute presentation with potential threat to life or bodily function.   Labs, chest x-ray, EKG due to weakness and dizziness   EKG shows normal sinus rhythm, ventricular rate of 67, PR interval 140, QRS is 88, no QT prolongation, interpret this as being normal  Chest x-ray  independently reviewed interpreted by me as having interstitial traits bilaterally indicating pneumonia.  Confirmed by radiology  Due to the pneumonia combined with influenza patient was given normal saline 1 L IV, Zofran 4 mg IV, Rocephin 1 g IV  UA appears to be normal is reassuring, feel that at this time patient would not benefit from admission.  Would like to do a trial of outpatient antibiotics and cough medication etc.  However she was given strict instructions to return emergency department worsening.  She was given Lavella Lemons Perle here in the ED to see if that would help with cough.  Tussionex, doxycycline, Tessalon Perles prescription was sent to her pharmacy.  She was given a work note.  Discharged stable condition.  FINAL CLINICAL IMPRESSION(S) / ED DIAGNOSES   Final diagnoses:  Influenza B  Community acquired pneumonia, unspecified laterality     Rx / DC Orders   ED Discharge Orders          Ordered    doxycycline (VIBRA-TABS) 100 MG tablet  2 times daily        08/02/22 1247    chlorpheniramine-HYDROcodone (TUSSIONEX) 10-8 MG/5ML  At bedtime PRN        08/02/22 1247    benzonatate (TESSALON PERLES) 100 MG capsule  3 times daily PRN        08/02/22 1247             Note:  This document was prepared using Dragon voice recognition software and may include unintentional dictation errors.    Versie Starks, PA-C 08/02/22 1403    Lucillie Garfinkel, MD 08/02/22 [phone]

## 2022-08-02 NOTE — ED Notes (Signed)
See triage note - Pt reports body aches, nausea, vomiting, and cough since Monday. Pt seen at [facility] and tested negative for COVID.

## 2022-08-06 ENCOUNTER — Emergency Department
Admission: EM | Admit: 2022-08-06 | Discharge: 2022-08-06 | Disposition: A | Discharge status: Home / Self Care | Payer: Medicaid Other | Attending: Emergency Medicine | Admitting: Emergency Medicine

## 2022-08-06 ENCOUNTER — Other Ambulatory Visit: Payer: Self-pay

## 2022-08-06 ENCOUNTER — Emergency Department: Payer: Medicaid Other

## 2022-08-06 ENCOUNTER — Encounter: Payer: Self-pay | Admitting: Emergency Medicine

## 2022-08-06 DIAGNOSIS — J40 Bronchitis, not specified as acute or chronic: Secondary | ICD-10-CM

## 2022-08-06 DIAGNOSIS — R059 Cough, unspecified: Secondary | ICD-10-CM | POA: Diagnosis present

## 2022-08-06 MED ORDER — DEXAMETHASONE SODIUM PHOSPHATE 10 MG/ML IJ SOLN
10.0000 mg | Freq: Once | INTRAMUSCULAR | Status: AC
  Administered 2022-08-06: 10 mg via INTRAMUSCULAR
  Filled 2022-08-06: qty 1

## 2022-08-06 MED ORDER — IPRATROPIUM-ALBUTEROL 0.5-2.5 (3) MG/3ML IN SOLN
3.0000 mL | Freq: Once | RESPIRATORY_TRACT | Status: AC
  Administered 2022-08-06: 3 mL via RESPIRATORY_TRACT
  Filled 2022-08-06: qty 3

## 2022-08-06 NOTE — ED Provider Notes (Signed)
[facility] Provider Note    Event Date/Time   First MD Initiated Contact with Patient 08/06/22 [phone]     (approximate)   History   URI   HPI  Christy Jones is a 37 y.o. female who presents with complaints of cough, body aches, headache.  Patient was recently diagnosed with influenza and possible atypical pneumonia, records reviewed.  She reports symptoms have not improved and her coughing seems worse.  No shortness of breath reported.  She does not smoke     Physical Exam   Triage Vital Signs: ED Triage Vitals  Enc Vitals Group     BP 08/06/22 0916 (!) 163/94     Pulse Rate 08/06/22 0912 78     Resp 08/06/22 0912 20     Temp 08/06/22 0912 97.8 F (36.6 C)     Temp Source 08/06/22 0912 Oral     SpO2 08/06/22 0912 100 %     Weight 08/06/22 0911 73.5 kg (162 lb 0.6 oz)     Height 08/06/22 0911 1.626 m (5' 4"$ )     Head Circumference --      Peak Flow --      Pain Score 08/06/22 0911 6     Pain Loc --      Pain Edu? --      Excl. in [place]? --     Most recent vital signs: Vitals:   08/06/22 0916 08/06/22 1046  BP: (!) 163/94 (!) 158/90  Pulse:  80  Resp:  18  Temp:  98 F (36.7 C)  SpO2:  100%     General: Awake, no distress.  CV:  Good peripheral perfusion.  Regular rate and rhythm Resp:  Normal effort.  Bibasilar Rales Abd:  No distention.  Other:     ED Results / Procedures / Treatments   Labs (all labs ordered are listed, but only abnormal results are displayed) Labs Reviewed - No data to display   EKG     RADIOLOGY X-ray viewed interpreted by me, consistent with atypical pneumonia    PROCEDURES:  Critical Care performed:   Procedures   MEDICATIONS ORDERED IN ED: Medications  ipratropium-albuterol (DUONEB) 0.5-2.5 (3) MG/3ML nebulizer solution 3 mL (3 mLs Nebulization Given 08/06/22 0923)  dexamethasone (DECADRON) injection 10 mg (10 mg Intramuscular Given 08/06/22 1035)     IMPRESSION / MDM / [place] / ED COURSE  I reviewed the triage vital signs and the nursing notes. Patient's presentation is most consistent with acute illness / injury with system symptoms.  Patient presents with cough, additional systems as detailed above.  Vital signs are reassuring however she has a frequent cough noted on exam.  Question bronchospasm versus worsening pneumonia, will obtain 2 view chest x-ray, will give DuoNeb.  Chest x-ray not significantly changed from prior.  Consistent with bronchitis secondary to influenza.  IM Decadron given.  Recommend continuing doxycycline that she was prescribed during last visit.  Appropriate for discharge, no indication for admission.  Notably patient complained to nurse that we "do not know what we are doing and that she will be going to outside emergency department.  It appears that she was not pleased with her visit      FINAL CLINICAL IMPRESSION(S) / ED DIAGNOSES   Final diagnoses:  Bronchitis     Rx / DC Orders   ED Discharge Orders     None        Note:  This document  was prepared using Systems analyst and may include unintentional dictation errors.   Lavonia Drafts, MD 08/06/22 1051

## 2022-08-06 NOTE — ED Triage Notes (Signed)
Cough,h/a body aches since last monday

## 2022-08-06 NOTE — ED Notes (Signed)
See triage note  Presents with cont'd cough and body aches   States she was seen last week  but states meds are not working

## 2022-12-02 NOTE — ED Provider Notes (Signed)
 [facility] Emergency Department Provider Note    ED Clinical Impression   Final diagnoses:  Atypical pneumonia (Primary)    ED Assessment/Plan  Christy Jones 37 y.o. patient who  has no past medical history on file. she presents with persistent cough that causes diaphoresis over the last week.  Patient denies any fevers or chills.  Chest x-ray was obtained and shows bilateral perihilar opacities concerning for atypical pneumonia versus edema.  However patient is stating that her cough is productive with purulent thick secretions which is inconsistent with pulmonary edema.  Therefore I feel patient symptoms is more likely due to an atypical pneumonia.  Therefore we will treat with a course of doxycycline  and azithromycin.  Patient was given an albuterol  treatment in clinic with some improvement in her cough.  We will continue to treat with course of prednisone .  Patient has some concerns about the cost of her medications therefore we have sent her prescription to the outpatient pharmacy here at [facility].  Patient was given her first dose of antibiotics and steroids in the ER today and will pick up her medications tomorrow for continued treatment.    Discussion of Management with other Physicians, QHP or Appropriate Source:  N/A Independent Interpretation of Studies: EKG  N/A; RAD Chest XR concerning for bilateral perihilar opacities likely due to atypical pneumonia , POCUS  N/A External Records Reviewed: n/a Escalation of Care, Consideration of Admission/Observation/Transfer:  N/A Social determinants that significantly affected care: None applicable Prescription drug(s) considered but not prescribed:  Diagnostic tests considered but not performed:  History obtained from other sources: None        History   Chief Complaint  Patient presents with  . Cough   Christy Jones is a 37 year old female who presents to the emergency department for evaluation of a  cough that she has had over the last 2 weeks.  Patient states that they recently began work in her apartment for a water leak.  She endorses starting with a persistent cough after a hole was cut out of the wall.  Patient denies any other sick contacts.  She lives at home with her son who is not ill at this time.  Patient is not currently working and does not know where she could have obtained a respiratory infection.  She does note that she had influenza and pneumonia in the months of February but denies any acute illness since then.  She does endorse a smoking history.  But denies any chronic cough.  Patient does also have history of asthma and has albuterol  treatments at home.  Patient endorses taking her albuterol  with no improvement in her symptoms.    No past medical history on file.  No past surgical history on file.  No family history on file.     Current Facility-Administered Medications  Medication Dose Route Frequency Provider Last Rate Last Admin  . azithromycin (ZITHROMAX) tablet 500 mg  500 mg Oral Once Jackson, Sharonda Latecha, PA      . doxycycline  (VIBRA -TABS) tablet 100 mg  100 mg Oral Once Jackson, Sharonda Latecha, PA      . predniSONE  (DELTASONE ) tablet 40 mg  40 mg Oral Once Jackson, Sharonda Latecha, PA       Current Outpatient Medications  Medication Sig Dispense Refill  . azithromycin (ZITHROMAX Z-PAK) 250 MG tablet Take 1 tablet (250 mg total) by mouth daily for 4 days. 2 by mouth today the 1 by mouth daily for 4 days 4 tablet  0  . doxycycline  (VIBRAMYCIN ) 100 MG capsule Take 1 capsule (100 mg total) by mouth two (2) times a day for 7 days. 14 capsule 0  . predniSONE  (DELTASONE ) 20 MG tablet Take 2 tablets (40 mg total) by mouth daily for 4 days. 8 tablet 0      Physical Exam   BP 113/92   Pulse 90   Temp 37 C (98.6 F)   Resp 22   Wt 77.1 kg (170 lb)   SpO2 97%   Physical Exam Vitals and nursing note reviewed.  Constitutional:      Appearance: Normal  appearance.  HENT:     Head: Normocephalic and atraumatic.     Nose: Nose normal.     Mouth/Throat:     Mouth: Mucous membranes are moist.     Pharynx: Oropharynx is clear.  Eyes:     Extraocular Movements: Extraocular movements intact.     Conjunctiva/sclera: Conjunctivae normal.     Pupils: Pupils are equal, round, and reactive to light.  Cardiovascular:     Rate and Rhythm: Normal rate and regular rhythm.     Pulses: Normal pulses.     Heart sounds: Normal heart sounds.  Pulmonary:     Effort: Pulmonary effort is normal. No respiratory distress.     Breath sounds: No stridor. Wheezing and rhonchi present. No rales.  Chest:     Chest wall: No tenderness.  Abdominal:     General: There is no distension.     Palpations: Abdomen is soft. There is no mass.  Musculoskeletal:        General: Normal range of motion.  Skin:    General: Skin is warm and dry.     Capillary Refill: Capillary refill takes less than 2 seconds.  Neurological:     General: No focal deficit present.     Mental Status: She is alert and oriented to person, place, and time.  Psychiatric:        Mood and Affect: Mood normal.        Behavior: Behavior normal.        Thought Content: Thought content normal.        Judgment: Judgment normal.                Christy Jones, [place] 12/13/22 [phone]

## 2023-03-05 ENCOUNTER — Ambulatory Visit: Payer: Self-pay

## 2023-06-23 ENCOUNTER — Ambulatory Visit (HOSPITAL_COMMUNITY): Payer: Medicaid Other

## 2023-10-12 ENCOUNTER — Ambulatory Visit

## 2024-01-20 ENCOUNTER — Other Ambulatory Visit: Payer: Self-pay | Admitting: Medical Genetics

## 2024-02-27 ENCOUNTER — Ambulatory Visit
Admission: EM | Admit: 2024-02-27 | Discharge: 2024-02-27 | Disposition: A | Discharge status: Home / Self Care | Attending: Emergency Medicine | Admitting: Emergency Medicine

## 2024-02-27 ENCOUNTER — Ambulatory Visit: Payer: Self-pay

## 2024-02-27 DIAGNOSIS — N898 Other specified noninflammatory disorders of vagina: Secondary | ICD-10-CM

## 2024-02-27 DIAGNOSIS — R350 Frequency of micturition: Secondary | ICD-10-CM

## 2024-02-27 DIAGNOSIS — Z113 Encounter for screening for infections with a predominantly sexual mode of transmission: Secondary | ICD-10-CM | POA: Diagnosis not present

## 2024-02-27 LAB — POCT URINE DIPSTICK
Bilirubin, UA: NEGATIVE
Blood, UA: NEGATIVE
Glucose, UA: NEGATIVE mg/dL
Ketones, POC UA: NEGATIVE mg/dL
Leukocytes, UA: NEGATIVE
Nitrite, UA: NEGATIVE
Spec Grav, UA: >=1.03 — AB (ref 1.010–1.025)
Urobilinogen, UA: 1 U/dL
pH, UA: 6 (ref 5.0–8.0)

## 2024-02-27 MED ORDER — METRONIDAZOLE 0.75 % VA GEL: 1.0000 | Freq: Every day | VAGINAL | 0 refills | Status: AC

## 2024-02-27 NOTE — Discharge Instructions (Addendum)
Use the Metro Gel as directed.      Your vaginal tests are pending.  If your test results are positive, we will call you.  You and your sexual partner(s) may require treatment at that time.  Do not have sexual activity for at least 7 days.    Follow up with your primary care provider if your symptoms are not improving.

## 2024-02-27 NOTE — ED Triage Notes (Signed)
 Sx 1 month  Patient states that she has some discharge, fishy smelly and vaginal itching. Urinary frequency.

## 2024-02-27 NOTE — ED Provider Notes (Signed)
 [facility]    CSN: [id number] Arrival date & time: 02/27/24  9085      History   Chief Complaint Chief Complaint  Patient presents with   Vaginal Itching   Vaginal Discharge   Urinary Frequency    HPI Christy Jones is a 38 y.o. female.  Patient presents with 1 month history of malodorous vaginal discharge.  She also reports urinary frequency.  She requests STD testing as well as testing for BV and yeast.  No fever, rash, sores, dysuria, hematuria, abdominal pain, pelvic pain, flank pain.  No OTC treatments.  The history is provided by the patient and medical records.    Past Medical History:  Diagnosis Date   Asthma    Chlamydia    GERD (gastroesophageal reflux disease)    Gestational diabetes    with pregnancy x2   Headache(784.0)    Pregnancy induced hypertension    post- partem 2012   Sickle cell trait (HCC)    Trichomonas    Umbilical hernia    Urinary tract infection     Patient Active Problem List   Diagnosis Date Noted   Incisional hernia, without obstruction or gangrene 02/21/2019   Rectus diastasis 02/21/2019   Ectopic pregnancy 06/20/2017   Post-op pain 06/20/2017   Cesarean delivery, delivered, current hospitalization 08/12/2016   Term birth of newborn female 02/13/2011    Past Surgical History:  Procedure Laterality Date   ABDOMINAL HYSTERECTOMY     CESAREAN SECTION N/A 08/11/2016   Procedure: CESAREAN SECTION;  Surgeon: Marjorie Gull, MD;  Location: [facility] BIRTHING SUITES;  Service: Obstetrics;  Laterality: N/A;   CHOLECYSTECTOMY N/A 07/30/2012   Procedure: LAPAROSCOPIC CHOLECYSTECTOMY WITH INTRAOPERATIVE CHOLANGIOGRAM;  Surgeon: Elon CHRISTELLA Pacini, MD;  Location: [facility] OR;  Service: General;  Laterality: N/A;   DIAGNOSTIC LAPAROSCOPY WITH REMOVAL OF ECTOPIC PREGNANCY N/A 06/20/2017   Procedure: DIAGNOSTIC LAPAROSCOPY WITH REMOVAL OF ECTOPIC PREGNANCY;  Surgeon: Corene Coy, MD;  Location: [facility] ORS;  Service: Gynecology;  Laterality:  N/A;   EXPLORATORY LAPAROTOMY     removal of ectopic pregnancy   FOOT SURGERY  2021   INCISIONAL HERNIA REPAIR N/A 02/25/2019   Procedure: OPEN INCISIONAL HERNIA REPAIR WITH MESH PATCH;  Surgeon: Eletha Boas, MD;  Location:  SURGERY CENTER;  Service: General;  Laterality: N/A;    OB History     Gravida  7   Para  3   Term  3   Preterm  0   AB  3   Living  2      SAB  1   IAB  1   Ectopic  1   Multiple  0   Live Births  1            Home Medications    Prior to Admission medications   Medication Sig Start Date End Date Taking? Authorizing Provider  metroNIDAZOLE  (METROGEL ) 0.75 % vaginal gel Place 1 Applicatorful vaginally at bedtime for 5 days. 02/27/24 03/03/24 Yes Corlis Burnard DEL, NP  acetaminophen  (TYLENOL ) 500 MG tablet Take 1,000 mg by mouth every 6 (six) hours as needed for moderate pain.    [provider]  albuterol  (PROAIR  HFA) 108 (90 Base) MCG/ACT inhaler Inhale 2 puffs every 4 hours by inhalation route. 05/21/16   [provider]  chlorpheniramine-HYDROcodone  (TUSSIONEX) 10-8 MG/5ML Take 5 mLs by mouth at bedtime as needed for cough. 08/02/22   Fisher, Devere ORN, PA-C  doxycycline  (VIBRA -TABS) 100 MG tablet Take 1 tablet (100  mg total) by mouth 2 (two) times daily. 08/02/22   Gasper Devere ORN, PA-C  IBUPROFEN  PO Take by mouth.    [provider]  meloxicam  (MOBIC ) 15 MG tablet Take 1 tablet (15 mg total) by mouth daily. 09/04/21   Janit Thresa HERO, DPM  oseltamivir  (TAMIFLU ) 75 MG capsule Take 1 capsule (75 mg total) by mouth every 12 (twelve) hours. 07/30/22   Corlis Burnard DEL, NP  oxyCODONE -acetaminophen  (PERCOCET) 5-325 MG tablet Take 1 tablet by mouth every 6 (six) hours as needed for severe pain. 10/26/19   Janit Thresa HERO, DPM  terbinafine  (LAMISIL ) 250 MG tablet Take 1 tablet (250 mg total) by mouth daily. Patient not taking: No sig reported 01/26/19   Janit Thresa HERO, DPM  traMADol  (ULTRAM ) 50 MG tablet Take 1 tablet (50 mg  total) by mouth every 6 (six) hours as needed. Patient not taking: No sig reported 05/31/19   Saunders Shona CROME, PA-C    Family History Family History  Problem Relation Age of Onset   Asthma Maternal Grandmother    Diabetes Maternal Grandmother    Anesthesia problems Neg Hx     Social History Social History   Tobacco Use   Smoking status: Every Day    Current packs/day: 0.25    Average packs/day: 0.3 packs/day for 10.0 years (2.5 ttl pk-yrs)    Types: Cigarettes   Smokeless tobacco: Never  Vaping Use   Vaping status: Never Used  Substance Use Topics   Alcohol use: Not Currently   Drug use: No     Allergies   Lactose intolerance (gi)   Review of Systems Review of Systems  Constitutional:  Negative for chills and fever.  Gastrointestinal:  Negative for abdominal pain.  Genitourinary:  Positive for frequency and vaginal discharge. Negative for dysuria, hematuria and pelvic pain.  Skin:  Negative for color change and rash.     Physical Exam Triage Vital Signs ED Triage Vitals [02/27/24 0932]  Encounter Vitals Group     BP      Girls Systolic BP Percentile      Girls Diastolic BP Percentile      Boys Systolic BP Percentile      Boys Diastolic BP Percentile      Pulse      Resp      Temp      Temp src      SpO2      Weight 165 lb (74.8 kg)     Height      Head Circumference      Peak Flow      Pain Score 0     Pain Loc      Pain Education      Exclude from Growth Chart    No data found.  Updated Vital Signs BP 121/81 (BP Location: Left Arm)   Pulse 71   Temp 99.1 F (37.3 C) (Oral)   Resp 18   Wt 165 lb (74.8 kg)   LMP 05/18/2019 (Approximate)   SpO2 97%   BMI 28.32 kg/m   Visual Acuity Right Eye Distance:   Left Eye Distance:   Bilateral Distance:    Right Eye Near:   Left Eye Near:    Bilateral Near:     Physical Exam Constitutional:      General: She is not in acute distress. HENT:     Mouth/Throat:     Mouth: Mucous membranes  are moist.  Cardiovascular:     Rate and  Rhythm: Normal rate and regular rhythm.  Pulmonary:     Effort: Pulmonary effort is normal. No respiratory distress.  Abdominal:     General: Bowel sounds are normal.     Palpations: Abdomen is soft.     Tenderness: There is no abdominal tenderness. There is no right CVA tenderness, left CVA tenderness, guarding or rebound.  Genitourinary:    Comments: Patient declines GU exam. Neurological:     Mental Status: She is alert.      UC Treatments / Results  Labs (all labs ordered are listed, but only abnormal results are displayed) Labs Reviewed  POCT URINE DIPSTICK - Abnormal; Notable for the following components:      Result Value   Spec Grav, UA >=1.030 (*)    Protein Ur, POC trace (*)    All other components within normal limits  CERVICOVAGINAL ANCILLARY ONLY    EKG   Radiology No results found.  Procedures Procedures (including critical care time)  Medications Ordered in UC Medications - No data to display  Initial Impression / Assessment and Plan / UC Course  I have reviewed the triage vital signs and the nursing notes.  Pertinent labs & imaging results that were available during my care of the patient were reviewed by me and considered in my medical decision making (see chart for details).    Vaginal discharge, STD testing, urinary frequency.  Urine does not indicate infection.  Patient obtained vaginal self swab for testing.  Treating with MetroGel  (patient prefers gel instead of oral).  Discussed that we will call if additional treatment is needed.  Discussed that sexual partner(s) may also require treatment.  Instructed patient to abstain from sexual activity for at least 7 days.  Instructed her to follow-up with her PCP or gynecologist if her symptoms are not improving.  Patient agrees to plan of care.   Final Clinical Impressions(s) / UC Diagnoses   Final diagnoses:  Vaginal discharge  Screening for STD (sexually  transmitted disease)  Urinary frequency     Discharge Instructions      Use the MetroGel  as directed.   Your vaginal tests are pending.  If your test results are positive, we will call you.  You and your sexual partner(s) may require treatment at that time.  Do not have sexual activity for at least 7 days.    Follow-up with your primary care provider if your symptoms are not improving.       ED Prescriptions     Medication Sig Dispense Auth. Provider   metroNIDAZOLE  (METROGEL ) 0.75 % vaginal gel Place 1 Applicatorful vaginally at bedtime for 5 days. 50 g Corlis Burnard DEL, NP      PDMP not reviewed this encounter.   Corlis Burnard DEL, NP 02/27/24 [phone]

## 2024-03-01 ENCOUNTER — Ambulatory Visit (HOSPITAL_COMMUNITY): Payer: Self-pay

## 2024-03-01 LAB — CERVICOVAGINAL ANCILLARY ONLY
Bacterial Vaginitis (gardnerella): POSITIVE — AB
Candida Glabrata: NEGATIVE
Candida Vaginitis: NEGATIVE
Chlamydia: NEGATIVE
Comment: NEGATIVE
Comment: NEGATIVE
Comment: NEGATIVE
Comment: NEGATIVE
Comment: NEGATIVE
Comment: NORMAL
Neisseria Gonorrhea: NEGATIVE
Trichomonas: POSITIVE — AB

## 2024-03-01 MED ORDER — METRONIDAZOLE 500 MG PO TABS: 500.0000 mg | ORAL_TABLET | Freq: Two times a day (BID) | ORAL | 0 refills | Status: AC

## 2024-03-08 ENCOUNTER — Emergency Department

## 2024-03-08 ENCOUNTER — Emergency Department
Admission: EM | Admit: 2024-03-08 | Discharge: 2024-03-08 | Disposition: A | Discharge status: Home / Self Care | Attending: Emergency Medicine | Admitting: Emergency Medicine

## 2024-03-08 ENCOUNTER — Other Ambulatory Visit: Payer: Self-pay

## 2024-03-08 DIAGNOSIS — W109XXA Fall (on) (from) unspecified stairs and steps, initial encounter: Secondary | ICD-10-CM | POA: Insufficient documentation

## 2024-03-08 DIAGNOSIS — M79675 Pain in left toe(s): Secondary | ICD-10-CM | POA: Diagnosis present

## 2024-03-08 DIAGNOSIS — S90112A Contusion of left great toe without damage to nail, initial encounter: Secondary | ICD-10-CM | POA: Insufficient documentation

## 2024-03-08 DIAGNOSIS — J45909 Unspecified asthma, uncomplicated: Secondary | ICD-10-CM | POA: Insufficient documentation

## 2024-03-08 DIAGNOSIS — S92415A Nondisplaced fracture of proximal phalanx of left great toe, initial encounter for closed fracture: Secondary | ICD-10-CM

## 2024-03-08 MED ORDER — HYDROCODONE-ACETAMINOPHEN 5-325 MG PO TABS: 1.0000 | ORAL_TABLET | ORAL | 0 refills | Status: AC | PRN

## 2024-03-08 NOTE — ED Triage Notes (Signed)
 Pt comes with fall that happened on Friday. Pt states she injured her left toe. Pt has brace on from a past surgery and has been using it.

## 2024-03-08 NOTE — Discharge Instructions (Addendum)
 Please call the number provided for orthopedics to arrange a follow-up appointment.  Please wear your fracture boot with weightbearing as tolerated until cleared by orthopedics.

## 2024-03-08 NOTE — ED Provider Notes (Signed)
 [facility] Provider Note    Event Date/Time   First MD Initiated Contact with Patient 03/08/24 [phone]     (approximate)  History   Chief Complaint: Fall  HPI  Christy Jones is a 38 y.o. female with a past medical history of asthma, gastric reflux, presents to the emergency department for left great toe pain.  According to the patient on Friday she was going down some steps when her foot gave out on her and she tripped falling to the ground.  Patient states she hit her left great toe and has had significant pain in left great toe ever since.  Patient has a history of a prior left ankle surgery back in 2021 per patient.  She has a fracture boot which she has been wearing for the left great toe pain.  Patient also states some slight pain to her right shoulder.  No LOC.  No other injuries.  Physical Exam   Triage Vital Signs: ED Triage Vitals  Encounter Vitals Group     BP 03/08/24 0729 122/86     Girls Systolic BP Percentile --      Girls Diastolic BP Percentile --      Boys Systolic BP Percentile --      Boys Diastolic BP Percentile --      Pulse Rate 03/08/24 0729 81     Resp 03/08/24 0729 18     Temp 03/08/24 0729 98 F (36.7 C)     Temp src --      SpO2 03/08/24 0729 98 %     Weight 03/08/24 0728 166 lb (75.3 kg)     Height 03/08/24 0728 5' 4 (1.626 m)     Head Circumference --      Peak Flow --      Pain Score 03/08/24 0728 8     Pain Loc --      Pain Education --      Exclude from Growth Chart --     Most recent vital signs: Vitals:   03/08/24 0729  BP: 122/86  Pulse: 81  Resp: 18  Temp: 98 F (36.7 C)  SpO2: 98%    General: Awake, no distress.  CV:  Good peripheral perfusion.   Resp:  Normal effort.   Abd:  No distention.   Other:  Patient does have mild ecchymosis at the base of the left great toe with tenderness to the left great toe.  Remainder of the foot is benign.   ED Results / Procedures / Treatments    MEDICATIONS  ORDERED IN ED: Medications - No data to display   IMPRESSION / MDM / ASSESSMENT AND PLAN / ED COURSE  I reviewed the triage vital signs and the nursing notes.  Patient's presentation is most consistent with acute illness / injury with system symptoms.  Patient presents the emergency department for left great toe pain after a fall on Friday.  Does have mild ecchymosis to the area as well as tenderness we will obtain an x-ray of the great toe.  Patient has great range of motion in the right upper extremity no concern for shoulder fracture or dislocation.  I have reviewed interpreted the x-ray images.  Patient appears to have a fracture through her proximal phalanx. Radiology confirms oblique fracture through proximal phalanx of the great toe.  Will have the patient continue to wear her fracture boot that she is currently wearing have the patient follow-up with orthopedics.  Weightbearing as tolerated.  Patient agreeable to plan.  FINAL CLINICAL IMPRESSION(S) / ED DIAGNOSES   Fall Left great toe pain   Note:  This document was prepared using Dragon voice recognition software and may include unintentional dictation errors.   Dorothyann Drivers, MD 03/08/24 [phone]

## 2024-04-04 ENCOUNTER — Other Ambulatory Visit: Payer: Self-pay | Admitting: Medical Genetics

## 2024-04-04 DIAGNOSIS — Z006 Encounter for examination for normal comparison and control in clinical research program: Secondary | ICD-10-CM
